# Patient Record
Sex: Female | Born: 1960
Health system: Southern US, Community
[De-identification: ages and names within clinical notes are randomized; demographics above are authoritative.]

## PROBLEM LIST (undated history)

## (undated) DIAGNOSIS — M1990 Unspecified osteoarthritis, unspecified site: Secondary | ICD-10-CM

## (undated) DIAGNOSIS — E0590 Thyrotoxicosis, unspecified without thyrotoxic crisis or storm: Secondary | ICD-10-CM

## (undated) DIAGNOSIS — E079 Disorder of thyroid, unspecified: Secondary | ICD-10-CM

## (undated) DIAGNOSIS — D649 Anemia, unspecified: Secondary | ICD-10-CM

## (undated) DIAGNOSIS — G4730 Sleep apnea, unspecified: Secondary | ICD-10-CM

## (undated) DIAGNOSIS — N611 Abscess of the breast and nipple: Secondary | ICD-10-CM

## (undated) DIAGNOSIS — R011 Cardiac murmur, unspecified: Secondary | ICD-10-CM

## (undated) DIAGNOSIS — E039 Hypothyroidism, unspecified: Secondary | ICD-10-CM

## (undated) DIAGNOSIS — M810 Age-related osteoporosis without current pathological fracture: Secondary | ICD-10-CM

## (undated) HISTORY — DX: Unspecified osteoarthritis, unspecified site: M19.90

## (undated) HISTORY — DX: Disorder of thyroid, unspecified: E07.9

## (undated) HISTORY — DX: Age-related osteoporosis without current pathological fracture: M81.0

## (undated) HISTORY — PX: APPENDECTOMY: SHX54

---

## 2010-04-15 ENCOUNTER — Emergency Department (HOSPITAL_COMMUNITY): Admission: EM | Admit: 2010-04-15 | Discharge: 2010-04-15 | Payer: Self-pay | Admitting: Emergency Medicine

## 2010-10-14 ENCOUNTER — Emergency Department (HOSPITAL_COMMUNITY)
Admission: EM | Admit: 2010-10-14 | Discharge: 2010-10-15 | Payer: Self-pay | Source: Home / Self Care | Admitting: Emergency Medicine

## 2010-10-16 LAB — RAPID STREP SCREEN (MED CTR MEBANE ONLY): Streptococcus, Group A Screen (Direct): NEGATIVE

## 2011-01-16 ENCOUNTER — Emergency Department (HOSPITAL_COMMUNITY)
Admission: EM | Admit: 2011-01-16 | Discharge: 2011-01-16 | Disposition: A | Discharge status: Home / Self Care | Payer: Self-pay | Attending: Emergency Medicine | Admitting: Emergency Medicine

## 2011-01-16 DIAGNOSIS — S40269A Insect bite (nonvenomous) of unspecified shoulder, initial encounter: Secondary | ICD-10-CM | POA: Insufficient documentation

## 2011-01-16 DIAGNOSIS — W57XXXA Bitten or stung by nonvenomous insect and other nonvenomous arthropods, initial encounter: Secondary | ICD-10-CM | POA: Insufficient documentation

## 2014-10-25 ENCOUNTER — Telehealth: Payer: Self-pay | Admitting: Family Medicine

## 2014-10-26 NOTE — Telephone Encounter (Signed)
Pt needs to establish care, pt aware to arrive 15 minutes prior to appt with insurance card and a list of current medications, pt has been using the ER whenever she has problems. Pt given appt with dr Sabra Heck 1/28 at 1:00.

## 2014-10-28 ENCOUNTER — Encounter: Payer: Self-pay | Admitting: Family Medicine

## 2014-10-28 ENCOUNTER — Ambulatory Visit (INDEPENDENT_AMBULATORY_CARE_PROVIDER_SITE_OTHER): Payer: 59 | Admitting: Family Medicine

## 2014-10-28 ENCOUNTER — Encounter (INDEPENDENT_AMBULATORY_CARE_PROVIDER_SITE_OTHER): Payer: Self-pay

## 2014-10-28 VITALS — BP 118/73 | HR 60 | Temp 97.5°F | Ht 64.0 in | Wt 236.0 lb

## 2014-10-28 DIAGNOSIS — Z0000 Encounter for general adult medical examination without abnormal findings: Secondary | ICD-10-CM

## 2014-10-28 NOTE — Progress Notes (Signed)
   Subjective:    Patient ID: Makayla Gibson, female    DOB: 1960-10-30, 54 y.o.   MRN: [id number]  HPI  54 year old female comes in today to establish care. She is new to our practice and brought a few records with her. She needs a physical exam and forms filled out that will allow her to return to work.  She has worked as a Psychologist, counselling at [employer]. When she is asked to do a lot of walking and heavy lifting of patients she has pain in her back and feet and legs. Most of the time she works in [employer] and is comfortable doing that job. She brings with her job description and we have been over these forms. She has assisted me and telling me what she is comfortable doing in my exam basically supports her history and limitations.   Outpatient Encounter Prescriptions as of 10/28/2014  Medication Sig  . Multiple Vitamin (MULTIVITAMIN WITH MINERALS) TABS tablet Take 1 tablet by mouth daily.    Review of Systems  Constitutional: Negative.   Cardiovascular: Negative.   Gastrointestinal: Negative.   Musculoskeletal: Positive for myalgias and back pain.  Neurological: Negative.   Psychiatric/Behavioral: Negative.        Objective:   Physical Exam  Constitutional: She is oriented to person, place, and time. She appears well-developed and well-nourished.  HENT:  Head: Normocephalic.  Eyes: Pupils are equal, round, and reactive to light.  Neck: Normal range of motion. Neck supple.  Cardiovascular: Normal rate and regular rhythm.   Pulmonary/Chest: Effort normal and breath sounds normal.  Musculoskeletal: Normal range of motion.  Neurological: She is alert and oriented to person, place, and time.  Skin: Skin is warm and dry.  Psychiatric: She has a normal mood and affect. Her behavior is normal. Judgment and thought content normal.    BP 118/73 mmHg  Pulse 60  Temp(Src) 97.5 F (36.4 C) (Oral)  Ht 5\' 4"  (1.626 m)  Wt 236 lb (107.049 kg)  BMI 40.49 kg/m2         Assessment & Plan:  1. 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 We went through forms and decide what she is afebrile and not able to do based on her history. She is amazingly free of chronic diseases. She tells me she's not had Pap smear or mammogram like to get her set up for that in the near future  Wardell Honour MD

## 2014-11-30 DIAGNOSIS — E079 Disorder of thyroid, unspecified: Secondary | ICD-10-CM

## 2014-11-30 HISTORY — DX: Disorder of thyroid, unspecified: E07.9

## 2015-01-10 DIAGNOSIS — J9612 Chronic respiratory failure with hypercapnia: Secondary | ICD-10-CM | POA: Insufficient documentation

## 2015-01-12 ENCOUNTER — Ambulatory Visit (INDEPENDENT_AMBULATORY_CARE_PROVIDER_SITE_OTHER): Payer: 59 | Admitting: Family Medicine

## 2015-01-12 ENCOUNTER — Encounter: Payer: Self-pay | Admitting: Family Medicine

## 2015-01-12 VITALS — BP 117/74 | HR 81 | Temp 98.8°F | Ht 64.0 in | Wt 216.4 lb

## 2015-01-12 DIAGNOSIS — R7989 Other specified abnormal findings of blood chemistry: Secondary | ICD-10-CM | POA: Diagnosis not present

## 2015-01-12 DIAGNOSIS — E038 Other specified hypothyroidism: Secondary | ICD-10-CM | POA: Diagnosis not present

## 2015-01-12 DIAGNOSIS — R945 Abnormal results of liver function studies: Secondary | ICD-10-CM

## 2015-01-12 DIAGNOSIS — R160 Hepatomegaly, not elsewhere classified: Secondary | ICD-10-CM

## 2015-01-12 DIAGNOSIS — D509 Iron deficiency anemia, unspecified: Secondary | ICD-10-CM | POA: Diagnosis not present

## 2015-01-12 LAB — POCT CBC
Granulocyte percent: 71.8 %G (ref 37–80)
HCT, POC: 38 % (ref 37.7–47.9)
Hemoglobin: 11.5 g/dL — AB (ref 12.2–16.2)
Lymph, poc: 1.5 (ref 0.6–3.4)
MCH, POC: 29.1 pg (ref 27–31.2)
MCHC: 30.3 g/dL — AB (ref 31.8–35.4)
MCV: 96.1 fL (ref 80–97)
MPV: 7.4 fL (ref 0–99.8)
POC Granulocyte: 5.2 (ref 2–6.9)
POC LYMPH PERCENT: 20.7 %L (ref 10–50)
Platelet Count, POC: 335 10*3/uL (ref 142–424)
RBC: 3.96 M/uL — AB (ref 4.04–5.48)
RDW, POC: 16 %
WBC: 7.3 10*3/uL (ref 4.6–10.2)

## 2015-01-12 NOTE — Progress Notes (Signed)
Subjective:  Patient ID: Makayla Gibson, female    DOB: Jan 30, 1961  Age: 54 y.o. MRN: [id number]  CC: Hospitalization Follow-up   HPI VERDINE Gibson presents for recent hospital admission. Apparently she became obtunded and combative in the home on about March 25. She also became delirious and was taken to the hospital at [facility]. From there she was transferred to [facility]. There she was treated for an underactive thyroid. She was also found to have significant anemia. She was given transfusion and started on iron. She also is taking thyroid medication currently. While at [facility] she received a CT angiogram of her chest which showed no sign of pulmonary embolus however she was noted to have pleural effusions and ascites as well as body wall edema indicative of anasarca. She had an abdominal ultrasound showing hepatomegaly. The CT scan showed some nodularity in the liver but no conclusive evidence for tumor. A follow-up CT hepatic protocol was recommended on that test. Review of [facility] records also revealed hemoglobin down to 8.  Currently her thyroid has stabilized on current medication titrated at [facility]. Her energy is better but still poor. The delirium resolved while in the hospital and she is lucid with no complaint of confusion currently.   History [name] has a past medical history of Arthritis.   She has past surgical history that includes Cesarean section.   Her family history includes Alcohol abuse in her father; Cancer in her brother; Heart disease in her mother; Osteoporosis in her brother; Rheumatic fever in her mother.She reports that she quit smoking about 6 years ago. She has never used smokeless tobacco. She reports that she does not drink alcohol or use illicit drugs.  Current Outpatient Prescriptions on File Prior to Visit  Medication Sig Dispense Refill  . Multiple Vitamin (MULTIVITAMIN WITH MINERALS) TABS tablet Take 1 tablet by mouth daily.      No current facility-administered medications on file prior to visit.    ROS Review of Systems  Constitutional: Negative for fever, chills, diaphoresis, appetite change, fatigue and unexpected weight change.  HENT: Negative for congestion, ear pain, hearing loss, postnasal drip, rhinorrhea, sneezing, sore throat and trouble swallowing.   Eyes: Negative for pain.  Respiratory: Negative for cough, chest tightness and shortness of breath.   Cardiovascular: Negative for chest pain and palpitations.  Gastrointestinal: Negative for nausea, vomiting, abdominal pain, diarrhea and constipation.  Genitourinary: Negative for dysuria, frequency and menstrual problem.  Musculoskeletal: Negative for joint swelling and arthralgias.  Skin: Negative for rash.  Neurological: Negative for dizziness, weakness, numbness and headaches.  Psychiatric/Behavioral: Negative for dysphoric mood and agitation.    Objective:  BP 117/74 mmHg  Pulse 81  Temp(Src) 98.8 F (37.1 C) (Oral)  Ht $R'5\' 4"'wg$  (1.626 m)  Wt 216 lb 6.4 oz (98.158 kg)  BMI 37.13 kg/m2  BP Readings from Last 3 Encounters:  01/12/15 117/74  10/28/14 118/73    Wt Readings from Last 3 Encounters:  01/12/15 216 lb 6.4 oz (98.158 kg)  10/28/14 236 lb (107.049 kg)     Physical Exam  Constitutional: She is oriented to person, place, and time. She appears well-developed and well-nourished. No distress.  HENT:  Head: Normocephalic and atraumatic.  Right Ear: External ear normal.  Left Ear: External ear normal.  Nose: Nose normal.  Mouth/Throat: Oropharynx is clear and moist.  Eyes: Conjunctivae and EOM are normal. Pupils are equal, round, and reactive to light.  Neck: Normal range of motion. Neck supple.  No thyromegaly present.  Cardiovascular: Normal rate, regular rhythm and normal heart sounds.   No murmur heard. Pulmonary/Chest: Effort normal and breath sounds normal. No respiratory distress. She has no wheezes. She has no rales.    Abdominal: Soft. Bowel sounds are normal. She exhibits distension. She exhibits no mass. There is no tenderness. There is no rebound and no guarding.  Lymphadenopathy:    She has no cervical adenopathy.  Neurological: She is alert and oriented to person, place, and time. She has normal reflexes.  Skin: Skin is warm and dry.  Psychiatric: She has a normal mood and affect. Her behavior is normal. Judgment and thought content normal.    No results found for: HGBA1C  Lab Results  Component Value Date   WBC 7.3 01/12/2015   HGB 11.5* 01/12/2015   HCT 38.0 01/12/2015   GLUCOSE 96 01/12/2015   CHOL 266* 01/12/2015   TRIG 223* 01/12/2015   HDL 29* 01/12/2015   LDLCALC 192* 01/12/2015   ALT 25 01/12/2015   AST 16 01/12/2015   NA 141 01/12/2015   K 3.8 01/12/2015   CL 95* 01/12/2015   CREATININE 0.57 01/12/2015   BUN 28* 01/12/2015   CO2 27 01/12/2015   TSH 21.950* 01/12/2015    No results found.  Assessment & Plan:   Ann was seen today for hospitalization follow-up.  Diagnoses and all orders for this visit:  Other specified hypothyroidism Orders: -     Thyroid Panel With TSH -     T4, free  Elevated LFTs Orders: -     CMP14+EGFR -     Lipid panel -     CT Abd Wo & W Cm; Future  Iron deficiency anemia Orders: -     POCT CBC  Hepatomegaly Orders: -     CT Abd Wo & W Cm; Future   I am having Ms. Castillo maintain her multivitamin with minerals, levothyroxine, potassium chloride, and furosemide.  Meds ordered this encounter  Medications  . levothyroxine (SYNTHROID, LEVOTHROID) 125 MCG tablet    Sig: Take 125 mcg by mouth daily.  . potassium chloride (K-DUR,KLOR-CON) 10 MEQ tablet    Sig: Take 10 mEq by mouth daily.  . furosemide (LASIX) 40 MG tablet    Sig: Take 40 mg by mouth daily.     Follow-up: Return in about 2 weeks (around 01/26/2015).  Claretta Fraise, M.D.

## 2015-01-13 LAB — CMP14+EGFR
ALT: 25 IU/L (ref 0–32)
AST: 16 IU/L (ref 0–40)
Albumin/Globulin Ratio: 1.7 (ref 1.1–2.5)
Albumin: 4.5 g/dL (ref 3.5–5.5)
Alkaline Phosphatase: 56 IU/L (ref 39–117)
BUN/Creatinine Ratio: 49 — ABNORMAL HIGH (ref 9–23)
BUN: 28 mg/dL — ABNORMAL HIGH (ref 6–24)
Bilirubin Total: 0.4 mg/dL (ref 0.0–1.2)
CO2: 27 mmol/L (ref 18–29)
Calcium: 10.3 mg/dL — ABNORMAL HIGH (ref 8.7–10.2)
Chloride: 95 mmol/L — ABNORMAL LOW (ref 97–108)
Creatinine, Ser: 0.57 mg/dL (ref 0.57–1.00)
GFR calc Af Amer: 122 mL/min/{1.73_m2} (ref >59)
GFR calc non Af Amer: 106 mL/min/{1.73_m2} (ref >59)
Globulin, Total: 2.6 g/dL (ref 1.5–4.5)
Glucose: 96 mg/dL (ref 65–99)
Potassium: 3.8 mmol/L (ref 3.5–5.2)
Sodium: 141 mmol/L (ref 134–144)
Total Protein: 7.1 g/dL (ref 6.0–8.5)

## 2015-01-13 LAB — THYROID PANEL WITH TSH
Free Thyroxine Index: 2.4 (ref 1.2–4.9)
T3 Uptake Ratio: 28 % (ref 24–39)
T4, Total: 8.5 ug/dL (ref 4.5–12.0)
TSH: 21.95 u[IU]/mL — ABNORMAL HIGH (ref 0.450–4.500)

## 2015-01-13 LAB — LIPID PANEL
Chol/HDL Ratio: 9.2 ratio units — ABNORMAL HIGH (ref 0.0–4.4)
Cholesterol, Total: 266 mg/dL — ABNORMAL HIGH (ref 100–199)
HDL: 29 mg/dL — ABNORMAL LOW (ref >39)
LDL Calculated: 192 mg/dL — ABNORMAL HIGH (ref 0–99)
Triglycerides: 223 mg/dL — ABNORMAL HIGH (ref 0–149)
VLDL Cholesterol Cal: 45 mg/dL — ABNORMAL HIGH (ref 5–40)

## 2015-01-13 LAB — T4, FREE: Free T4: 1.2 ng/dL (ref 0.82–1.77)

## 2015-01-14 ENCOUNTER — Other Ambulatory Visit: Payer: Self-pay | Admitting: Family Medicine

## 2015-01-14 MED ORDER — ATORVASTATIN CALCIUM 40 MG PO TABS: 40.0000 mg | ORAL_TABLET | Freq: Every day | ORAL | Status: DC

## 2015-01-24 ENCOUNTER — Telehealth: Payer: Self-pay | Admitting: Family Medicine

## 2015-01-24 NOTE — Telephone Encounter (Signed)
Patient aware note is fixed and ready to be picked up.

## 2015-01-27 ENCOUNTER — Other Ambulatory Visit: Payer: Self-pay | Admitting: Family Medicine

## 2015-01-27 ENCOUNTER — Ambulatory Visit (INDEPENDENT_AMBULATORY_CARE_PROVIDER_SITE_OTHER): Payer: 59 | Admitting: Family Medicine

## 2015-01-27 ENCOUNTER — Encounter: Payer: Self-pay | Admitting: Family Medicine

## 2015-01-27 VITALS — BP 118/77 | HR 89 | Temp 98.3°F | Ht 64.0 in | Wt 214.6 lb

## 2015-01-27 DIAGNOSIS — E038 Other specified hypothyroidism: Secondary | ICD-10-CM | POA: Diagnosis not present

## 2015-01-27 DIAGNOSIS — D509 Iron deficiency anemia, unspecified: Secondary | ICD-10-CM | POA: Insufficient documentation

## 2015-01-27 DIAGNOSIS — R0600 Dyspnea, unspecified: Secondary | ICD-10-CM | POA: Diagnosis not present

## 2015-01-27 DIAGNOSIS — R609 Edema, unspecified: Secondary | ICD-10-CM

## 2015-01-27 LAB — POCT CBC
Granulocyte percent: 62.8 %G (ref 37–80)
HCT, POC: 35 % — AB (ref 37.7–47.9)
Hemoglobin: 11 g/dL — AB (ref 12.2–16.2)
Lymph, poc: 1.7 (ref 0.6–3.4)
MCH, POC: 29.5 pg (ref 27–31.2)
MCHC: 31.3 g/dL — AB (ref 31.8–35.4)
MCV: 94.2 fL (ref 80–97)
MPV: 7.4 fL (ref 0–99.8)
POC Granulocyte: 5.2 (ref 2–6.9)
POC LYMPH PERCENT: 20.8 %L (ref 10–50)
Platelet Count, POC: 260 10*3/uL (ref 142–424)
RBC: 3.72 M/uL — AB (ref 4.04–5.48)
RDW, POC: 15.7 %
WBC: 8.3 10*3/uL (ref 4.6–10.2)

## 2015-01-27 MED ORDER — FERROUS FUMARATE 324 (106 FE) MG PO TABS: 1.0000 | ORAL_TABLET | Freq: Two times a day (BID) | ORAL | Status: DC

## 2015-01-27 NOTE — Progress Notes (Signed)
Subjective:  Patient ID: Makayla Gibson, female    DOB: [birth year]  Age: 54 y.o. MRN: [id number]  CC: Hypothyroidism and Anemia   HPI Makayla Gibson presents for Patient presents for follow-up on  thyroid. She has a history of hypothyroidism recently. Pt. denies any change in  voice, loss of hair, heat or cold intolerance. Energy level has been adequate to good. She denies constipation and diarrhea. No myxedema. Medication is as noted below. Taking it now for 2-3 weeks only. Verified that pt is taking it daily on an empty stomach. Well tolerated.  Patient states that she never started on the iron pills. She wasn't sure how to take them. She wasn't sure what kind to get.  Patient in for follow-up of elevated cholesterol. Doing well without complaints on current medication. Denies side effects of statin including myalgia and arthralgia and nausea. Also in today for liver function testing. Currently no chest pain, shortness of breath or other cardiovascular related symptoms noted.  Patient has noted increasing edema in the legs and admits to shortness of breath as well. This is primary with mild-to-moderate exertion or more. She has some fatigue. That seems to be getting better since starting on the thyroid medicine. She's had no palpitations.   History Holiday has a past medical history of Arthritis.   She has past surgical history that includes Cesarean section.   Her family history includes Alcohol abuse in her father; Cancer in her brother; Heart disease in her mother; Osteoporosis in her brother; Rheumatic fever in her mother.She reports that she quit smoking about 6 years ago. She has never used smokeless tobacco. She reports that she does not drink alcohol or use illicit drugs.  Current Outpatient Prescriptions on File Prior to Visit  Medication Sig Dispense Refill  . atorvastatin (LIPITOR) 40 MG tablet Take 1 tablet (40 mg total) by mouth daily. Take for cholesterol control  90 tablet 3  . furosemide (LASIX) 40 MG tablet Take 40 mg by mouth daily.    . potassium chloride (K-DUR,KLOR-CON) 10 MEQ tablet Take 10 mEq by mouth daily.    . Multiple Vitamin (MULTIVITAMIN WITH MINERALS) TABS tablet Take 1 tablet by mouth daily.     No current facility-administered medications on file prior to visit.    ROS Review of Systems  Constitutional: Negative for fever, chills, diaphoresis, appetite change, fatigue and unexpected weight change.  HENT: Negative for congestion, ear pain, hearing loss, postnasal drip, rhinorrhea, sneezing, sore throat and trouble swallowing.   Eyes: Negative for pain.  Respiratory: Positive for shortness of breath. Negative for cough and chest tightness.   Cardiovascular: Positive for leg swelling. Negative for chest pain and palpitations.  Gastrointestinal: Negative for nausea, vomiting, abdominal pain, diarrhea and constipation.  Genitourinary: Negative for dysuria, frequency and menstrual problem.  Musculoskeletal: Negative for joint swelling and arthralgias.  Skin: Negative for rash.  Neurological: Negative for dizziness, weakness, numbness and headaches.  Psychiatric/Behavioral: Negative for dysphoric mood and agitation.    Objective:  BP 118/77 mmHg  Pulse 89  Temp(Src) 98.3 F (36.8 C) (Oral)  Ht 5' 4" (1.626 m)  Wt 214 lb 9.6 oz (97.342 kg)  BMI 36.82 kg/m2  BP Readings from Last 3 Encounters:  01/27/15 118/77  01/12/15 117/74  10/28/14 118/73    Wt Readings from Last 3 Encounters:  01/27/15 214 lb 9.6 oz (97.342 kg)  01/12/15 216 lb 6.4 oz (98.158 kg)  10/28/14 236 lb (107.049 kg)     Physical  Exam  Constitutional: She is oriented to person, place, and time. She appears well-developed and well-nourished. No distress.  HENT:  Head: Normocephalic and atraumatic.  Right Ear: External ear normal.  Left Ear: External ear normal.  Nose: Nose normal.  Mouth/Throat: Oropharynx is clear and moist.  Eyes: Conjunctivae and  EOM are normal. Pupils are equal, round, and reactive to light.  Neck: Normal range of motion. Neck supple. No thyromegaly present.  Cardiovascular: Normal rate, regular rhythm and normal heart sounds.   No murmur heard. Pulmonary/Chest: Effort normal and breath sounds normal. No respiratory distress. She has no wheezes. She has no rales.  Abdominal: Soft. Bowel sounds are normal. She exhibits no distension. There is no tenderness.  Musculoskeletal: She exhibits edema (1-2+ ankle edema).  Lymphadenopathy:    She has no cervical adenopathy.  Neurological: She is alert and oriented to person, place, and time. She has normal reflexes.  Skin: Skin is warm and dry.  Psychiatric: She has a normal mood and affect. Her behavior is normal. Judgment and thought content normal.    No results found for: HGBA1C  Lab Results  Component Value Date   WBC 8.3 01/27/2015   HGB 11.0* 01/27/2015   HCT 35.0* 01/27/2015   GLUCOSE 96 01/12/2015   CHOL 266* 01/12/2015   TRIG 223* 01/12/2015   HDL 29* 01/12/2015   LDLCALC 192* 01/12/2015   ALT 25 01/12/2015   AST 16 01/12/2015   NA 141 01/12/2015   K 3.8 01/12/2015   CL 95* 01/12/2015   CREATININE 0.57 01/12/2015   BUN 28* 01/12/2015   CO2 27 01/12/2015   TSH 21.950* 01/12/2015    No results found.  Assessment & Plan:   Sentoria was seen today for hypothyroidism and anemia.  Diagnoses and all orders for this visit:  Other specified hypothyroidism Orders: -     CMP14+EGFR  Iron deficiency anemia Orders: -     Ferrous Fumarate (HEMOCYTE) 324 (106 FE) MG TABS; Take 1 tablet by mouth 2 (two) times daily. -     POCT CBC -     CMP14+EGFR  Dyspnea Orders: -     Brain natriuretic peptide -     CMP14+EGFR  Edema Orders: -     Brain natriuretic peptide -     CMP14+EGFR   I am having Ms. Harner start on Ferrous Fumarate. I am also having her maintain her multivitamin with minerals, potassium chloride, furosemide, atorvastatin, and  levothyroxine.  Meds ordered this encounter  Medications  . levothyroxine (SYNTHROID, LEVOTHROID) 100 MCG tablet    Sig: Take 100 mcg by mouth daily.  . Ferrous Fumarate (HEMOCYTE) 324 (106 FE) MG TABS    Sig: Take 1 tablet by mouth 2 (two) times daily.    Dispense:  60 tablet    Refill:  5     Follow-up: Return in about 6 weeks (around 03/10/2015) for cholesterol& anemia.  Claretta Fraise, M.D.

## 2015-01-28 LAB — CMP14+EGFR
ALT: 17 IU/L (ref 0–32)
AST: 17 IU/L (ref 0–40)
Albumin/Globulin Ratio: 1.4 (ref 1.1–2.5)
Albumin: 3.6 g/dL (ref 3.5–5.5)
Alkaline Phosphatase: 50 IU/L (ref 39–117)
BUN/Creatinine Ratio: 25 — ABNORMAL HIGH (ref 9–23)
BUN: 14 mg/dL (ref 6–24)
Bilirubin Total: 0.3 mg/dL (ref 0.0–1.2)
CO2: 28 mmol/L (ref 18–29)
Calcium: 9.8 mg/dL (ref 8.7–10.2)
Chloride: 95 mmol/L — ABNORMAL LOW (ref 97–108)
Creatinine, Ser: 0.56 mg/dL — ABNORMAL LOW (ref 0.57–1.00)
GFR calc Af Amer: 123 mL/min/{1.73_m2} (ref >59)
GFR calc non Af Amer: 107 mL/min/{1.73_m2} (ref >59)
Globulin, Total: 2.5 g/dL (ref 1.5–4.5)
Glucose: 93 mg/dL (ref 65–99)
Potassium: 4 mmol/L (ref 3.5–5.2)
Sodium: 138 mmol/L (ref 134–144)
Total Protein: 6.1 g/dL (ref 6.0–8.5)

## 2015-01-28 LAB — BRAIN NATRIURETIC PEPTIDE: BNP: 56.3 pg/mL (ref 0.0–100.0)

## 2015-02-08 ENCOUNTER — Other Ambulatory Visit: Payer: Self-pay | Admitting: Family Medicine

## 2015-02-08 MED ORDER — FUROSEMIDE 40 MG PO TABS: 40.0000 mg | ORAL_TABLET | Freq: Every day | ORAL | Status: DC

## 2015-02-08 NOTE — Telephone Encounter (Signed)
Done, please tell patient 

## 2015-02-09 NOTE — Telephone Encounter (Signed)
Patient aware.

## 2015-03-16 ENCOUNTER — Other Ambulatory Visit (INDEPENDENT_AMBULATORY_CARE_PROVIDER_SITE_OTHER): Payer: 59

## 2015-03-16 DIAGNOSIS — R799 Abnormal finding of blood chemistry, unspecified: Secondary | ICD-10-CM | POA: Diagnosis not present

## 2015-03-17 LAB — CMP14+EGFR
ALT: 16 IU/L (ref 0–32)
AST: 17 IU/L (ref 0–40)
Albumin/Globulin Ratio: 1.6 (ref 1.1–2.5)
Albumin: 3.8 g/dL (ref 3.5–5.5)
Alkaline Phosphatase: 57 IU/L (ref 39–117)
BUN/Creatinine Ratio: 33 — ABNORMAL HIGH (ref 9–23)
BUN: 15 mg/dL (ref 6–24)
Bilirubin Total: 0.3 mg/dL (ref 0.0–1.2)
CO2: 27 mmol/L (ref 18–29)
Calcium: 9.9 mg/dL (ref 8.7–10.2)
Chloride: 102 mmol/L (ref 97–108)
Creatinine, Ser: 0.46 mg/dL — ABNORMAL LOW (ref 0.57–1.00)
GFR calc Af Amer: 131 mL/min/{1.73_m2} (ref >59)
GFR calc non Af Amer: 114 mL/min/{1.73_m2} (ref >59)
Globulin, Total: 2.4 g/dL (ref 1.5–4.5)
Glucose: 99 mg/dL (ref 65–99)
Potassium: 3.7 mmol/L (ref 3.5–5.2)
Sodium: 145 mmol/L — ABNORMAL HIGH (ref 134–144)
Total Protein: 6.2 g/dL (ref 6.0–8.5)

## 2015-03-21 ENCOUNTER — Ambulatory Visit (INDEPENDENT_AMBULATORY_CARE_PROVIDER_SITE_OTHER): Payer: 59 | Admitting: Family Medicine

## 2015-03-21 ENCOUNTER — Encounter: Payer: Self-pay | Admitting: Family Medicine

## 2015-03-21 VITALS — BP 92/57 | HR 62 | Temp 97.6°F | Ht 64.0 in | Wt 200.6 lb

## 2015-03-21 DIAGNOSIS — D509 Iron deficiency anemia, unspecified: Secondary | ICD-10-CM | POA: Diagnosis not present

## 2015-03-21 DIAGNOSIS — E038 Other specified hypothyroidism: Secondary | ICD-10-CM | POA: Diagnosis not present

## 2015-03-21 LAB — POCT CBC
Granulocyte percent: 70.4 %G (ref 37–80)
HCT, POC: 36.1 % — AB (ref 37.7–47.9)
Hemoglobin: 10.7 g/dL — AB (ref 12.2–16.2)
Lymph, poc: 1.7 (ref 0.6–3.4)
MCH, POC: 26.2 pg — AB (ref 27–31.2)
MCHC: 29.6 g/dL — AB (ref 31.8–35.4)
MCV: 88.5 fL (ref 80–97)
MPV: 8 fL (ref 0–99.8)
POC Granulocyte: 5 (ref 2–6.9)
POC LYMPH PERCENT: 23.8 %L (ref 10–50)
Platelet Count, POC: 294 10*3/uL (ref 142–424)
RBC: 4.07 M/uL (ref 4.04–5.48)
RDW, POC: 16.5 %
WBC: 7.1 10*3/uL (ref 4.6–10.2)

## 2015-03-21 NOTE — Progress Notes (Signed)
Subjective:  Patient ID: Makayla Gibson, female    DOB: [birth year]  Age: 54 y.o. MRN: [id number]  CC: Hypothyroidism   HPI Makayla Gibson presents for Patient presents for follow-up on  thyroid. She has a history of hypothyroidism for many years. It has been increased recently. Pt. denies any change in  voice, loss of hair, heat or cold intolerance. Energy level has been adequate to good. She denies constipation and diarrhea. No myxedema. Medication is as noted below. Verified that pt is taking it daily on an empty stomach. Well tolerated. Due also for CBC due to anemia. No symptoms recently of weakness, feeling cold or melena  History Makayla Gibson has a past medical history of Arthritis and Thyroid disease (11/2014).   She has past surgical history that includes Cesarean section.   Her family history includes Alcohol abuse in her father; Cancer in her brother; Heart disease in her mother; Osteoporosis in her brother; Rheumatic fever in her mother.She reports that she quit smoking about 6 years ago. She has never used smokeless tobacco. She reports that she does not drink alcohol or use illicit drugs.  Outpatient Prescriptions Prior to Visit  Medication Sig Dispense Refill  . atorvastatin (LIPITOR) 40 MG tablet Take 1 tablet (40 mg total) by mouth daily. Take for cholesterol control 90 tablet 3  . Ferrous Fumarate (HEMOCYTE) 324 (106 FE) MG TABS Take 1 tablet by mouth 2 (two) times daily. 60 tablet 5  . furosemide (LASIX) 40 MG tablet Take 1 tablet (40 mg total) by mouth daily. 30 tablet 2  . levothyroxine (SYNTHROID, LEVOTHROID) 100 MCG tablet Take 100 mcg by mouth daily.    . Multiple Vitamin (MULTIVITAMIN WITH MINERALS) TABS tablet Take 1 tablet by mouth daily.    . potassium chloride (K-DUR,KLOR-CON) 10 MEQ tablet Take 10 mEq by mouth daily.     No facility-administered medications prior to visit.    ROS Review of Systems  Constitutional: Negative for fever, chills,  diaphoresis, appetite change, fatigue and unexpected weight change.  HENT: Negative for congestion, ear pain, hearing loss, postnasal drip, rhinorrhea, sneezing, sore throat and trouble swallowing.   Eyes: Negative for pain.  Respiratory: Negative for cough, chest tightness and shortness of breath.   Cardiovascular: Negative for chest pain and palpitations.  Gastrointestinal: Negative for nausea, vomiting, abdominal pain, diarrhea and constipation.  Genitourinary: Negative for dysuria, frequency and menstrual problem.  Musculoskeletal: Negative for joint swelling and arthralgias.  Skin: Negative for rash.  Neurological: Negative for dizziness, weakness, numbness and headaches.  Psychiatric/Behavioral: Negative for dysphoric mood and agitation.    Objective:  BP 92/57 mmHg  Pulse 62  Temp(Src) 97.6 F (36.4 C) (Oral)  Ht 5\' 4"  (1.626 m)  Wt 200 lb 9.6 oz (90.992 kg)  BMI 34.42 kg/m2  BP Readings from Last 3 Encounters:  03/21/15 92/57  01/27/15 118/77  01/12/15 117/74    Wt Readings from Last 3 Encounters:  03/21/15 200 lb 9.6 oz (90.992 kg)  01/27/15 214 lb 9.6 oz (97.342 kg)  01/12/15 216 lb 6.4 oz (98.158 kg)     Physical Exam  Constitutional: She is oriented to person, place, and time. She appears well-developed and well-nourished. No distress.  HENT:  Head: Normocephalic and atraumatic.  Right Ear: External ear normal.  Left Ear: External ear normal.  Nose: Nose normal.  Mouth/Throat: Oropharynx is clear and moist.  Eyes: Conjunctivae and EOM are normal. Pupils are equal, round, and reactive to light.  Neck: Normal range  of motion. Neck supple. No thyromegaly present.  Cardiovascular: Normal rate, regular rhythm and normal heart sounds.   No murmur heard. Pulmonary/Chest: Effort normal and breath sounds normal. No respiratory distress. She has no wheezes. She has no rales.  Abdominal: Soft. Bowel sounds are normal. She exhibits no distension. There is no  tenderness.  Lymphadenopathy:    She has no cervical adenopathy.  Neurological: She is alert and oriented to person, place, and time. She has normal reflexes.  Skin: Skin is warm and dry.  Psychiatric: She has a normal mood and affect. Her behavior is normal. Judgment and thought content normal.    No results found for: HGBA1C  Lab Results  Component Value Date   WBC 7.1 03/21/2015   HGB 10.7* 03/21/2015   HCT 36.1* 03/21/2015   GLUCOSE 99 03/16/2015   CHOL 266* 01/12/2015   TRIG 223* 01/12/2015   HDL 29* 01/12/2015   LDLCALC 192* 01/12/2015   ALT 16 03/16/2015   AST 17 03/16/2015   NA 145* 03/16/2015   K 3.7 03/16/2015   CL 102 03/16/2015   CREATININE 0.46* 03/16/2015   BUN 15 03/16/2015   CO2 27 03/16/2015   TSH 4.040 03/21/2015    No results found.  Assessment & Plan:   Makayla Gibson was seen today for hypothyroidism.  Diagnoses and all orders for this visit:  Iron deficiency anemia Orders: -     T4, free -     TSH -     POCT CBC  Other specified hypothyroidism Orders: -     T4, free -     TSH -     POCT CBC  I am having Makayla Gibson maintain her multivitamin with minerals, atorvastatin, levothyroxine, Ferrous Fumarate, and furosemide.  No orders of the defined types were placed in this encounter.     Follow-up: Return in about 6 months (around 09/20/2015).  Makayla Gibson, M.D.

## 2015-03-22 ENCOUNTER — Telehealth: Payer: Self-pay | Admitting: Family Medicine

## 2015-03-22 LAB — TSH: TSH: 4.04 u[IU]/mL (ref 0.450–4.500)

## 2015-03-22 LAB — T4, FREE: Free T4: 1.25 ng/dL (ref 0.82–1.77)

## 2015-03-22 MED ORDER — POTASSIUM CHLORIDE CRYS ER 10 MEQ PO TBCR: 10.0000 meq | EXTENDED_RELEASE_TABLET | Freq: Every day | ORAL | Status: DC

## 2015-03-22 NOTE — Telephone Encounter (Signed)
Please advise 

## 2015-03-22 NOTE — Telephone Encounter (Signed)
I renewed her existing potassium order. Let her know, please. WS

## 2015-05-01 NOTE — Progress Notes (Signed)
Subjective:  Patient ID: Makayla Gibson, female    DOB: 27-Apr-1961  Age: 54 y.o. MRN: [id number]  CC: Breast Mass   HPI Makayla Gibson presents for concern about recently discovered lump in breast. Very painful. Noticed 2 weeks ago when she brushed her arm against it. Increasing pain daily since. Now tender to touch with 6/10 pain. No hx of breast cancer personally or family. She denies any discharge or redness. History Makayla Gibson has a past medical history of Arthritis and Thyroid disease (11/2014).   She has past surgical history that includes Cesarean section.   Her family history includes Alcohol abuse in her father; Cancer in her brother; Heart disease in her mother; Osteoporosis in her brother; Rheumatic fever in her mother.She reports that she quit smoking about 6 years ago. She has never used smokeless tobacco. She reports that she does not drink alcohol or use illicit drugs.  Outpatient Prescriptions Prior to Visit  Medication Sig Dispense Refill  . atorvastatin (LIPITOR) 40 MG tablet Take 1 tablet (40 mg total) by mouth daily. Take for cholesterol control 90 tablet 3  . Ferrous Fumarate (HEMOCYTE) 324 (106 FE) MG TABS Take 1 tablet by mouth 2 (two) times daily. 60 tablet 5  . furosemide (LASIX) 40 MG tablet Take 1 tablet (40 mg total) by mouth daily. 30 tablet 2  . levothyroxine (SYNTHROID, LEVOTHROID) 100 MCG tablet Take 100 mcg by mouth daily.    . Multiple Vitamin (MULTIVITAMIN WITH MINERALS) TABS tablet Take 1 tablet by mouth daily.    . potassium chloride (K-DUR,KLOR-CON) 10 MEQ tablet Take 1 tablet (10 mEq total) by mouth daily. 30 tablet 5   No facility-administered medications prior to visit.    ROS Review of Systems  Constitutional: Negative for fever, chills, diaphoresis, appetite change and fatigue.  HENT: Negative for congestion, ear pain, hearing loss, postnasal drip, rhinorrhea, sore throat and trouble swallowing.   Respiratory: Negative for cough, chest  tightness and shortness of breath.   Cardiovascular: Negative for chest pain and palpitations.  Gastrointestinal: Negative for abdominal pain.  Musculoskeletal: Negative for arthralgias.  Skin: Negative for rash.    Objective:  BP 120/71 mmHg  Pulse 63  Temp(Src) 98.2 F (36.8 C) (Oral)  Ht 5\' 4"  (1.626 m)  Wt 196 lb (88.905 kg)  BMI 33.63 kg/m2  BP Readings from Last 3 Encounters:  05/02/15 120/71  03/21/15 92/57  01/27/15 118/77    Wt Readings from Last 3 Encounters:  05/02/15 196 lb (88.905 kg)  03/21/15 200 lb 9.6 oz (90.992 kg)  01/27/15 214 lb 9.6 oz (97.342 kg)     Physical Exam  Constitutional: She is oriented to person, place, and time. She appears well-developed and well-nourished. No distress.  HENT:  Head: Normocephalic and atraumatic.  Eyes: Conjunctivae are normal. Pupils are equal, round, and reactive to light.  Neck: Normal range of motion. Neck supple. No thyromegaly present.  Cardiovascular: Normal rate, regular rhythm and normal heart sounds.   No murmur heard. Pulmonary/Chest: Effort normal and breath sounds normal. No respiratory distress. She has no wheezes. She has no rales. Right breast exhibits mass and tenderness. Right breast exhibits no inverted nipple, no nipple discharge and no skin change. Left breast exhibits no inverted nipple, no mass, no nipple discharge, no skin change and no tenderness. Breasts are asymmetrical.    Abdominal: Soft. Bowel sounds are normal. She exhibits no distension. There is no tenderness.  Musculoskeletal: Normal range of motion.  Lymphadenopathy:  She has no cervical adenopathy.  Neurological: She is alert and oriented to person, place, and time.  Skin: Skin is warm and dry.  Psychiatric: She has a normal mood and affect. Her behavior is normal. Judgment and thought content normal.    No results found for: HGBA1C  Lab Results  Component Value Date   WBC 7.1 03/21/2015   HGB 10.7* 03/21/2015   HCT 36.1*  03/21/2015   GLUCOSE 99 03/16/2015   CHOL 266* 01/12/2015   TRIG 223* 01/12/2015   HDL 29* 01/12/2015   LDLCALC 192* 01/12/2015   ALT 16 03/16/2015   AST 17 03/16/2015   NA 145* 03/16/2015   K 3.7 03/16/2015   CL 102 03/16/2015   CREATININE 0.46* 03/16/2015   BUN 15 03/16/2015   CO2 27 03/16/2015   TSH 4.040 03/21/2015    No results found.  Assessment & Plan:   Makayla Gibson was seen today for breast mass.  Diagnoses and all orders for this visit:  Breast mass, right Orders: -     Cancel: US BREAST COMPLETE UNI RIGHT INC AXILLA -     Ambulatory referral to General Surgery -     US BREAST LTD UNI RIGHT INC AXILLA   I am having Makayla Gibson maintain her multivitamin with minerals, atorvastatin, levothyroxine, Ferrous Fumarate, furosemide, and potassium chloride.  No orders of the defined types were placed in this encounter.   Patient was referred for immediate breast ultrasound. This was performed on an urgent basis today that report has been reviewed showing a homogeneous mass and possibility of malignancy versus a cyst. Interventional radiology has scheduled her for a aspiration/core biopsy in the next few days. Subsequently she will undergo mammogram and or breast MRI.  Follow-up: Return in about 2 weeks (around 05/16/2015), or if symptoms worsen or fail to improve.  Claretta Fraise, M.D.

## 2015-05-02 ENCOUNTER — Encounter: Payer: Self-pay | Admitting: Family Medicine

## 2015-05-02 ENCOUNTER — Ambulatory Visit (INDEPENDENT_AMBULATORY_CARE_PROVIDER_SITE_OTHER): Payer: 59 | Admitting: Family Medicine

## 2015-05-02 ENCOUNTER — Encounter (INDEPENDENT_AMBULATORY_CARE_PROVIDER_SITE_OTHER): Payer: Self-pay

## 2015-05-02 VITALS — BP 120/71 | HR 63 | Temp 98.2°F | Ht 64.0 in | Wt 196.0 lb

## 2015-05-02 DIAGNOSIS — N63 Unspecified lump in breast: Secondary | ICD-10-CM | POA: Diagnosis not present

## 2015-05-02 DIAGNOSIS — N6310 Unspecified lump in the right breast, unspecified quadrant: Secondary | ICD-10-CM

## 2015-05-06 ENCOUNTER — Other Ambulatory Visit: Payer: Self-pay | Admitting: Family Medicine

## 2015-05-06 ENCOUNTER — Telehealth: Payer: Self-pay | Admitting: Family Medicine

## 2015-05-06 MED ORDER — AMOXICILLIN-POT CLAVULANATE 875-125 MG PO TABS: 1.0000 | ORAL_TABLET | Freq: Two times a day (BID) | ORAL | Status: DC

## 2015-05-06 NOTE — Telephone Encounter (Signed)
Augmentin prescription sent in. Please let the patient know. Thanks, WS.

## 2015-05-06 NOTE — Telephone Encounter (Signed)
Pt.notified

## 2015-05-10 ENCOUNTER — Telehealth: Payer: Self-pay | Admitting: Family Medicine

## 2015-05-10 MED ORDER — FUROSEMIDE 40 MG PO TABS: ORAL_TABLET | ORAL | Status: DC

## 2015-05-10 NOTE — Telephone Encounter (Signed)
done

## 2015-05-16 ENCOUNTER — Telehealth: Payer: Self-pay | Admitting: Family Medicine

## 2015-05-16 NOTE — Telephone Encounter (Signed)
Appointment made with Dr. Livia Snellen to follow up.  Patient states the lump on her breast is still there.  She has finished her antibiotic and the lump is still painful.

## 2015-05-17 ENCOUNTER — Encounter: Payer: Self-pay | Admitting: Family Medicine

## 2015-05-17 ENCOUNTER — Ambulatory Visit (INDEPENDENT_AMBULATORY_CARE_PROVIDER_SITE_OTHER): Payer: 59 | Admitting: Family Medicine

## 2015-05-17 VITALS — BP 109/76 | HR 60 | Temp 97.9°F | Ht 64.0 in | Wt 193.0 lb

## 2015-05-17 DIAGNOSIS — D509 Iron deficiency anemia, unspecified: Secondary | ICD-10-CM | POA: Diagnosis not present

## 2015-05-17 DIAGNOSIS — N61 Inflammatory disorders of breast: Secondary | ICD-10-CM

## 2015-05-17 DIAGNOSIS — N62 Hypertrophy of breast: Secondary | ICD-10-CM | POA: Insufficient documentation

## 2015-05-17 DIAGNOSIS — E038 Other specified hypothyroidism: Secondary | ICD-10-CM

## 2015-05-17 DIAGNOSIS — N611 Abscess of the breast and nipple: Secondary | ICD-10-CM

## 2015-05-17 MED ORDER — AMOXICILLIN-POT CLAVULANATE 875-125 MG PO TABS: 1.0000 | ORAL_TABLET | Freq: Two times a day (BID) | ORAL | Status: DC

## 2015-05-17 MED ORDER — CEFTRIAXONE SODIUM 1 G IJ SOLR
1.0000 g | Freq: Once | INTRAMUSCULAR | Status: AC
Start: 2015-05-17 — End: 2015-05-17
  Administered 2015-05-17: 1 g via INTRAMUSCULAR

## 2015-05-17 NOTE — Progress Notes (Signed)
Subjective:  Patient ID: Makayla Gibson, female    DOB: 1961-07-22  Age: 54 y.o. MRN: [id number]  CC: Breast Mass   HPI Makayla Gibson presents for follow-up of abscess in the breast. She recently had an ultrasound and aspiration showing infection. Based on culture Augmentin was used. She has finished the Augmentin. She says it is much smaller less painful but there is still hard knot at the site. She is scheduled for follow-up mammogram in a couple of months. There was some controversy over whether the mammogram should've been done first. However cytology came back benign. Patient now states that that area has swollen in the past primarily with her 4 pregnancies. However it's never caused a problem like this time. In the past they told her it was a blocked milk duct.  Patient presents for follow-up on  thyroid. She has a history of hypothyroidism for many years. This had to be adjusted recently. She had a borderline test 6 weeks ago. Pt. denies any change in  voice, loss of hair, heat or cold intolerance. Energy level has been adequate to good. She denies constipation and diarrhea. No myxedema. Medication is as noted below. Verified that pt is taking it daily on an empty stomach. Well tolerated.  History Makayla Gibson has a past medical history of Arthritis and Thyroid disease (11/2014).   She has past surgical history that includes Cesarean section.   Her family history includes Alcohol abuse in her father; Cancer in her brother; Heart disease in her mother; Osteoporosis in her brother; Rheumatic fever in her mother.She reports that she quit smoking about 6 years ago. She has never used smokeless tobacco. She reports that she does not drink alcohol or use illicit drugs.  Outpatient Prescriptions Prior to Visit  Medication Sig Dispense Refill  . atorvastatin (LIPITOR) 40 MG tablet Take 1 tablet (40 mg total) by mouth daily. Take for cholesterol control 90 tablet 3  . Ferrous Fumarate  (HEMOCYTE) 324 (106 FE) MG TABS Take 1 tablet by mouth 2 (two) times daily. 60 tablet 5  . furosemide (LASIX) 40 MG tablet TAKE 1 TABLET (40 MG TOTAL) BY MOUTH DAILY. 30 tablet 3  . levothyroxine (SYNTHROID, LEVOTHROID) 100 MCG tablet Take 100 mcg by mouth daily.    . Multiple Vitamin (MULTIVITAMIN WITH MINERALS) TABS tablet Take 1 tablet by mouth daily.    . potassium chloride (K-DUR,KLOR-CON) 10 MEQ tablet Take 1 tablet (10 mEq total) by mouth daily. 30 tablet 5  . amoxicillin-clavulanate (AUGMENTIN) 875-125 MG per tablet Take 1 tablet by mouth 2 (two) times daily. Take all of this medication (Patient not taking: Reported on 05/17/2015) 20 tablet 0   No facility-administered medications prior to visit.    ROS Review of Systems  Constitutional: Negative for fever, chills, diaphoresis, appetite change, fatigue and unexpected weight change.  HENT: Negative for congestion, ear pain, hearing loss, postnasal drip, rhinorrhea, sneezing, sore throat and trouble swallowing.   Eyes: Negative for pain.  Respiratory: Negative for cough, chest tightness and shortness of breath.   Cardiovascular: Negative for chest pain and palpitations.  Gastrointestinal: Negative for nausea, vomiting, abdominal pain, diarrhea and constipation.  Genitourinary: Negative for dysuria, frequency and menstrual problem.  Musculoskeletal: Negative for joint swelling and arthralgias.  Skin: Negative for rash.  Neurological: Negative for dizziness, weakness, numbness and headaches.  Psychiatric/Behavioral: Negative for dysphoric mood and agitation.    Objective:  BP 109/76 mmHg  Pulse 60  Temp(Src) 97.9 F (36.6 C) (Oral)  Ht 5\' 4"  (1.626 m)  Wt 193 lb (87.544 kg)  BMI 33.11 kg/m2  BP Readings from Last 3 Encounters:  05/17/15 109/76  05/02/15 120/71  03/21/15 92/57    Wt Readings from Last 3 Encounters:  05/17/15 193 lb (87.544 kg)  05/02/15 196 lb (88.905 kg)  03/21/15 200 lb 9.6 oz (90.992 kg)      Physical Exam  Constitutional: She is oriented to person, place, and time. She appears well-developed and well-nourished. No distress.  HENT:  Head: Normocephalic and atraumatic.  Eyes: Conjunctivae are normal. Pupils are equal, round, and reactive to light.  Neck: Normal range of motion. Neck supple. No thyromegaly present.  Cardiovascular: Normal rate, regular rhythm and normal heart sounds.   No murmur heard. Pulmonary/Chest: Effort normal and breath sounds normal. No respiratory distress. She has no wheezes. She has no rales. Right breast exhibits mass (: Tender 5 cm mass circumscribed and cystic in appearance/texture. Markedly tender without erythema or edema or adenopathy in the axilla).  Abdominal: Soft. Bowel sounds are normal. She exhibits no distension. There is no tenderness.  Musculoskeletal: Normal range of motion.  Lymphadenopathy:    She has no cervical adenopathy.  Neurological: She is alert and oriented to person, place, and time.  Skin: Skin is warm and dry.  Psychiatric: She has a normal mood and affect. Her behavior is normal. Judgment and thought content normal.    No results found for: HGBA1C  Lab Results  Component Value Date   WBC 7.1 03/21/2015   HGB 10.7* 03/21/2015   HCT 36.1* 03/21/2015   GLUCOSE 99 03/16/2015   CHOL 266* 01/12/2015   TRIG 223* 01/12/2015   HDL 29* 01/12/2015   LDLCALC 192* 01/12/2015   ALT 16 03/16/2015   AST 17 03/16/2015   NA 145* 03/16/2015   K 3.7 03/16/2015   CL 102 03/16/2015   CREATININE 0.46* 03/16/2015   BUN 15 03/16/2015   CO2 27 03/16/2015   TSH 4.040 03/21/2015    No results found.  Assessment & Plan:   Makayla Gibson was seen today for breast mass.  Diagnoses and all orders for this visit:  Abscess of right breast -     CBC With Differential -     cefTRIAXone (ROCEPHIN) injection 1 g; Inject 1 g into the muscle once.  Iron deficiency anemia -     CBC With Differential  Other specified hypothyroidism -      CBC With Differential -     TSH -     T4, free  Other orders -     amoxicillin-clavulanate (AUGMENTIN) 875-125 MG per tablet; Take 1 tablet by mouth 2 (two) times daily. Take all of this medication   I am having Makayla Gibson maintain her multivitamin with minerals, atorvastatin, levothyroxine, Ferrous Fumarate, potassium chloride, furosemide, and amoxicillin-clavulanate. We will continue to administer cefTRIAXone.  Meds ordered this encounter  Medications  . cefTRIAXone (ROCEPHIN) injection 1 g    Sig:   . amoxicillin-clavulanate (AUGMENTIN) 875-125 MG per tablet    Sig: Take 1 tablet by mouth 2 (two) times daily. Take all of this medication    Dispense:  20 tablet    Refill:  0     Follow-up: Return in about 2 weeks (around 05/31/2015) for Breast abscess recheck.  Claretta Fraise, M.D.

## 2015-05-18 LAB — CBC WITH DIFFERENTIAL
Basophils Absolute: 0 10*3/uL (ref 0.0–0.2)
Basos: 1 %
EOS (ABSOLUTE): 0.1 10*3/uL (ref 0.0–0.4)
Eos: 1 %
Hematocrit: 34.3 % (ref 34.0–46.6)
Hemoglobin: 11.5 g/dL (ref 11.1–15.9)
Immature Grans (Abs): 0 10*3/uL (ref 0.0–0.1)
Immature Granulocytes: 0 %
Lymphocytes Absolute: 1.1 10*3/uL (ref 0.7–3.1)
Lymphs: 18 %
MCH: 28.7 pg (ref 26.6–33.0)
MCHC: 33.5 g/dL (ref 31.5–35.7)
MCV: 86 fL (ref 79–97)
Monocytes Absolute: 0.5 10*3/uL (ref 0.1–0.9)
Monocytes: 8 %
Neutrophils Absolute: 4.4 10*3/uL (ref 1.4–7.0)
Neutrophils: 72 %
RBC: 4.01 x10E6/uL (ref 3.77–5.28)
RDW: 15.1 % (ref 12.3–15.4)
WBC: 6.2 10*3/uL (ref 3.4–10.8)

## 2015-05-18 LAB — T4, FREE: Free T4: 1.34 ng/dL (ref 0.82–1.77)

## 2015-05-18 LAB — TSH: TSH: 3.49 u[IU]/mL (ref 0.450–4.500)

## 2015-06-01 ENCOUNTER — Ambulatory Visit (INDEPENDENT_AMBULATORY_CARE_PROVIDER_SITE_OTHER): Payer: 59 | Admitting: Family Medicine

## 2015-06-01 ENCOUNTER — Encounter: Payer: Self-pay | Admitting: Family Medicine

## 2015-06-01 VITALS — BP 114/68 | HR 63 | Temp 97.8°F | Ht 64.0 in | Wt 194.4 lb

## 2015-06-01 DIAGNOSIS — N61 Inflammatory disorders of breast: Secondary | ICD-10-CM

## 2015-06-01 DIAGNOSIS — N611 Abscess of the breast and nipple: Secondary | ICD-10-CM

## 2015-06-01 MED ORDER — DOXYCYCLINE HYCLATE 100 MG PO TABS: 100.0000 mg | ORAL_TABLET | Freq: Two times a day (BID) | ORAL | Status: DC

## 2015-06-01 NOTE — Progress Notes (Signed)
Subjective:  Patient ID: Anice Paganini, female    DOB: 23-May-1961  Age: 54 y.o. MRN: [id number]  CC: Abscess   HPI XIAO GRAUL presents for reaccumulation of the abscess fluid. It's now tender and painful again. Location is at the right upper outer quadrant of the right breast. It hurts when she holds her arm in full abduction.  History Avanna has a past medical history of Arthritis and Thyroid disease (11/2014).   She has past surgical history that includes Cesarean section.   Her family history includes Alcohol abuse in her father; Cancer in her brother; Heart disease in her mother; Osteoporosis in her brother; Rheumatic fever in her mother.She reports that she quit smoking about 6 years ago. She has never used smokeless tobacco. She reports that she does not drink alcohol or use illicit drugs.  Outpatient Prescriptions Prior to Visit  Medication Sig Dispense Refill  . atorvastatin (LIPITOR) 40 MG tablet Take 1 tablet (40 mg total) by mouth daily. Take for cholesterol control 90 tablet 3  . Ferrous Fumarate (HEMOCYTE) 324 (106 FE) MG TABS Take 1 tablet by mouth 2 (two) times daily. 60 tablet 5  . furosemide (LASIX) 40 MG tablet TAKE 1 TABLET (40 MG TOTAL) BY MOUTH DAILY. 30 tablet 3  . levothyroxine (SYNTHROID, LEVOTHROID) 100 MCG tablet Take 100 mcg by mouth daily.    . Multiple Vitamin (MULTIVITAMIN WITH MINERALS) TABS tablet Take 1 tablet by mouth daily.    . potassium chloride (K-DUR,KLOR-CON) 10 MEQ tablet Take 1 tablet (10 mEq total) by mouth daily. 30 tablet 5  . amoxicillin-clavulanate (AUGMENTIN) 875-125 MG per tablet Take 1 tablet by mouth 2 (two) times daily. Take all of this medication (Patient not taking: Reported on 06/01/2015) 20 tablet 0   No facility-administered medications prior to visit.    ROS Review of Systems  Constitutional: Negative for fever, chills, diaphoresis, appetite change and fatigue.  HENT: Negative for congestion, rhinorrhea and sore  throat.   Respiratory: Negative for cough, chest tightness and shortness of breath.   Cardiovascular: Negative for chest pain and palpitations.  Gastrointestinal: Negative for abdominal pain.  Musculoskeletal: Negative for arthralgias.  Skin: Negative for rash.    Objective:  BP 114/68 mmHg  Pulse 63  Temp(Src) 97.8 F (36.6 C) (Oral)  Ht 5\' 4"  (1.626 m)  Wt 194 lb 6.4 oz (88.179 kg)  BMI 33.35 kg/m2  BP Readings from Last 3 Encounters:  06/01/15 114/68  05/17/15 109/76  05/02/15 120/71    Wt Readings from Last 3 Encounters:  06/01/15 194 lb 6.4 oz (88.179 kg)  05/17/15 193 lb (87.544 kg)  05/02/15 196 lb (88.905 kg)     Physical Exam  Constitutional: She is oriented to person, place, and time. She appears well-developed and well-nourished. No distress.  HENT:  Head: Normocephalic and atraumatic.  Right Ear: External ear normal.  Left Ear: External ear normal.  Nose: Nose normal.  Mouth/Throat: Oropharynx is clear and moist.  Eyes: Conjunctivae and EOM are normal. Pupils are equal, round, and reactive to light.  Neck: Normal range of motion. Neck supple.  Cardiovascular: Normal rate, regular rhythm and normal heart sounds.   No murmur heard. Pulmonary/Chest: Effort normal and breath sounds normal. No respiratory distress. She has no wheezes. She has no rales.  Neurological: She is alert and oriented to person, place, and time. She has normal reflexes.  Skin: Skin is warm and dry.  The previously described lesion from previous visits has reaccumulated.  It measures about 4 cm. It is tender but not erythematous is circumscribed under the skin to the right breast upper outer quadrant.  Psychiatric: She has a normal mood and affect. Her behavior is normal. Judgment and thought content normal.    No results found for: HGBA1C  Lab Results  Component Value Date   WBC 6.2 05/17/2015   HGB 10.7* 03/21/2015   HCT 34.3 05/17/2015   GLUCOSE 99 03/16/2015   CHOL 266*  01/12/2015   TRIG 223* 01/12/2015   HDL 29* 01/12/2015   LDLCALC 192* 01/12/2015   ALT 16 03/16/2015   AST 17 03/16/2015   NA 145* 03/16/2015   K 3.7 03/16/2015   CL 102 03/16/2015   CREATININE 0.46* 03/16/2015   BUN 15 03/16/2015   CO2 27 03/16/2015   TSH 3.490 05/17/2015    No results found.  Assessment & Plan:   Pilar was seen today for abscess.  Diagnoses and all orders for this visit:  Abscess of right breast -     MM Digital Diagnostic Bilat; Standing  Other orders -     doxycycline (VIBRA-TABS) 100 MG tablet; Take 1 tablet (100 mg total) by mouth 2 (two) times daily.   I am having Ms. Shawley start on doxycycline. I am also having her maintain her multivitamin with minerals, atorvastatin, levothyroxine, Ferrous Fumarate, potassium chloride, furosemide, and amoxicillin-clavulanate.  Meds ordered this encounter  Medications  . doxycycline (VIBRA-TABS) 100 MG tablet    Sig: Take 1 tablet (100 mg total) by mouth 2 (two) times daily.    Dispense:  30 tablet    Refill:  0     Follow-up: No Follow-up on file.  Claretta Fraise, M.D.

## 2015-06-15 ENCOUNTER — Telehealth: Payer: Self-pay | Admitting: Family Medicine

## 2015-06-15 NOTE — Telephone Encounter (Signed)
lmtcb to ask when culture was done.

## 2015-06-16 NOTE — Telephone Encounter (Signed)
Pt r/c about breast cx, results not in system. Having the lab tech check on this with [facility].

## 2015-06-16 NOTE — Telephone Encounter (Signed)
LMTCB. Checked with our Lab techs, there was no cx ordered to send out to [facility] in the system.

## 2015-06-17 ENCOUNTER — Telehealth: Payer: Self-pay | Admitting: *Deleted

## 2015-06-17 DIAGNOSIS — N6310 Unspecified lump in the right breast, unspecified quadrant: Secondary | ICD-10-CM

## 2015-06-17 NOTE — Telephone Encounter (Signed)
Pt calling regarding referral to surgeon Referral re entered in Epic Pt notified

## 2015-06-17 NOTE — Telephone Encounter (Signed)
Patient was referred to the Breast Clinic in [facility] through [facility].  Culture was done by Dr Hilma Favors at their office. Copy was forwarded to our office but I didn't see a clear result on it other than negative for malignancy.  No follow up recommendations and patient is complaining that the area is filling back up again. Asked her to contact the Breast Clinic for more detailed results and follow up recommendation. Patient agreed to plan.

## 2015-06-21 ENCOUNTER — Telehealth: Payer: Self-pay | Admitting: Family Medicine

## 2015-06-21 NOTE — Telephone Encounter (Signed)
Spoke with pt and she agrees to try Dr. Arnoldo Morale in [place] since [facility] does not accept her insurance. We will also need ot call and have copies of her mammos sent to his office and she is aware of this also

## 2015-06-22 ENCOUNTER — Encounter: Payer: Self-pay | Admitting: Family Medicine

## 2015-07-04 ENCOUNTER — Telehealth: Payer: Self-pay | Admitting: Family Medicine

## 2015-07-04 NOTE — Telephone Encounter (Signed)
Stp's husband and pt received her results from her endo.

## 2015-07-05 ENCOUNTER — Telehealth: Payer: Self-pay | Admitting: Family Medicine

## 2015-07-12 ENCOUNTER — Encounter: Payer: Self-pay | Admitting: Family Medicine

## 2015-07-13 ENCOUNTER — Encounter: Payer: Self-pay | Admitting: *Deleted

## 2015-07-20 ENCOUNTER — Encounter: Payer: Self-pay | Admitting: Family Medicine

## 2015-07-22 DIAGNOSIS — G4733 Obstructive sleep apnea (adult) (pediatric): Secondary | ICD-10-CM | POA: Insufficient documentation

## 2015-07-22 DIAGNOSIS — E669 Obesity, unspecified: Secondary | ICD-10-CM | POA: Insufficient documentation

## 2015-07-22 DIAGNOSIS — R011 Cardiac murmur, unspecified: Secondary | ICD-10-CM | POA: Insufficient documentation

## 2015-07-28 ENCOUNTER — Encounter (HOSPITAL_COMMUNITY)
Admission: RE | Admit: 2015-07-28 | Discharge: 2015-07-28 | Disposition: A | Discharge status: Home / Self Care | Payer: 59 | Source: Ambulatory Visit | Attending: General Surgery | Admitting: General Surgery

## 2015-07-28 ENCOUNTER — Encounter (HOSPITAL_COMMUNITY): Payer: Self-pay

## 2015-07-28 DIAGNOSIS — Z79899 Other long term (current) drug therapy: Secondary | ICD-10-CM | POA: Diagnosis not present

## 2015-07-28 DIAGNOSIS — Z01812 Encounter for preprocedural laboratory examination: Secondary | ICD-10-CM | POA: Diagnosis not present

## 2015-07-28 DIAGNOSIS — Z87891 Personal history of nicotine dependence: Secondary | ICD-10-CM | POA: Diagnosis not present

## 2015-07-28 DIAGNOSIS — E039 Hypothyroidism, unspecified: Secondary | ICD-10-CM | POA: Diagnosis not present

## 2015-07-28 DIAGNOSIS — N611 Abscess of the breast and nipple: Secondary | ICD-10-CM | POA: Diagnosis present

## 2015-07-28 HISTORY — DX: Anemia, unspecified: D64.9

## 2015-07-28 HISTORY — DX: Sleep apnea, unspecified: G47.30

## 2015-07-28 HISTORY — DX: Thyrotoxicosis, unspecified without thyrotoxic crisis or storm: E05.90

## 2015-07-28 HISTORY — DX: Cardiac murmur, unspecified: R01.1

## 2015-07-28 LAB — BASIC METABOLIC PANEL
ANION GAP: 8 (ref 5–15)
BUN: 19 mg/dL (ref 6–20)
CHLORIDE: 103 mmol/L (ref 101–111)
CO2: 28 mmol/L (ref 22–32)
Calcium: 9.8 mg/dL (ref 8.9–10.3)
Creatinine, Ser: 0.46 mg/dL (ref 0.44–1.00)
Glucose, Bld: 109 mg/dL — ABNORMAL HIGH (ref 65–99)
POTASSIUM: 3.7 mmol/L (ref 3.5–5.1)
SODIUM: 139 mmol/L (ref 135–145)

## 2015-07-28 LAB — CBC WITH DIFFERENTIAL/PLATELET
BASOS ABS: 0 10*3/uL (ref 0.0–0.1)
Basophils Relative: 1 %
EOS PCT: 3 %
Eosinophils Absolute: 0.2 10*3/uL (ref 0.0–0.7)
HCT: 34.7 % — ABNORMAL LOW (ref 36.0–46.0)
HEMOGLOBIN: 11.6 g/dL — AB (ref 12.0–15.0)
LYMPHS ABS: 1.3 10*3/uL (ref 0.7–4.0)
LYMPHS PCT: 20 %
MCH: 29.7 pg (ref 26.0–34.0)
MCHC: 33.4 g/dL (ref 30.0–36.0)
MCV: 89 fL (ref 78.0–100.0)
Monocytes Absolute: 0.4 10*3/uL (ref 0.1–1.0)
Monocytes Relative: 7 %
NEUTROS PCT: 69 %
Neutro Abs: 4.3 10*3/uL (ref 1.7–7.7)
PLATELETS: 237 10*3/uL (ref 150–400)
RBC: 3.9 MIL/uL (ref 3.87–5.11)
RDW: 13.7 % (ref 11.5–15.5)
WBC: 6.2 10*3/uL (ref 4.0–10.5)

## 2015-07-28 NOTE — H&P (Signed)
  NTS SOAP Note  Vital Signs:  Vitals as of: 32/08/2481: Systolic 500: Diastolic 80: Heart Rate 61: Temp 97.59F: Height 57ft 3in: Weight 195Lbs 0 Ounces: Pain Level 2: BMI 34.54  BMI : 34.54 kg/m2  Subjective: This 54 year old female presents for of a left breast abscess.  Was drained in [place] at another facility last month.  Was on antibiotics.  Swelling has recurred.  States she has had a h/o cystic lesions.  No fever, chills.  Tenderness noted in right breast.  No family h/o breast cancer.  Review of Symptoms:  Constitutional:unremarkable   Head:unremarkable Eyes:unremarkable   Nose/Mouth/Throat:unremarkable Cardiovascular:  unremarkable Respiratory:unremarkable Gastrointestinal:  unremarkable   Genitourinary:frequency Skin:unremarkable as above Hematolgic/Lymphatic:unremarkable   Allergic/Immunologic:unremarkable   Past Medical History:  Reviewed  Past Medical History  Surgical History: thyroid surgery Medical Problems: hypothyroidism Allergies: keflex, percodan, percocet Medications: thyroid supplement, lasix, kcl, iron, choelsterol med   Social History:Reviewed  Social History  Preferred Language: English Race:  White Ethnicity: Not Hispanic / Latino Age: 7 year Marital Status:  M Alcohol: former drinker   Smoking Status: Former smoker reviewed on 07/12/2015 Started Date:  Stopped Date:  Functional Status reviewed on 07/12/2015 ------------------------------------------------ Bathing: Normal Cooking: Normal Dressing: Normal Driving: Normal Eating: Normal Managing Meds: Normal Oral Care: Normal Shopping: Normal Toileting: Normal Transferring: Normal Walking: Normal Cognitive Status reviewed on 07/12/2015 ------------------------------------------------ Attention: Normal Decision Making: Normal Language: Normal Memory: Normal Motor: Normal Perception: Normal Problem Solving: Normal Visual and Spatial: Normal   Family  History:Reviewed  Family Health History Family History is Unknown    Objective Information: General:Well appearing, well nourished in no distress. Neck:Supple without lymphadenopathy.  Heart:RRR, no murmur Lungs:  CTA bilaterally, no wheezes, rhonchi, rales.  Breathing unlabored. Large swelling in the upper, outer quadrant of the right breast.  No nipple discharge, dimpling, erythema.  Axilla negative for palpable nodes.  Left breast exam unremarkable.  Informed consent obtained.  200cc of bloody fluid aspirated from the right breast.  Mass resolved, patient tolerated procedure well.  Assessment:Hematoma, right breast  H/o abscess     Plan:  Scheduled for incision and drainage of right breast abscess on 07/29/15.  Risks and benefits of procedure were fully explained to the patient, who gives informed consent.    Follow-up:pending surgery

## 2015-07-28 NOTE — Patient Instructions (Signed)
    Makayla Gibson  07/28/2015     @PREFPERIOPPHARMACY @   Your procedure is scheduled on 07/29/2015.  Report to [facility] at [place] [facility].  Call this number if you have problems the morning of surgery:  [phone]   Remember:  Do not eat food or drink liquids after midnight.  Take these medicines the morning of surgery with A SIP OF WATER:  Doxycycline and Synthroid   Do not wear jewelry, make-up or nail polish.  Do not wear lotions, powders, or perfumes.  You may wear deodorant.  Do not shave 48 hours prior to surgery.  Men may shave face and neck.  Do not bring valuables to the hospital.  [facility] is not responsible for any belongings or valuables.  Contacts, dentures or bridgework may not be worn into surgery.  Leave your suitcase in the car.  After surgery it may be brought to your room.  For patients admitted to the hospital, discharge time will be determined by your treatment team.  Patients discharged the day of surgery will not be allowed to drive home.   Name and phone number of your driver:   family Special instructions:  n/a  Please read over the following fact sheets that you were given. Care and Recovery After Surgery    Incision and Drainage, Care After Refer to this sheet in the next few weeks. These instructions provide you with information on caring for yourself after your procedure. Your caregiver may also give you more specific instructions. Your treatment has been planned according to current medical practices, but problems sometimes occur. Call your caregiver if you have any problems or questions after your procedure. HOME CARE INSTRUCTIONS   If antibiotic medicine is given, take it as directed. Finish it even if you start to feel better.  Only take over-the-counter or prescription medicines for pain, discomfort, or fever as directed by your caregiver.  Keep all follow-up appointments as directed by your caregiver.  Change any bandages  (dressings) as directed by your caregiver. Replace old dressings with clean dressings.  Wash your hands before and after caring for your wound. You will receive specific instructions for cleansing and caring for your wound.  SEEK MEDICAL CARE IF:   You have increased pain, swelling, or redness around the wound.  You have increased drainage, smell, or bleeding from the wound.  You have muscle aches, chills, or you feel generally sick.  You have a fever. MAKE SURE YOU:   Understand these instructions.  Will watch your condition.  Will get help right away if you are not doing well or get worse.   This information is not intended to replace advice given to you by your health care provider. Make sure you discuss any questions you have with your health care provider.   Document Released: 12/10/2011 Document Revised: 10/08/2014 Document Reviewed: 12/10/2011 Elsevier Interactive Patient Education [employer].

## 2015-07-29 ENCOUNTER — Ambulatory Visit (HOSPITAL_COMMUNITY)
Admission: RE | Admit: 2015-07-29 | Discharge: 2015-07-29 | Disposition: A | Discharge status: Home / Self Care | Payer: 59 | Source: Ambulatory Visit | Attending: General Surgery | Admitting: General Surgery

## 2015-07-29 ENCOUNTER — Ambulatory Visit (HOSPITAL_COMMUNITY): Payer: 59 | Admitting: Anesthesiology

## 2015-07-29 ENCOUNTER — Encounter (HOSPITAL_COMMUNITY)
Admission: RE | Disposition: A | Discharge status: Home / Self Care | Payer: Self-pay | Source: Ambulatory Visit | Attending: General Surgery

## 2015-07-29 ENCOUNTER — Encounter (HOSPITAL_COMMUNITY): Payer: Self-pay | Admitting: *Deleted

## 2015-07-29 DIAGNOSIS — N611 Abscess of the breast and nipple: Secondary | ICD-10-CM | POA: Diagnosis not present

## 2015-07-29 DIAGNOSIS — Z87891 Personal history of nicotine dependence: Secondary | ICD-10-CM | POA: Insufficient documentation

## 2015-07-29 DIAGNOSIS — Z01812 Encounter for preprocedural laboratory examination: Secondary | ICD-10-CM | POA: Insufficient documentation

## 2015-07-29 DIAGNOSIS — Z79899 Other long term (current) drug therapy: Secondary | ICD-10-CM | POA: Insufficient documentation

## 2015-07-29 DIAGNOSIS — E039 Hypothyroidism, unspecified: Secondary | ICD-10-CM | POA: Insufficient documentation

## 2015-07-29 HISTORY — PX: INCISION AND DRAINAGE ABSCESS: SHX5864

## 2015-07-29 SURGERY — INCISION AND DRAINAGE, ABSCESS
Anesthesia: General | Site: Breast | Laterality: Right

## 2015-07-29 MED ORDER — ONDANSETRON HCL 4 MG/2ML IJ SOLN: 4.0000 mg | Freq: Once | INTRAMUSCULAR | Status: DC | PRN

## 2015-07-29 MED ORDER — LIDOCAINE HCL (PF) 1 % IJ SOLN
INTRAMUSCULAR | Status: AC
  Filled 2015-07-29: qty 5

## 2015-07-29 MED ORDER — FENTANYL CITRATE (PF) 250 MCG/5ML IJ SOLN
INTRAMUSCULAR | Status: DC | PRN
  Administered 2015-07-29: 25 ug via INTRAVENOUS

## 2015-07-29 MED ORDER — MIDAZOLAM HCL 2 MG/2ML IJ SOLN
INTRAMUSCULAR | Status: AC
  Filled 2015-07-29: qty 2

## 2015-07-29 MED ORDER — ONDANSETRON HCL 4 MG/2ML IJ SOLN
INTRAMUSCULAR | Status: AC
  Filled 2015-07-29: qty 2

## 2015-07-29 MED ORDER — FENTANYL CITRATE (PF) 100 MCG/2ML IJ SOLN
INTRAMUSCULAR | Status: AC
  Filled 2015-07-29: qty 2

## 2015-07-29 MED ORDER — FENTANYL CITRATE (PF) 100 MCG/2ML IJ SOLN: 25.0000 ug | INTRAMUSCULAR | Status: DC | PRN

## 2015-07-29 MED ORDER — LACTATED RINGERS IV SOLN
INTRAVENOUS | Status: DC
  Administered 2015-07-29: 09:00:00 via INTRAVENOUS

## 2015-07-29 MED ORDER — HYDROCODONE-ACETAMINOPHEN 5-325 MG PO TABS: 1.0000 | ORAL_TABLET | ORAL | Status: DC | PRN

## 2015-07-29 MED ORDER — VANCOMYCIN HCL IN DEXTROSE 1-5 GM/200ML-% IV SOLN: 1000.0000 mg | INTRAVENOUS | Status: AC

## 2015-07-29 MED ORDER — VANCOMYCIN HCL IN DEXTROSE 1-5 GM/200ML-% IV SOLN
INTRAVENOUS | Status: AC
  Filled 2015-07-29: qty 200

## 2015-07-29 MED ORDER — ONDANSETRON HCL 4 MG/2ML IJ SOLN
4.0000 mg | Freq: Once | INTRAMUSCULAR | Status: AC
  Administered 2015-07-29: 4 mg via INTRAVENOUS

## 2015-07-29 MED ORDER — LIDOCAINE HCL 1 % IJ SOLN
INTRAMUSCULAR | Status: DC | PRN
  Administered 2015-07-29: 40 mg via INTRADERMAL

## 2015-07-29 MED ORDER — CHLORHEXIDINE GLUCONATE 4 % EX LIQD: 1.0000 "application " | Freq: Once | CUTANEOUS | Status: DC

## 2015-07-29 MED ORDER — KETOROLAC TROMETHAMINE 30 MG/ML IJ SOLN
30.0000 mg | Freq: Once | INTRAMUSCULAR | Status: AC
  Administered 2015-07-29: 30 mg via INTRAVENOUS
  Filled 2015-07-29: qty 1

## 2015-07-29 MED ORDER — FENTANYL CITRATE (PF) 250 MCG/5ML IJ SOLN
INTRAMUSCULAR | Status: AC
  Filled 2015-07-29: qty 25

## 2015-07-29 MED ORDER — MIDAZOLAM HCL 2 MG/2ML IJ SOLN
1.0000 mg | INTRAMUSCULAR | Status: DC | PRN
  Administered 2015-07-29: 2 mg via INTRAVENOUS

## 2015-07-29 MED ORDER — PROPOFOL 10 MG/ML IV BOLUS
INTRAVENOUS | Status: DC | PRN
  Administered 2015-07-29: 180 mg via INTRAVENOUS

## 2015-07-29 MED ORDER — SODIUM CHLORIDE 0.9 % IR SOLN
Status: DC | PRN
  Administered 2015-07-29: 3000 mL

## 2015-07-29 MED ORDER — FENTANYL CITRATE (PF) 100 MCG/2ML IJ SOLN
25.0000 ug | INTRAMUSCULAR | Status: AC
  Administered 2015-07-29 (×2): 25 ug via INTRAVENOUS

## 2015-07-29 SURGICAL SUPPLY — 27 items
BAG HAMPER (MISCELLANEOUS) ×2 IMPLANT
BNDG CONFORM 2 STRL LF (GAUZE/BANDAGES/DRESSINGS) ×2 IMPLANT
CLOTH BEACON ORANGE TIMEOUT ST (SAFETY) ×2 IMPLANT
COVER LIGHT HANDLE STERIS (MISCELLANEOUS) ×4 IMPLANT
ELECT REM PT RETURN 9FT ADLT (ELECTROSURGICAL) ×2
ELECTRODE REM PT RTRN 9FT ADLT (ELECTROSURGICAL) ×1 IMPLANT
GAUZE SPONGE 4X4 12PLY STRL (GAUZE/BANDAGES/DRESSINGS) ×1 IMPLANT
GLOVE BIOGEL M 7.0 STRL (GLOVE) ×1 IMPLANT
GLOVE BIOGEL M STRL SZ7.5 (GLOVE) ×2 IMPLANT
GLOVE BIOGEL PI IND STRL 7.0 (GLOVE) IMPLANT
GLOVE BIOGEL PI INDICATOR 7.0 (GLOVE) ×1
GLOVE EXAM NITRILE MD LF STRL (GLOVE) ×1 IMPLANT
GLOVE SURG SS PI 7.5 STRL IVOR (GLOVE) ×2 IMPLANT
GOWN STRL REUS W/TWL LRG LVL3 (GOWN DISPOSABLE) ×4 IMPLANT
HANDPIECE INTERPULSE COAX TIP (DISPOSABLE) ×2
KIT ROOM TURNOVER APOR (KITS) ×2 IMPLANT
MANIFOLD NEPTUNE II (INSTRUMENTS) ×2 IMPLANT
MARKER SKIN DUAL TIP RULER LAB (MISCELLANEOUS) ×2 IMPLANT
NS IRRIG 1000ML POUR BTL (IV SOLUTION) ×2 IMPLANT
PACK BASIC LIMB (CUSTOM PROCEDURE TRAY) IMPLANT
PACK MINOR (CUSTOM PROCEDURE TRAY) ×2 IMPLANT
PAD ABD 5X9 TENDERSORB (GAUZE/BANDAGES/DRESSINGS) ×1 IMPLANT
PAD ARMBOARD 7.5X6 YLW CONV (MISCELLANEOUS) ×2 IMPLANT
SET BASIN LINEN APH (SET/KITS/TRAYS/PACK) ×2 IMPLANT
SET HNDPC FAN SPRY TIP SCT (DISPOSABLE) IMPLANT
SWAB CULTURE LIQ STUART DBL (MISCELLANEOUS) ×1 IMPLANT
SYR BULB IRRIGATION 50ML (SYRINGE) ×2 IMPLANT

## 2015-07-29 NOTE — Discharge Instructions (Signed)
Remove right breast packing in shower on Sunday.  Cover wound with gauze.  Expect some drainage.

## 2015-07-29 NOTE — Anesthesia Postprocedure Evaluation (Signed)
  Anesthesia Post-op Note  Patient: Makayla Gibson  Procedure(s) Performed: Procedure(s): INCISION AND DRAINAGE BREAST ABSCESS (Right)  Patient Location: PACU  Anesthesia Type:General  Level of Consciousness: awake, alert  and oriented  Airway and Oxygen Therapy: Patient Spontanous Breathing and Patient connected to nasal cannula oxygen  Post-op Pain: mild  Post-op Assessment: Post-op Vital signs reviewed, Patient's Cardiovascular Status Stable, Respiratory Function Stable, Patent Airway, No signs of Nausea or vomiting and Adequate PO intake              Post-op Vital Signs: Reviewed and stable  Last Vitals:  Filed Vitals:   07/29/15 1050  BP: 128/65  Pulse: 66  Temp:   Resp: 12    Complications: No apparent anesthesia complications

## 2015-07-29 NOTE — Anesthesia Preprocedure Evaluation (Signed)
Anesthesia Evaluation  Patient identified by MRN, date of birth, ID band Patient awake    Reviewed: Allergy & Precautions, NPO status , Patient's Chart, lab work & pertinent test results  Airway Mallampati: I  TM Distance: >3 FB     Dental  (+) Teeth Intact, Dental Advisory Given   Pulmonary sleep apnea , former smoker,    breath sounds clear to auscultation       Cardiovascular  Rhythm:Regular Rate:Normal     Neuro/Psych    GI/Hepatic   Endo/Other  Hypothyroidism   Renal/GU      Musculoskeletal  (+) Arthritis ,   Abdominal   Peds  Hematology  (+) anemia ,   Anesthesia Other Findings   Reproductive/Obstetrics                             Anesthesia Physical Anesthesia Plan  ASA: II  Anesthesia Plan: General   Post-op Pain Management:    Induction: Intravenous  Airway Management Planned: LMA  Additional Equipment:   Intra-op Plan:   Post-operative Plan: Extubation in OR  Informed Consent: I have reviewed the patients History and Physical, chart, labs and discussed the procedure including the risks, benefits and alternatives for the proposed anesthesia with the patient or authorized representative who has indicated his/her understanding and acceptance.     Plan Discussed with:   Anesthesia Plan Comments:         Anesthesia Quick Evaluation

## 2015-07-29 NOTE — Op Note (Signed)
Patient:  Makayla Gibson  DOB:  [birth year]  MRN:  [id number]   Preop Diagnosis:  Right breast hematoma/abscess  Postop Diagnosis:  Same  Procedure:  Incision and drainage of right breast hematoma/abscess  Surgeon:  Aviva Signs, M.D.  Anes:  Gen.  Indications:  Patient is a 54 year old white female with a known history of a complex cystic mass in the right breast a previously had been needle aspirated which showed hematoma. It has recurred. Patient now comes to the operating room for incision and drainage. The risks and benefits of the procedure were fully explained to the patient, who gave informed consent.  Procedure note:  The patient was placed the supine position. After general anesthesia was administered, the right breast was prepped and draped using usual sterile technique with Betadine. Surgical site confirmation was performed.  An incision was made over the mass which was in the upper, outer quadrant of the right breast. Dissection was taken down to the cystic mass. All necrotic tissue and blood was noted within the cyst. An aerobic culture was taken and sent to microbiology. This was evacuated using suction. The cavity was then cleaned with pulse lavage therapy. Wound was then packed with iodine impregnated gauze. A dry sterile dressing was then applied.  All tape and needle counts were correct the end of the procedure. The patient was awakened and transferred to PACU in stable condition.  Complications:  None  EBL:  Minimal  Specimen:  Aerobic culture of right breast fluid

## 2015-07-29 NOTE — Transfer of Care (Signed)
Immediate Anesthesia Transfer of Care Note  Patient: Makayla Gibson  Procedure(s) Performed: Procedure(s): INCISION AND DRAINAGE BREAST ABSCESS (Right)  Patient Location: PACU  Anesthesia Type:General  Level of Consciousness: awake, alert  and oriented  Airway & Oxygen Therapy: Patient Spontanous Breathing  Post-op Assessment: Report given to RN  Post vital signs: Reviewed and stable  Last Vitals:  Filed Vitals:   07/29/15 0935  BP: 117/61  Pulse:   Temp:   Resp: 11    Complications: No apparent anesthesia complications

## 2015-07-29 NOTE — Anesthesia Procedure Notes (Signed)
Procedure Name: LMA Insertion Date/Time: 07/29/2015 10:14 AM Performed by: Tressie Stalker E Pre-anesthesia Checklist: Patient identified, Patient being monitored, Emergency Drugs available, Timeout performed and Suction available Patient Re-evaluated:Patient Re-evaluated prior to inductionOxygen Delivery Method: Circle System Utilized Preoxygenation: Pre-oxygenation with 100% oxygen Intubation Type: IV induction Ventilation: Mask ventilation without difficulty LMA: LMA inserted LMA Size: 3.0 Number of attempts: 1 Placement Confirmation: positive ETCO2 and breath sounds checked- equal and bilateral

## 2015-07-29 NOTE — Interval H&P Note (Signed)
History and Physical Interval Note:  07/29/2015 9:50 AM  Makayla Gibson  has presented today for surgery, with the diagnosis of right breast abscess  The various methods of treatment have been discussed with the patient and family. After consideration of risks, benefits and other options for treatment, the patient has consented to  Procedure(s): INCISION AND DRAINAGE BREAST ABSCESS (Right) as a surgical intervention .  The patient's history has been reviewed, patient examined, no change in status, stable for surgery.  I have reviewed the patient's chart and labs.  Questions were answered to the patient's satisfaction.     Aviva Signs A

## 2015-08-01 ENCOUNTER — Encounter (HOSPITAL_COMMUNITY): Payer: Self-pay | Admitting: General Surgery

## 2015-08-01 LAB — CULTURE, ROUTINE-ABSCESS
Culture: NO GROWTH
Gram Stain: NONE SEEN

## 2015-08-19 ENCOUNTER — Telehealth: Payer: Self-pay | Admitting: Family Medicine

## 2015-08-19 MED ORDER — LEVOTHYROXINE SODIUM 100 MCG PO TABS: 100.0000 ug | ORAL_TABLET | Freq: Every day | ORAL | Status: DC

## 2015-08-19 NOTE — Telephone Encounter (Signed)
done

## 2015-09-12 ENCOUNTER — Other Ambulatory Visit: Payer: Self-pay | Admitting: Family Medicine

## 2015-09-13 ENCOUNTER — Other Ambulatory Visit: Payer: Self-pay | Admitting: Family Medicine

## 2015-09-13 MED ORDER — FERROUS FUMARATE 324 (106 FE) MG PO TABS: 1.0000 | ORAL_TABLET | Freq: Two times a day (BID) | ORAL | Status: DC

## 2015-09-13 MED ORDER — FUROSEMIDE 40 MG PO TABS: 40.0000 mg | ORAL_TABLET | Freq: Every day | ORAL | Status: DC

## 2015-09-13 NOTE — Telephone Encounter (Signed)
done

## 2015-09-14 ENCOUNTER — Ambulatory Visit (INDEPENDENT_AMBULATORY_CARE_PROVIDER_SITE_OTHER): Payer: 59 | Admitting: Family Medicine

## 2015-09-14 VITALS — Ht 64.0 in | Wt 199.8 lb

## 2015-09-14 DIAGNOSIS — D509 Iron deficiency anemia, unspecified: Secondary | ICD-10-CM | POA: Diagnosis not present

## 2015-09-14 DIAGNOSIS — R072 Precordial pain: Secondary | ICD-10-CM

## 2015-09-14 DIAGNOSIS — M25561 Pain in right knee: Secondary | ICD-10-CM

## 2015-09-14 DIAGNOSIS — E039 Hypothyroidism, unspecified: Secondary | ICD-10-CM | POA: Diagnosis not present

## 2015-09-14 DIAGNOSIS — E785 Hyperlipidemia, unspecified: Secondary | ICD-10-CM

## 2015-09-14 DIAGNOSIS — M25562 Pain in left knee: Secondary | ICD-10-CM

## 2015-09-14 MED ORDER — FERROUS FUMARATE 324 (106 FE) MG PO TABS: 1.0000 | ORAL_TABLET | Freq: Two times a day (BID) | ORAL | Status: DC

## 2015-09-14 MED ORDER — FUROSEMIDE 40 MG PO TABS: 40.0000 mg | ORAL_TABLET | Freq: Every day | ORAL | Status: DC

## 2015-09-14 MED ORDER — POTASSIUM CHLORIDE CRYS ER 10 MEQ PO TBCR: 10.0000 meq | EXTENDED_RELEASE_TABLET | Freq: Every day | ORAL | Status: DC

## 2015-09-14 NOTE — Progress Notes (Signed)
Subjective:  Patient ID: Makayla Gibson, female    DOB: 1961-07-01  Age: 54 y.o. MRN: [id number]  CC: Hyperlipidemia; Hypothyroidism; and Anemia   HPI Makayla Gibson presents for Patient presents for follow-up on  thyroid. Thyroid has been stable recently. Pt. denies any change in  voice, loss of hair, heat or cold intolerance. Earlier this spring she noticed some hair loss but that has gone away. Energy level has been adequate to good. She denies constipation and diarrhea. No myxedema. Medication is as noted below. Verified that pt is taking it daily on an empty stomach. Well tolerated.  Patient in for follow-up of elevated cholesterol. Doing well without complaints on current medication. Patient states she has some manageable muscle and joint aches. Also in today for liver function testing. Currently no shortness of breath or other cardiovascular related symptoms noted.  Approximately one week ago when she was finishing up her day at home she began to feel a substernal heavy pressure. It radiated into the left chest toward the shoulder. It did not go into the arm or jaw. She laid down and the pain began to fade. However it was off and on all night long. The next morning she felt some in the left upper chest near the shoulder. She states this is not like any pain she's had before. It was not like heartburn or indigestion. It was not accompanied by any dyspnea or diaphoresis or nausea. She has not had a cough or congestion recently. Eventually the pain faded away. She's gone back to her usual routine since that occurred.  She continues to take the iron for anemia. Most recent hemoglobin was 11.6. That was a couple of months ago.   History Makayla Gibson has a past medical history of Arthritis; Thyroid disease (11/2014); Sleep apnea; Hyperthyroidism; Anemia; and Heart murmur.   She has past surgical history that includes Cesarean section; Appendectomy; and Incision and drainage abscess (Right,  07/29/2015).   Her family history includes Alcohol abuse in her father; Cancer in her brother; Heart disease in her mother; Osteoporosis in her brother; Rheumatic fever in her mother.She reports that she quit smoking about 6 years ago. She has never used smokeless tobacco. She reports that she does not drink alcohol or use illicit drugs.  Outpatient Prescriptions Prior to Visit  Medication Sig Dispense Refill  . atorvastatin (LIPITOR) 40 MG tablet Take 1 tablet (40 mg total) by mouth daily. Take for cholesterol control 90 tablet 3  . levothyroxine (SYNTHROID, LEVOTHROID) 100 MCG tablet Take 1 tablet (100 mcg total) by mouth daily. 30 tablet 8  . Multiple Vitamin (MULTIVITAMIN WITH MINERALS) TABS tablet Take 1 tablet by mouth daily.    . Ferrous Fumarate (FERROCITE) 324 (106 FE) MG TABS Take 1 tablet by mouth 2 (two) times daily. 60 tablet 1  . furosemide (LASIX) 40 MG tablet Take 1 tablet (40 mg total) by mouth daily. 30 tablet 1  . potassium chloride (K-DUR,KLOR-CON) 10 MEQ tablet Take 1 tablet (10 mEq total) by mouth daily. 30 tablet 5  . amoxicillin-clavulanate (AUGMENTIN) 875-125 MG per tablet Take 1 tablet by mouth 2 (two) times daily. Take all of this medication (Patient not taking: Reported on 06/01/2015) 20 tablet 0  . doxycycline (VIBRA-TABS) 100 MG tablet Take 1 tablet (100 mg total) by mouth 2 (two) times daily. 30 tablet 0  . HYDROcodone-acetaminophen (NORCO) 5-325 MG tablet Take 1-2 tablets by mouth every 4 (four) hours as needed for moderate pain. 50 tablet 0  No facility-administered medications prior to visit.    ROS Review of Systems  Constitutional: Negative for fever, activity change and appetite change.  HENT: Negative for congestion, rhinorrhea and sore throat.   Eyes: Negative for visual disturbance.  Respiratory: Negative for cough and shortness of breath.   Cardiovascular: Negative for palpitations.  Gastrointestinal: Negative for nausea, abdominal pain and diarrhea.   Genitourinary: Negative for dysuria.  Musculoskeletal: Negative for myalgias and arthralgias.    Objective:  Ht '5\' 4"'$  (1.626 m)  Wt 199 lb 12.8 oz (90.629 kg)  BMI 34.28 kg/m2  BP Readings from Last 3 Encounters:  07/29/15 123/61  07/28/15 114/59  06/01/15 114/68    Wt Readings from Last 3 Encounters:  09/14/15 199 lb 12.8 oz (90.629 kg)  07/29/15 198 lb (89.812 kg)  07/28/15 198 lb (89.812 kg)     Physical Exam  Constitutional: She is oriented to person, place, and time. She appears well-developed and well-nourished. No distress.  HENT:  Head: Normocephalic and atraumatic.  Right Ear: External ear normal.  Left Ear: External ear normal.  Nose: Nose normal.  Mouth/Throat: Oropharynx is clear and moist.  Eyes: Conjunctivae and EOM are normal. Pupils are equal, round, and reactive to light.  Neck: Normal range of motion. Neck supple. No thyromegaly present.  Cardiovascular: Normal rate, regular rhythm and normal heart sounds.   No murmur heard. Pulmonary/Chest: Effort normal and breath sounds normal. No respiratory distress. She has no wheezes. She has no rales.  Abdominal: Soft. Bowel sounds are normal. She exhibits no distension. There is no tenderness.  Lymphadenopathy:    She has no cervical adenopathy.  Neurological: She is alert and oriented to person, place, and time. She has normal reflexes.  Skin: Skin is warm and dry.  Psychiatric: She has a normal mood and affect. Her behavior is normal. Judgment and thought content normal.     Lab Results  Component Value Date   WBC 7.2 09/14/2015   HGB 11.6* 07/28/2015   HCT 35.8 09/14/2015   PLT 237 07/28/2015   GLUCOSE 100* 09/14/2015   CHOL 149 09/14/2015   TRIG 145 09/14/2015   HDL 42 09/14/2015   LDLCALC 78 09/14/2015   ALT 14 09/14/2015   AST 12 09/14/2015   NA 142 09/14/2015   K 4.1 09/14/2015   CL 103 09/14/2015   CREATININE 0.54* 09/14/2015   BUN 20 09/14/2015   CO2 24 09/14/2015   TSH 3.020  09/14/2015    No results found.  Assessment & Plan:   Makayla Gibson was seen today for hyperlipidemia, hypothyroidism and anemia.  Diagnoses and all orders for this visit:  Iron deficiency anemia -     CBC with Differential/Platelet  Hyperlipemia -     CMP14+EGFR -     Lipid panel  Precordial pain -     EKG 12-Lead -     ECHO STRESS TEST; Future  Hypothyroidism, unspecified hypothyroidism type -     TSH + free T4  Knee pain, bilateral -     ECHO STRESS TEST; Future  Other orders -     potassium chloride (K-DUR,KLOR-CON) 10 MEQ tablet; Take 1 tablet (10 mEq total) by mouth daily. -     furosemide (LASIX) 40 MG tablet; Take 1 tablet (40 mg total) by mouth daily. -     Ferrous Fumarate (FERROCITE) 324 (106 FE) MG TABS; Take 1 tablet by mouth 2 (two) times daily.   I have discontinued Ms. Plasse's amoxicillin-clavulanate, doxycycline, and HYDROcodone-acetaminophen. I  am also having her maintain her multivitamin with minerals, atorvastatin, levothyroxine, potassium chloride, furosemide, and Ferrous Fumarate.  Meds ordered this encounter  Medications  . potassium chloride (K-DUR,KLOR-CON) 10 MEQ tablet    Sig: Take 1 tablet (10 mEq total) by mouth daily.    Dispense:  30 tablet    Refill:  5  . furosemide (LASIX) 40 MG tablet    Sig: Take 1 tablet (40 mg total) by mouth daily.    Dispense:  30 tablet    Refill:  5  . Ferrous Fumarate (FERROCITE) 324 (106 FE) MG TABS    Sig: Take 1 tablet by mouth 2 (two) times daily.    Dispense:  60 tablet    Refill:  5     Follow-up: Return in about 6 months (around 03/14/2016).  Claretta Fraise, M.D.

## 2015-09-15 LAB — CBC WITH DIFFERENTIAL/PLATELET
BASOS: 1 %
Basophils Absolute: 0 10*3/uL (ref 0.0–0.2)
EOS (ABSOLUTE): 0.1 10*3/uL (ref 0.0–0.4)
EOS: 1 %
HEMATOCRIT: 35.8 % (ref 34.0–46.6)
Hemoglobin: 12 g/dL (ref 11.1–15.9)
IMMATURE GRANULOCYTES: 0 %
Immature Grans (Abs): 0 10*3/uL (ref 0.0–0.1)
LYMPHS: 19 %
Lymphocytes Absolute: 1.4 10*3/uL (ref 0.7–3.1)
MCH: 29.1 pg (ref 26.6–33.0)
MCHC: 33.5 g/dL (ref 31.5–35.7)
MCV: 87 fL (ref 79–97)
MONOCYTES: 7 %
Monocytes Absolute: 0.5 10*3/uL (ref 0.1–0.9)
NEUTROS ABS: 5.2 10*3/uL (ref 1.4–7.0)
Neutrophils: 72 %
Platelets: 269 10*3/uL (ref 150–379)
RBC: 4.13 x10E6/uL (ref 3.77–5.28)
RDW: 13.5 % (ref 12.3–15.4)
WBC: 7.2 10*3/uL (ref 3.4–10.8)

## 2015-09-15 LAB — CMP14+EGFR
ALT: 14 IU/L (ref 0–32)
AST: 12 IU/L (ref 0–40)
Albumin/Globulin Ratio: 1.6 (ref 1.1–2.5)
Albumin: 4.1 g/dL (ref 3.5–5.5)
Alkaline Phosphatase: 88 IU/L (ref 39–117)
BUN/Creatinine Ratio: 37 — ABNORMAL HIGH (ref 9–23)
BUN: 20 mg/dL (ref 6–24)
Bilirubin Total: 0.4 mg/dL (ref 0.0–1.2)
CALCIUM: 9.9 mg/dL (ref 8.7–10.2)
CO2: 24 mmol/L (ref 18–29)
CREATININE: 0.54 mg/dL — AB (ref 0.57–1.00)
Chloride: 103 mmol/L (ref 96–106)
GFR calc Af Amer: 125 mL/min/{1.73_m2} (ref >59)
GFR, EST NON AFRICAN AMERICAN: 108 mL/min/{1.73_m2} (ref >59)
GLOBULIN, TOTAL: 2.5 g/dL (ref 1.5–4.5)
GLUCOSE: 100 mg/dL — AB (ref 65–99)
Potassium: 4.1 mmol/L (ref 3.5–5.2)
SODIUM: 142 mmol/L (ref 134–144)
Total Protein: 6.6 g/dL (ref 6.0–8.5)

## 2015-09-15 LAB — TSH+FREE T4
Free T4: 1.32 ng/dL (ref 0.82–1.77)
TSH: 3.02 u[IU]/mL (ref 0.450–4.500)

## 2015-09-15 LAB — LIPID PANEL
CHOL/HDL RATIO: 3.5 ratio (ref 0.0–4.4)
Cholesterol, Total: 149 mg/dL (ref 100–199)
HDL: 42 mg/dL (ref >39)
LDL CALC: 78 mg/dL (ref 0–99)
TRIGLYCERIDES: 145 mg/dL (ref 0–149)
VLDL CHOLESTEROL CAL: 29 mg/dL (ref 5–40)

## 2015-09-16 ENCOUNTER — Encounter: Payer: Self-pay | Admitting: Family Medicine

## 2015-09-20 ENCOUNTER — Ambulatory Visit: Payer: 59 | Admitting: Family Medicine

## 2015-12-01 ENCOUNTER — Telehealth: Payer: Self-pay | Admitting: Family Medicine

## 2015-12-01 NOTE — Telephone Encounter (Signed)
scheduled

## 2015-12-05 DIAGNOSIS — J9600 Acute respiratory failure, unspecified whether with hypoxia or hypercapnia: Secondary | ICD-10-CM | POA: Diagnosis not present

## 2015-12-23 ENCOUNTER — Ambulatory Visit (INDEPENDENT_AMBULATORY_CARE_PROVIDER_SITE_OTHER): Payer: BLUE CROSS/BLUE SHIELD | Admitting: Family Medicine

## 2015-12-23 ENCOUNTER — Encounter: Payer: Self-pay | Admitting: Family Medicine

## 2015-12-23 VITALS — BP 124/77 | HR 62 | Temp 98.7°F | Ht 64.0 in | Wt 203.6 lb

## 2015-12-23 DIAGNOSIS — Z0000 Encounter for general adult medical examination without abnormal findings: Secondary | ICD-10-CM

## 2015-12-23 DIAGNOSIS — Z23 Encounter for immunization: Secondary | ICD-10-CM | POA: Diagnosis not present

## 2015-12-23 DIAGNOSIS — D509 Iron deficiency anemia, unspecified: Secondary | ICD-10-CM

## 2015-12-23 DIAGNOSIS — Z1159 Encounter for screening for other viral diseases: Secondary | ICD-10-CM

## 2015-12-23 DIAGNOSIS — E038 Other specified hypothyroidism: Secondary | ICD-10-CM

## 2015-12-23 LAB — URINALYSIS, COMPLETE
BILIRUBIN UA: NEGATIVE
Glucose, UA: NEGATIVE
Ketones, UA: NEGATIVE
Leukocytes, UA: NEGATIVE
NITRITE UA: NEGATIVE
PH UA: 5.5 (ref 5.0–7.5)
Protein, UA: NEGATIVE
RBC UA: NEGATIVE
Specific Gravity, UA: 1.025 (ref 1.005–1.030)
UUROB: 0.2 mg/dL (ref 0.2–1.0)

## 2015-12-23 LAB — MICROSCOPIC EXAMINATION
BACTERIA UA: NONE SEEN
RBC, UA: NONE SEEN /hpf (ref 0–?)

## 2015-12-23 NOTE — Progress Notes (Signed)
Subjective:  Patient ID: Makayla Gibson, female    DOB: May 31, 1961  Age: 55 y.o. MRN: [id number]  CC: Annual Exam   HPI Makayla Gibson presents for CPE   Patient presents for follow-up on  thyroid. The patient has a history of hypothyroidism for many years. It has been stable recently. Pt. denies any change in  voice, loss of hair, heat or cold intolerance. Energy level has been adequate to good. Patient denies constipation and diarrhea. No myxedema. Medication is as noted below. Verified that pt is taking it daily on an empty stomach. Well tolerated.  Patient has never been tested for hepatitis C and falls in the age  range  for high risk.  Patient has history of anemia with iron deficiency. Although her energy level is adequate and she does not feel excessively cold nor has she been pale recently it is time to recheck her indices.   History Makayla Gibson has a past medical history of Arthritis; Thyroid disease (11/2014); Sleep apnea; Hyperthyroidism; Anemia; and Heart murmur.   She has past surgical history that includes Cesarean section; Appendectomy; and Incision and drainage abscess (Right, 07/29/2015).   Her family history includes Alcohol abuse in her father; Cancer in her brother; Heart disease in her mother; Osteoporosis in her brother; Rheumatic fever in her mother.She reports that she quit smoking about 7 years ago. She has never used smokeless tobacco. She reports that she does not drink alcohol or use illicit drugs.    ROS Review of Systems  Constitutional: Negative for fever, chills, diaphoresis, appetite change, fatigue and unexpected weight change.  HENT: Negative for congestion, ear pain, hearing loss, postnasal drip, rhinorrhea, sneezing, sore throat and trouble swallowing.   Eyes: Negative for pain.  Respiratory: Negative for cough, chest tightness and shortness of breath.   Cardiovascular: Negative for chest pain and palpitations.  Gastrointestinal: Negative  for nausea, vomiting, abdominal pain, diarrhea and constipation.  Endocrine: Negative for cold intolerance, heat intolerance, polydipsia, polyphagia and polyuria.  Genitourinary: Negative for dysuria, frequency and menstrual problem.  Musculoskeletal: Negative for joint swelling and arthralgias.  Skin: Negative for rash.  Allergic/Immunologic: Negative for environmental allergies.  Neurological: Negative for dizziness, weakness, numbness and headaches.  Psychiatric/Behavioral: Negative for dysphoric mood and agitation.    Objective:  BP 124/77 mmHg  Pulse 62  Temp(Src) 98.7 F (37.1 C) (Oral)  Ht '5\' 4"'$  (1.626 m)  Wt 203 lb 9.6 oz (92.352 kg)  BMI 34.93 kg/m2  SpO2 99%  BP Readings from Last 3 Encounters:  12/23/15 124/77  07/29/15 123/61  07/28/15 114/59    Wt Readings from Last 3 Encounters:  12/23/15 203 lb 9.6 oz (92.352 kg)  09/14/15 199 lb 12.8 oz (90.629 kg)  07/29/15 198 lb (89.812 kg)     Physical Exam  Constitutional: She is oriented to person, place, and time. She appears well-developed and well-nourished. No distress.  HENT:  Head: Normocephalic and atraumatic.  Right Ear: External ear normal.  Left Ear: External ear normal.  Nose: Nose normal.  Mouth/Throat: Oropharynx is clear and moist.  Eyes: Conjunctivae and EOM are normal. Pupils are equal, round, and reactive to light.  Neck: Normal range of motion. Neck supple. No thyromegaly present.  Cardiovascular: Normal rate, regular rhythm and normal heart sounds.   No murmur heard. Pulmonary/Chest: Effort normal and breath sounds normal. No respiratory distress. She has no wheezes. She has no rales. Right breast exhibits no inverted nipple, no mass and no tenderness. Left breast exhibits  no inverted nipple, no mass and no tenderness. Breasts are symmetrical.  Abdominal: Soft. Normal appearance and bowel sounds are normal. She exhibits no distension, no abdominal bruit and no mass. There is no splenomegaly or  hepatomegaly. There is no tenderness. There is no tenderness at McBurney's point and negative Murphy's sign.  Genitourinary: Rectum normal and uterus normal. Rectal exam shows no external hemorrhoid, no internal hemorrhoid, no mass and no tenderness. No breast swelling, discharge or bleeding. Pelvic exam was performed with patient supine. There is no lesion on the right labia. There is no lesion on the left labia. Cervix exhibits no discharge and no friability. No tenderness or bleeding in the vagina.  Musculoskeletal: Normal range of motion. She exhibits no edema or tenderness.  Lymphadenopathy:    She has no cervical adenopathy.  Neurological: She is alert and oriented to person, place, and time. She has normal reflexes.  Skin: Skin is warm and dry. No rash noted.  Psychiatric: She has a normal mood and affect. Her behavior is normal. Judgment and thought content normal.     Lab Results  Component Value Date   WBC 6.0 12/23/2015   HGB 11.6* 07/28/2015   HCT 38.6 12/23/2015   PLT 246 12/23/2015   GLUCOSE WILL FOLLOW 12/23/2015   CHOL WILL FOLLOW 12/23/2015   TRIG WILL FOLLOW 12/23/2015   HDL WILL FOLLOW 12/23/2015   LDLCALC WILL FOLLOW 12/23/2015   ALT WILL FOLLOW 12/23/2015   AST WILL FOLLOW 12/23/2015   NA WILL FOLLOW 12/23/2015   K WILL FOLLOW 12/23/2015   CL WILL FOLLOW 12/23/2015   CREATININE WILL FOLLOW 12/23/2015   BUN WILL FOLLOW 12/23/2015   CO2 WILL FOLLOW 12/23/2015   TSH 1.620 12/23/2015    No results found.  Assessment & Plan:   Makayla was seen today for annual exam.  Diagnoses and all orders for this visit:  Wellness examination -     CBC with Differential/Platelet -     CMP14+EGFR -     Urinalysis, Complete -     Lipid panel -     VITAMIN D 25 Hydroxy (Vit-D Deficiency, Fractures) -     TSH -     Pap IG w/ reflex to HPV when ASC-U -     Fecal occult blood, imunochemical  Other specified hypothyroidism -     TSH  Iron deficiency anemia -     CBC  with Differential/Platelet  Need for hepatitis C screening test -     Hepatitis C antibody  Other orders -     Microscopic Examination -     Tdap vaccine greater than or equal to 7yo IM -     Hepatitis C antibody    I am having [name] maintain her multivitamin with minerals, atorvastatin, levothyroxine, potassium chloride, furosemide, and Ferrous Fumarate.  No orders of the defined types were placed in this encounter.     Follow-up: Return in about 6 months (around 06/24/2016).  Claretta Fraise, M.D.

## 2015-12-25 LAB — FECAL OCCULT BLOOD, IMMUNOCHEMICAL: Fecal Occult Bld: NEGATIVE

## 2015-12-26 LAB — HEPATITIS C ANTIBODY

## 2015-12-27 LAB — PAP IG W/ RFLX HPV ASCU: PAP Smear Comment: 0

## 2015-12-27 NOTE — Progress Notes (Signed)
Quick Note:  Your recent Pap smear performed in the office came back normal. ______

## 2015-12-28 ENCOUNTER — Telehealth: Payer: Self-pay | Admitting: Family Medicine

## 2015-12-28 LAB — CBC WITH DIFFERENTIAL/PLATELET
BASOS: 0 %
Basophils Absolute: 0 10*3/uL (ref 0.0–0.2)
EOS (ABSOLUTE): 0.1 10*3/uL (ref 0.0–0.4)
EOS: 1 %
HEMATOCRIT: 38.6 % (ref 34.0–46.6)
Hemoglobin: 13.4 g/dL (ref 11.1–15.9)
IMMATURE GRANS (ABS): 0 10*3/uL (ref 0.0–0.1)
IMMATURE GRANULOCYTES: 0 %
Lymphocytes Absolute: 1.5 10*3/uL (ref 0.7–3.1)
Lymphs: 25 %
MCH: 30.5 pg (ref 26.6–33.0)
MCHC: 34.7 g/dL (ref 31.5–35.7)
MCV: 88 fL (ref 79–97)
MONOS ABS: 0.4 10*3/uL (ref 0.1–0.9)
Monocytes: 7 %
NEUTROS PCT: 67 %
Neutrophils Absolute: 3.9 10*3/uL (ref 1.4–7.0)
Platelets: 246 10*3/uL (ref 150–379)
RBC: 4.4 x10E6/uL (ref 3.77–5.28)
RDW: 13.6 % (ref 12.3–15.4)
WBC: 6 10*3/uL (ref 3.4–10.8)

## 2015-12-28 LAB — CMP14+EGFR
ALT: 18 IU/L (ref 0–32)
AST: 11 IU/L (ref 0–40)
Albumin/Globulin Ratio: 1.9 (ref 1.2–2.2)
Albumin: 4.3 g/dL (ref 3.5–5.5)
Alkaline Phosphatase: 84 IU/L (ref 39–117)
BUN/Creatinine Ratio: 36 — ABNORMAL HIGH (ref 9–23)
BUN: 19 mg/dL (ref 6–24)
Bilirubin Total: <0.2 mg/dL (ref 0.0–1.2)
CALCIUM: 9.9 mg/dL (ref 8.7–10.2)
CO2: 23 mmol/L (ref 18–29)
CREATININE: 0.53 mg/dL — AB (ref 0.57–1.00)
Chloride: 103 mmol/L (ref 96–106)
GFR, EST AFRICAN AMERICAN: 124 mL/min/{1.73_m2} (ref >59)
GFR, EST NON AFRICAN AMERICAN: 108 mL/min/{1.73_m2} (ref >59)
GLUCOSE: 111 mg/dL — AB (ref 65–99)
Globulin, Total: 2.3 g/dL (ref 1.5–4.5)
Potassium: 4.3 mmol/L (ref 3.5–5.2)
Sodium: 145 mmol/L — ABNORMAL HIGH (ref 134–144)
TOTAL PROTEIN: 6.6 g/dL (ref 6.0–8.5)

## 2015-12-28 LAB — TSH: TSH: 1.62 u[IU]/mL (ref 0.450–4.500)

## 2015-12-28 LAB — HCV AB W REFLEX TO QUANT PCR: HCV Ab: 0.1 s/co ratio (ref 0.0–0.9)

## 2015-12-28 LAB — VITAMIN D 25 HYDROXY (VIT D DEFICIENCY, FRACTURES): Vit D, 25-Hydroxy: 27.5 ng/mL — ABNORMAL LOW (ref 30.0–100.0)

## 2015-12-28 LAB — LIPID PANEL
CHOLESTEROL TOTAL: 149 mg/dL (ref 100–199)
Chol/HDL Ratio: 3.5 ratio units (ref 0.0–4.4)
HDL: 42 mg/dL (ref >39)
LDL CALC: 73 mg/dL (ref 0–99)
TRIGLYCERIDES: 170 mg/dL — AB (ref 0–149)
VLDL CHOLESTEROL CAL: 34 mg/dL (ref 5–40)

## 2015-12-28 MED ORDER — ATORVASTATIN CALCIUM 40 MG PO TABS: 40.0000 mg | ORAL_TABLET | Freq: Every day | ORAL | Status: DC

## 2015-12-28 NOTE — Telephone Encounter (Signed)
Question answered. 

## 2016-01-05 DIAGNOSIS — J9600 Acute respiratory failure, unspecified whether with hypoxia or hypercapnia: Secondary | ICD-10-CM | POA: Diagnosis not present

## 2016-02-04 DIAGNOSIS — J9600 Acute respiratory failure, unspecified whether with hypoxia or hypercapnia: Secondary | ICD-10-CM | POA: Diagnosis not present

## 2016-03-06 DIAGNOSIS — J9600 Acute respiratory failure, unspecified whether with hypoxia or hypercapnia: Secondary | ICD-10-CM | POA: Diagnosis not present

## 2016-03-12 ENCOUNTER — Other Ambulatory Visit: Payer: Self-pay | Admitting: Family Medicine

## 2016-03-14 ENCOUNTER — Encounter: Payer: Self-pay | Admitting: Family Medicine

## 2016-03-14 ENCOUNTER — Ambulatory Visit (INDEPENDENT_AMBULATORY_CARE_PROVIDER_SITE_OTHER): Payer: BLUE CROSS/BLUE SHIELD | Admitting: Family Medicine

## 2016-03-14 VITALS — BP 116/69 | HR 64 | Temp 98.2°F | Ht 64.0 in | Wt 206.4 lb

## 2016-03-14 DIAGNOSIS — Z780 Asymptomatic menopausal state: Secondary | ICD-10-CM | POA: Diagnosis not present

## 2016-03-14 DIAGNOSIS — N63 Unspecified lump in breast: Secondary | ICD-10-CM | POA: Diagnosis not present

## 2016-03-14 DIAGNOSIS — D509 Iron deficiency anemia, unspecified: Secondary | ICD-10-CM | POA: Diagnosis not present

## 2016-03-14 DIAGNOSIS — E785 Hyperlipidemia, unspecified: Secondary | ICD-10-CM

## 2016-03-14 DIAGNOSIS — E038 Other specified hypothyroidism: Secondary | ICD-10-CM | POA: Diagnosis not present

## 2016-03-14 DIAGNOSIS — N6310 Unspecified lump in the right breast, unspecified quadrant: Secondary | ICD-10-CM

## 2016-03-14 NOTE — Progress Notes (Signed)
Subjective:  Patient ID: Makayla Gibson, female    DOB: May 28, 1961  Age: 55 y.o. MRN: [id number]  CC: Hyperlipidemia; Hypothyroidism; and Anemia   HPI Makayla Gibson presents for follow-up of elevated cholesterol. Doing well without complaints on current medication. Denies side effects of statin including myalgia and arthralgia and nausea. Also in today for liver function testing. Currently no chest pain, shortness of breath or other cardiovascular related symptoms noted.  Patient presents for follow-up on  thyroid. The patient has a history of hypothyroidism for many years. It has been stable recently. Pt. denies any change in  voice, loss of hair, heat or cold intolerance. Energy level has been adequate to good. Patient denies constipation and diarrhea. No myxedema. Medication is as noted below. Verified that pt is taking it daily on an empty stomach. Well tolerated.  No problems with anemia currently. That is she has not had any decreased energy. No problems with melena. History Makayla Gibson has a past medical history of Arthritis; Thyroid disease (11/2014); Sleep apnea; Hyperthyroidism; Anemia; and Heart murmur.   She has past surgical history that includes Cesarean section; Appendectomy; and Incision and drainage abscess (Right, 07/29/2015).   Her family history includes Alcohol abuse in her father; Cancer in her brother; Heart disease in her mother; Osteoporosis in her brother; Rheumatic fever in her mother.She reports that she quit smoking about 7 years ago. She has never used smokeless tobacco. She reports that she does not drink alcohol or use illicit drugs.  Current Outpatient Prescriptions on File Prior to Visit  Medication Sig Dispense Refill  . atorvastatin (LIPITOR) 40 MG tablet Take 1 tablet (40 mg total) by mouth daily. Take for cholesterol control 90 tablet 1  . FERROCITE 324 MG TABS tablet TAKE 1 TABLET BY MOUTH TWICE A DAY 360 tablet 0  . furosemide (LASIX) 40 MG  tablet Take 1 tablet (40 mg total) by mouth daily. 30 tablet 5  . levothyroxine (SYNTHROID, LEVOTHROID) 100 MCG tablet Take 1 tablet (100 mcg total) by mouth daily. 30 tablet 8  . Multiple Vitamin (MULTIVITAMIN WITH MINERALS) TABS tablet Take 1 tablet by mouth daily.    . potassium chloride (K-DUR,KLOR-CON) 10 MEQ tablet Take 1 tablet (10 mEq total) by mouth daily. 30 tablet 5   No current facility-administered medications on file prior to visit.    ROS Review of Systems  Constitutional: Negative for fever, activity change and appetite change.  HENT: Negative for congestion, rhinorrhea and sore throat.   Eyes: Negative for visual disturbance.  Respiratory: Negative for cough and shortness of breath.   Cardiovascular: Negative for chest pain and palpitations.  Gastrointestinal: Negative for nausea, abdominal pain and diarrhea.  Genitourinary: Negative for dysuria.  Musculoskeletal: Negative for myalgias and arthralgias.    Objective:  BP 116/69 mmHg  Pulse 64  Temp(Src) 98.2 F (36.8 C) (Oral)  Ht 5' 4" (1.626 m)  Wt 206 lb 6.4 oz (93.622 kg)  BMI 35.41 kg/m2  SpO2 97%  BP Readings from Last 3 Encounters:  03/14/16 116/69  12/23/15 124/77  07/29/15 123/61    Wt Readings from Last 3 Encounters:  03/14/16 206 lb 6.4 oz (93.622 kg)  12/23/15 203 lb 9.6 oz (92.352 kg)  09/14/15 199 lb 12.8 oz (90.629 kg)     Physical Exam  Constitutional: She is oriented to person, place, and time. She appears well-developed and well-nourished. No distress.  HENT:  Head: Normocephalic and atraumatic.  Right Ear: External ear normal.  Left Ear:  External ear normal.  Nose: Nose normal.  Mouth/Throat: Oropharynx is clear and moist.  Eyes: Conjunctivae and EOM are normal. Pupils are equal, round, and reactive to light.  Neck: Normal range of motion. Neck supple. No thyromegaly present.  Cardiovascular: Normal rate, regular rhythm and normal heart sounds.   No murmur  heard. Pulmonary/Chest: Effort normal and breath sounds normal. No respiratory distress. She has no wheezes. She has no rales.  Abdominal: Soft. Bowel sounds are normal. She exhibits no distension. There is no tenderness.  Lymphadenopathy:    She has no cervical adenopathy.  Neurological: She is alert and oriented to person, place, and time. She has normal reflexes.  Skin: Skin is warm and dry.  Psychiatric: She has a normal mood and affect. Her behavior is normal. Judgment and thought content normal.    No results found for: HGBA1C  Lab Results  Component Value Date   WBC 6.0 12/23/2015   HGB 11.6* 07/28/2015   HCT 38.6 12/23/2015   PLT 246 12/23/2015   GLUCOSE 111* 12/23/2015   CHOL 149 12/23/2015   TRIG 170* 12/23/2015   HDL 42 12/23/2015   LDLCALC 73 12/23/2015   ALT 18 12/23/2015   AST 11 12/23/2015   NA 145* 12/23/2015   K 4.3 12/23/2015   CL 103 12/23/2015   CREATININE 0.53* 12/23/2015   BUN 19 12/23/2015   CO2 23 12/23/2015   TSH 1.620 12/23/2015    No results found.  Assessment & Plan:   Makayla Gibson was seen today for hyperlipidemia, hypothyroidism and anemia.  Diagnoses and all orders for this visit:  Menopause -     DG Bone Density; Future  Iron deficiency anemia -     MM Digital Diagnostic Bilat; Future -     CBC with Differential/Platelet -     CMP14+EGFR -     Lipid panel -     TSH + free T4  Other specified hypothyroidism -     MM Digital Diagnostic Bilat; Future -     CBC with Differential/Platelet -     CMP14+EGFR -     Lipid panel -     TSH + free T4  Hyperlipemia -     MM Digital Diagnostic Bilat; Future -     CBC with Differential/Platelet -     CMP14+EGFR -     Lipid panel -     TSH + free T4  Breast mass, right -     MM Digital Diagnostic Bilat; Future -     CBC with Differential/Platelet -     CMP14+EGFR -     Lipid panel -     TSH + free T4   I am having Makayla Gibson maintain her multivitamin with minerals, levothyroxine,  potassium chloride, furosemide, atorvastatin, and FERROCITE.  No orders of the defined types were placed in this encounter.   Home IFOB kit given. Discussed colonoscopy, pt to consider that option  Follow-up: Return in about 6 months (around 09/13/2016).  Warren Stacks, M.D.  

## 2016-03-15 LAB — CMP14+EGFR
ALK PHOS: 83 IU/L (ref 39–117)
ALT: 19 IU/L (ref 0–32)
AST: 14 IU/L (ref 0–40)
Albumin/Globulin Ratio: 1.7 (ref 1.2–2.2)
Albumin: 4.1 g/dL (ref 3.5–5.5)
BUN/Creatinine Ratio: 38 — ABNORMAL HIGH (ref 9–23)
BUN: 20 mg/dL (ref 6–24)
Bilirubin Total: 0.5 mg/dL (ref 0.0–1.2)
CO2: 24 mmol/L (ref 18–29)
CREATININE: 0.52 mg/dL — AB (ref 0.57–1.00)
Calcium: 9.9 mg/dL (ref 8.7–10.2)
Chloride: 101 mmol/L (ref 96–106)
GFR calc Af Amer: 125 mL/min/{1.73_m2} (ref >59)
GFR calc non Af Amer: 109 mL/min/{1.73_m2} (ref >59)
GLOBULIN, TOTAL: 2.4 g/dL (ref 1.5–4.5)
GLUCOSE: 112 mg/dL — AB (ref 65–99)
Potassium: 3.7 mmol/L (ref 3.5–5.2)
SODIUM: 141 mmol/L (ref 134–144)
Total Protein: 6.5 g/dL (ref 6.0–8.5)

## 2016-03-15 LAB — TSH+FREE T4
FREE T4: 1.45 ng/dL (ref 0.82–1.77)
TSH: 2.21 u[IU]/mL (ref 0.450–4.500)

## 2016-03-15 LAB — CBC WITH DIFFERENTIAL/PLATELET
BASOS ABS: 0 10*3/uL (ref 0.0–0.2)
Basos: 0 %
EOS (ABSOLUTE): 0.1 10*3/uL (ref 0.0–0.4)
Eos: 1 %
Hematocrit: 38.7 % (ref 34.0–46.6)
Hemoglobin: 13.1 g/dL (ref 11.1–15.9)
Immature Grans (Abs): 0 10*3/uL (ref 0.0–0.1)
Immature Granulocytes: 0 %
LYMPHS ABS: 1.7 10*3/uL (ref 0.7–3.1)
Lymphs: 24 %
MCH: 30.5 pg (ref 26.6–33.0)
MCHC: 33.9 g/dL (ref 31.5–35.7)
MCV: 90 fL (ref 79–97)
MONOS ABS: 0.5 10*3/uL (ref 0.1–0.9)
Monocytes: 7 %
Neutrophils Absolute: 4.7 10*3/uL (ref 1.4–7.0)
Neutrophils: 68 %
Platelets: 249 10*3/uL (ref 150–379)
RBC: 4.3 x10E6/uL (ref 3.77–5.28)
RDW: 13.4 % (ref 12.3–15.4)
WBC: 7 10*3/uL (ref 3.4–10.8)

## 2016-03-15 LAB — LIPID PANEL
CHOLESTEROL TOTAL: 151 mg/dL (ref 100–199)
Chol/HDL Ratio: 3.6 ratio units (ref 0.0–4.4)
HDL: 42 mg/dL (ref >39)
LDL CALC: 87 mg/dL (ref 0–99)
TRIGLYCERIDES: 110 mg/dL (ref 0–149)
VLDL Cholesterol Cal: 22 mg/dL (ref 5–40)

## 2016-03-20 ENCOUNTER — Ambulatory Visit (INDEPENDENT_AMBULATORY_CARE_PROVIDER_SITE_OTHER): Payer: BLUE CROSS/BLUE SHIELD | Admitting: Family Medicine

## 2016-03-20 ENCOUNTER — Telehealth: Payer: Self-pay | Admitting: Family Medicine

## 2016-03-20 ENCOUNTER — Encounter: Payer: Self-pay | Admitting: Family Medicine

## 2016-03-20 VITALS — BP 115/71 | HR 80 | Temp 98.4°F | Ht 64.0 in | Wt 207.5 lb

## 2016-03-20 DIAGNOSIS — N61 Mastitis without abscess: Secondary | ICD-10-CM

## 2016-03-20 MED ORDER — DOXYCYCLINE HYCLATE 100 MG PO TABS: 100.0000 mg | ORAL_TABLET | Freq: Two times a day (BID) | ORAL | Status: DC

## 2016-03-20 NOTE — Telephone Encounter (Signed)
No answer at work number, called home number back to inform husband referral has been placed.

## 2016-03-20 NOTE — Progress Notes (Signed)
   HPI  Patient presents today here with redness and pain of the right breast.  Patient had a breast cyst that was drained and then removed in October of last year, this was about 8 months ago. About 3 days ago she developed redness and tenderness at a fluctuant area in the same area of the previous cyst.  She has mild malaise and fatigue. She denies any fevers, difficulty tolerating foods or fluids, or shortness of breath today.  PMH: Smoking status noted ROS: Per HPI  Objective: BP 115/71 mmHg  Pulse 80  Temp(Src) 98.4 F (36.9 C) (Oral)  Ht 5\' 4"  (1.626 m)  Wt 207 lb 8 oz (94.121 kg)  BMI 35.60 kg/m2 Gen: NAD, alert, cooperative with exam HEENT: NCAT CV: RRR, good S1/S2, no murmur Resp: CTABL, no wheezes, non-labored Ext: No edema, warm Neuro: Alert and oriented, No gross deficits Breast: Makayla Connors, LPN, present during exam  R breast with redness at 9:00 with a tender fluid filled mass subcutaneously.  No nipple retraction or nipple dc  Assessment and plan:  # R sided mastitis, possible cyst Cover with doxyccyline Close f/u with breast surgeon, Dr. Arnoldo Morale. RTC with any concerns     Meds ordered this encounter  Medications  . doxycycline (VIBRA-TABS) 100 MG tablet    Sig: Take 1 tablet (100 mg total) by mouth 2 (two) times daily. 1 po bid    Dispense:  20 tablet    Refill:  0    Laroy Apple, MD [facility] 03/20/2016, 1:19 PM

## 2016-03-20 NOTE — Patient Instructions (Signed)
Great to meet you!  I am sending doxycyline, An antibiotic  I would like you to see Dr. Arnoldo Morale this week if possible, it feels like there is a fluctuant mass under the skin and with your history I think you may need additional treatment.   Be sure to discuss mammogram with him.

## 2016-04-05 DIAGNOSIS — J9600 Acute respiratory failure, unspecified whether with hypoxia or hypercapnia: Secondary | ICD-10-CM | POA: Diagnosis not present

## 2016-05-06 DIAGNOSIS — J9600 Acute respiratory failure, unspecified whether with hypoxia or hypercapnia: Secondary | ICD-10-CM | POA: Diagnosis not present

## 2016-05-08 ENCOUNTER — Other Ambulatory Visit: Payer: Self-pay | Admitting: Family Medicine

## 2016-05-25 ENCOUNTER — Ambulatory Visit (INDEPENDENT_AMBULATORY_CARE_PROVIDER_SITE_OTHER): Payer: BLUE CROSS/BLUE SHIELD | Admitting: Family Medicine

## 2016-05-25 ENCOUNTER — Encounter: Payer: Self-pay | Admitting: Family Medicine

## 2016-05-25 VITALS — BP 126/76 | HR 86 | Temp 101.4°F | Ht 64.0 in | Wt 213.2 lb

## 2016-05-25 DIAGNOSIS — N644 Mastodynia: Secondary | ICD-10-CM

## 2016-05-25 MED ORDER — LEVOFLOXACIN 500 MG PO TABS: 500.0000 mg | ORAL_TABLET | Freq: Every day | ORAL | 0 refills | Status: DC

## 2016-05-25 MED ORDER — TRAMADOL HCL 50 MG PO TABS: 50.0000 mg | ORAL_TABLET | Freq: Four times a day (QID) | ORAL | Status: DC | PRN

## 2016-05-25 NOTE — Progress Notes (Signed)
Subjective:  Patient ID: Makayla Gibson, female    DOB: 07-Jan-1961  Age: 55 y.o. MRN: [id number]  CC: Breast Mass (Right x 2 mths, worsening)   HPI Makayla Gibson presents for pain and tenderness right breast increasing in pain and size over several days.    History Makayla Gibson has a past medical history of Anemia; Arthritis; Heart murmur; Hyperthyroidism; Sleep apnea; and Thyroid disease (11/2014).   Makayla Gibson has a past surgical history that includes Cesarean section; Appendectomy; and Incision and drainage abscess (Right, 07/29/2015).   Makayla Gibson family history includes Alcohol abuse in Makayla Gibson father; Cancer in Makayla Gibson brother; Heart disease in Makayla Gibson mother; Osteoporosis in Makayla Gibson brother; Rheumatic fever in Makayla Gibson mother.Makayla Gibson reports that Makayla Gibson quit smoking about 7 years ago. Makayla Gibson has never used smokeless tobacco. Makayla Gibson reports that Makayla Gibson does not drink alcohol or use drugs.    ROS Review of Systems  Constitutional: Negative for activity change, appetite change and fever.  Respiratory: Negative for chest tightness and shortness of breath.   Cardiovascular: Positive for chest pain.    Objective:  BP 126/76 (BP Location: Left Arm, Patient Position: Sitting, Cuff Size: Normal)   Pulse 86   Temp (!) 101.4 F (38.6 C) (Oral)   Ht 5\' 4"  (1.626 m)   Wt 213 lb 3.2 oz (96.7 kg)   SpO2 97%   BMI 36.60 kg/m   BP Readings from Last 3 Encounters:  05/25/16 126/76  03/20/16 115/71  03/14/16 116/69    Wt Readings from Last 3 Encounters:  05/25/16 213 lb 3.2 oz (96.7 kg)  03/20/16 207 lb 8 oz (94.1 kg)  03/14/16 206 lb 6.4 oz (93.6 kg)     Physical Exam  Constitutional: Makayla Gibson appears well-developed.  HENT:  Head: Normocephalic.  Cardiovascular: Normal rate, regular rhythm and normal heart sounds.   Pulmonary/Chest: Effort normal and breath sounds normal.  Tender, induration at right lower outer quadrant  Skin: Skin is warm and dry. There is erythema (right breast).     Lab Results  Component  Value Date   WBC 7.0 03/14/2016   HGB 11.6 (L) 07/28/2015   HCT 38.7 03/14/2016   PLT 249 03/14/2016   GLUCOSE 112 (H) 03/14/2016   CHOL 151 03/14/2016   TRIG 110 03/14/2016   HDL 42 03/14/2016   LDLCALC 87 03/14/2016   ALT 19 03/14/2016   AST 14 03/14/2016   NA 141 03/14/2016   K 3.7 03/14/2016   CL 101 03/14/2016   CREATININE 0.52 (L) 03/14/2016   BUN 20 03/14/2016   CO2 24 03/14/2016   TSH 2.210 03/14/2016    No results found.  Assessment & Plan:   Sanela was seen today for breast mass.  Diagnoses and all orders for this visit:  Breast pain -     MM Digital Diagnostic Unilat R; Future  Other orders -     traMADol (ULTRAM) 50 MG tablet; Take 1 tablet (50 mg total) by mouth 4 (four) times daily as needed for moderate pain. -     levofloxacin (LEVAQUIN) 500 MG tablet; Take 1 tablet (500 mg total) by mouth daily. For 10 days    I am having Makayla Gibson start on traMADol and levofloxacin. I am also having Makayla Gibson maintain Makayla Gibson multivitamin with minerals, levothyroxine, potassium chloride, furosemide, FERROCITE, doxycycline, and atorvastatin.  Meds ordered this encounter  Medications  . traMADol (ULTRAM) 50 MG tablet    Sig: Take 1 tablet (50 mg total) by mouth 4 (four) times daily as  needed for moderate pain.    Dispense:  60 tablet    Refill:  02  . levofloxacin (LEVAQUIN) 500 MG tablet    Sig: Take 1 tablet (500 mg total) by mouth daily. For 10 days    Dispense:  10 tablet    Refill:  0     Follow-up: Return in about 7 days (around 06/01/2016).  Claretta Fraise, M.D.

## 2016-05-28 ENCOUNTER — Telehealth: Payer: Self-pay | Admitting: Family Medicine

## 2016-05-31 DIAGNOSIS — N61 Mastitis without abscess: Secondary | ICD-10-CM | POA: Diagnosis not present

## 2016-06-05 ENCOUNTER — Other Ambulatory Visit: Payer: Self-pay | Admitting: Family Medicine

## 2016-06-06 DIAGNOSIS — J9600 Acute respiratory failure, unspecified whether with hypoxia or hypercapnia: Secondary | ICD-10-CM | POA: Diagnosis not present

## 2016-06-14 DIAGNOSIS — N644 Mastodynia: Secondary | ICD-10-CM | POA: Diagnosis not present

## 2016-06-15 DIAGNOSIS — Z1239 Encounter for other screening for malignant neoplasm of breast: Secondary | ICD-10-CM | POA: Diagnosis not present

## 2016-06-15 DIAGNOSIS — Z1231 Encounter for screening mammogram for malignant neoplasm of breast: Secondary | ICD-10-CM | POA: Diagnosis not present

## 2016-06-15 DIAGNOSIS — R922 Inconclusive mammogram: Secondary | ICD-10-CM | POA: Diagnosis not present

## 2016-06-26 DIAGNOSIS — N62 Hypertrophy of breast: Secondary | ICD-10-CM | POA: Diagnosis not present

## 2016-06-26 DIAGNOSIS — N63 Unspecified lump in unspecified breast: Secondary | ICD-10-CM | POA: Diagnosis present

## 2016-06-27 ENCOUNTER — Telehealth: Payer: Self-pay | Admitting: Family Medicine

## 2016-06-27 NOTE — Telephone Encounter (Signed)
LMRC to x-ray 

## 2016-07-03 ENCOUNTER — Ambulatory Visit (INDEPENDENT_AMBULATORY_CARE_PROVIDER_SITE_OTHER): Payer: BLUE CROSS/BLUE SHIELD | Admitting: Family Medicine

## 2016-07-03 ENCOUNTER — Encounter: Payer: Self-pay | Admitting: Family Medicine

## 2016-07-03 VITALS — BP 106/65 | HR 72 | Temp 98.7°F | Ht 64.0 in | Wt 213.0 lb

## 2016-07-03 DIAGNOSIS — N61 Mastitis without abscess: Secondary | ICD-10-CM | POA: Diagnosis not present

## 2016-07-03 DIAGNOSIS — N611 Abscess of the breast and nipple: Secondary | ICD-10-CM | POA: Diagnosis not present

## 2016-07-03 DIAGNOSIS — R928 Other abnormal and inconclusive findings on diagnostic imaging of breast: Secondary | ICD-10-CM | POA: Diagnosis not present

## 2016-07-03 DIAGNOSIS — N644 Mastodynia: Secondary | ICD-10-CM | POA: Diagnosis not present

## 2016-07-03 DIAGNOSIS — N6310 Unspecified lump in the right breast, unspecified quadrant: Secondary | ICD-10-CM | POA: Diagnosis not present

## 2016-07-03 MED ORDER — CEFTRIAXONE SODIUM 1 G IJ SOLR
1.0000 g | Freq: Once | INTRAMUSCULAR | Status: AC
  Administered 2016-07-03: 1 g via INTRAMUSCULAR

## 2016-07-03 MED ORDER — CIPROFLOXACIN HCL 500 MG PO TABS: 500.0000 mg | ORAL_TABLET | Freq: Two times a day (BID) | ORAL | 0 refills | Status: DC

## 2016-07-03 NOTE — Progress Notes (Signed)
Subjective:  Patient ID: Makayla Gibson, female    DOB: 1960/12/31  Age: 55 y.o. MRN: [id number]  CC: Breast Pain (pt here today with right breast pain that has gotten worse and states she was running a fever of 101.8 yesterday. Pt was supposed to have a diagnostic right 3-d mammo today at 3 but she is in too much pain.)   HPI ALISSAH DUNCANSON presents for Patient with symptoms noted above. Increasing over the last 2 days. She notes that this is similar to what she's had in the past but actually worse in that it is painful to walk because of the movement of her breast. She has an appointment coming up for a mammogram and she cannot stand the compression due to the severity of her pain. Additionally she has appointment with a breast surgeon to consider for reduction and removal of the cyst that has been the source of multiple infections over this last year.   History Shefali has a past medical history of Anemia; Arthritis; Heart murmur; Hyperthyroidism; Sleep apnea; and Thyroid disease (11/2014).   She has a past surgical history that includes Cesarean section; Appendectomy; and Incision and drainage abscess (Right, 07/29/2015).   Her family history includes Alcohol abuse in her father; Cancer in her brother; Heart disease in her mother; Osteoporosis in her brother; Rheumatic fever in her mother.She reports that she quit smoking about 7 years ago. She has never used smokeless tobacco. She reports that she does not drink alcohol or use drugs.    ROS Review of Systems  Constitutional: Positive for appetite change, chills, diaphoresis and fever.  Genitourinary:       See history of present illness regarding breast pain.  Skin: Positive for rash (reports redness at the site of the right breast pain at the upper medial quadrant).  Neurological: Negative for dizziness and weakness.  Psychiatric/Behavioral: Negative for agitation. The patient is not nervous/anxious.     Objective:  BP  106/65   Pulse 72   Temp 98.7 F (37.1 C) (Oral)   Ht 5\' 4"  (1.626 m)   Wt 213 lb (96.6 kg)   BMI 36.56 kg/m   BP Readings from Last 3 Encounters:  07/03/16 106/65  05/25/16 126/76  03/20/16 115/71    Wt Readings from Last 3 Encounters:  07/03/16 213 lb (96.6 kg)  05/25/16 213 lb 3.2 oz (96.7 kg)  03/20/16 207 lb 8 oz (94.1 kg)     Physical Exam  Constitutional: She is oriented to person, place, and time. She appears well-developed and well-nourished.  HENT:  Head: Normocephalic.  Eyes: EOM are normal.  Pulmonary/Chest: Deformity: 22 cm of induration and erythema over right upper breast.    Abdominal: There is no tenderness.  Musculoskeletal: Normal range of motion.  Neurological: She is alert and oriented to person, place, and time.  Psychiatric: She has a normal mood and affect.     Lab Results  Component Value Date   WBC 7.0 03/14/2016   HGB 11.6 (L) 07/28/2015   HCT 38.7 03/14/2016   PLT 249 03/14/2016   GLUCOSE 112 (H) 03/14/2016   CHOL 151 03/14/2016   TRIG 110 03/14/2016   HDL 42 03/14/2016   LDLCALC 87 03/14/2016   ALT 19 03/14/2016   AST 14 03/14/2016   NA 141 03/14/2016   K 3.7 03/14/2016   CL 101 03/14/2016   CREATININE 0.52 (L) 03/14/2016   BUN 20 03/14/2016   CO2 24 03/14/2016   TSH 2.210  03/14/2016    No results found.  Assessment & Plan:   Jermica was seen today for breast pain.  Diagnoses and all orders for this visit:  Abscess of right breast -     cefTRIAXone (ROCEPHIN) injection 1 g; Inject 1 g into the muscle once.  Breast mass, right -     US BREAST ASPIRATION RIGHT; Future  Abnormal mammogram -     US BREAST ASPIRATION RIGHT; Future  Breast pain -     US BREAST ASPIRATION RIGHT; Future  Other orders -     ciprofloxacin (CIPRO) 500 MG tablet; Take 1 tablet (500 mg total) by mouth 2 (two) times daily. For prostate. Take all of these.   Urgent referral to Breast Surgeon, Dr. Marlou Starks. (Has appt. Next week. Needs to be  seen right away   I have discontinued Ms. Hayton's doxycycline and levofloxacin. I am also having her start on ciprofloxacin. Additionally, I am having her maintain her multivitamin with minerals, potassium chloride, FERROCITE, atorvastatin, traMADol, levothyroxine, and furosemide. We will continue to administer cefTRIAXone.  Meds ordered this encounter  Medications  . cefTRIAXone (ROCEPHIN) injection 1 g  . ciprofloxacin (CIPRO) 500 MG tablet    Sig: Take 1 tablet (500 mg total) by mouth 2 (two) times daily. For prostate. Take all of these.    Dispense:  30 tablet    Refill:  0     Follow-up: Return in about 2 days (around 07/05/2016).  Claretta Fraise, M.D.

## 2016-07-06 DIAGNOSIS — J9600 Acute respiratory failure, unspecified whether with hypoxia or hypercapnia: Secondary | ICD-10-CM | POA: Diagnosis not present

## 2016-07-10 DIAGNOSIS — N6310 Unspecified lump in the right breast, unspecified quadrant: Secondary | ICD-10-CM | POA: Diagnosis not present

## 2016-07-13 ENCOUNTER — Ambulatory Visit: Payer: Self-pay | Admitting: General Surgery

## 2016-07-17 ENCOUNTER — Telehealth: Payer: Self-pay | Admitting: Family Medicine

## 2016-07-17 ENCOUNTER — Other Ambulatory Visit: Payer: Self-pay | Admitting: Family Medicine

## 2016-07-17 MED ORDER — CIPROFLOXACIN HCL 500 MG PO TABS: 500.0000 mg | ORAL_TABLET | Freq: Two times a day (BID) | ORAL | 0 refills | Status: DC

## 2016-07-17 NOTE — Telephone Encounter (Signed)
I sent in the requested prescription 

## 2016-07-17 NOTE — Telephone Encounter (Signed)
Patient aware.

## 2016-07-24 ENCOUNTER — Inpatient Hospital Stay (HOSPITAL_COMMUNITY): Admission: RE | Admit: 2016-07-24 | Payer: Self-pay | Source: Ambulatory Visit

## 2016-07-26 ENCOUNTER — Ambulatory Visit: Admit: 2016-07-26 | Payer: Self-pay | Admitting: General Surgery

## 2016-07-26 SURGERY — BREAST LUMPECTOMY
Anesthesia: General

## 2016-07-29 ENCOUNTER — Other Ambulatory Visit: Payer: Self-pay | Admitting: Family Medicine

## 2016-07-31 ENCOUNTER — Telehealth: Payer: Self-pay | Admitting: Family Medicine

## 2016-07-31 MED ORDER — CIPROFLOXACIN HCL 500 MG PO TABS: 500.0000 mg | ORAL_TABLET | Freq: Two times a day (BID) | ORAL | 1 refills | Status: DC

## 2016-07-31 NOTE — Telephone Encounter (Signed)
Refill sent to pharmacy.   

## 2016-07-31 NOTE — Telephone Encounter (Signed)
Please refill X 1 as requested 

## 2016-08-06 DIAGNOSIS — J9600 Acute respiratory failure, unspecified whether with hypoxia or hypercapnia: Secondary | ICD-10-CM | POA: Diagnosis not present

## 2016-08-16 ENCOUNTER — Other Ambulatory Visit: Payer: Self-pay | Admitting: Family Medicine

## 2016-08-16 NOTE — Telephone Encounter (Signed)
Needs to be seen. May need alternate antibiotic. Has been on 6 weeks of ciprofloxacin, continuing longer if not needed increases risk of resistant bacterial infections.

## 2016-08-16 NOTE — Telephone Encounter (Signed)
Lm to call back for appt

## 2016-08-16 NOTE — Telephone Encounter (Signed)
Has breast surgery scheduled for 09/06/16. She has been on Cipro for 3 rounds already and it is "holding the infection at [place]"  She states she needs 1 more refill on this. Note - she hasn't had kidney panel done since June.. See stacks 07/03/16 office note.

## 2016-08-17 ENCOUNTER — Encounter: Payer: Self-pay | Admitting: Nurse Practitioner

## 2016-08-17 ENCOUNTER — Ambulatory Visit (INDEPENDENT_AMBULATORY_CARE_PROVIDER_SITE_OTHER): Payer: BLUE CROSS/BLUE SHIELD | Admitting: Nurse Practitioner

## 2016-08-17 VITALS — BP 134/76 | HR 57 | Temp 97.8°F | Ht 64.0 in | Wt 212.0 lb

## 2016-08-17 DIAGNOSIS — Z792 Long term (current) use of antibiotics: Secondary | ICD-10-CM

## 2016-08-17 DIAGNOSIS — N63 Unspecified lump in unspecified breast: Secondary | ICD-10-CM | POA: Diagnosis not present

## 2016-08-17 DIAGNOSIS — R7309 Other abnormal glucose: Secondary | ICD-10-CM

## 2016-08-17 LAB — BMP8+EGFR
BUN/Creatinine Ratio: 30 — ABNORMAL HIGH (ref 9–23)
BUN: 18 mg/dL (ref 6–24)
CALCIUM: 9.6 mg/dL (ref 8.7–10.2)
CHLORIDE: 103 mmol/L (ref 96–106)
CO2: 26 mmol/L (ref 18–29)
Creatinine, Ser: 0.61 mg/dL (ref 0.57–1.00)
GFR calc non Af Amer: 103 mL/min/{1.73_m2} (ref >59)
GFR, EST AFRICAN AMERICAN: 119 mL/min/{1.73_m2} (ref >59)
GLUCOSE: 114 mg/dL — AB (ref 65–99)
Potassium: 4.2 mmol/L (ref 3.5–5.2)
Sodium: 144 mmol/L (ref 134–144)

## 2016-08-17 MED ORDER — SULFAMETHOXAZOLE-TRIMETHOPRIM 800-160 MG PO TABS
1.0000 | ORAL_TABLET | Freq: Two times a day (BID) | ORAL | 0 refills | Status: DC
Start: 2016-08-17 — End: 2016-09-14

## 2016-08-17 NOTE — Progress Notes (Signed)
   Subjective:    Patient ID: Makayla Gibson, female    DOB: 10-May-1961, 55 y.o.   MRN: [id number]  HPI Patient has a large mass in her right breast- Has been seeing Dr. Livia Snellen- she is scheduled for surgery December 7th and needs to stay on antibiotic until surgery- SHe has been on cipro for about 30days and is running out of meds. Has not had any lab work since June.    Review of Systems  Constitutional: Negative.   HENT: Negative.   Respiratory: Negative.   Cardiovascular: Negative.   Gastrointestinal: Negative.   Musculoskeletal: Negative.   Skin:       Right breast mass  Neurological: Negative.   Psychiatric/Behavioral: Negative.   All other systems reviewed and are negative.      Objective:   Physical Exam  Constitutional: She appears well-developed and well-nourished. No distress.  Cardiovascular: Normal rate, regular rhythm and normal heart sounds.   Pulmonary/Chest: Effort normal and breath sounds normal.  Extremely large mass in right breast- right breast 2 x bigger then left breast  Skin: Skin is warm and dry.  Psychiatric: She has a normal mood and affect. Her behavior is normal. Judgment and thought content normal.   BP 134/76   Pulse (!) 57   Temp 97.8 F (36.6 C) (Oral)   Ht _0  (1.626 m)   Wt 212 lb (96.2 kg)   BMI 36.39 kg/m       Assessment & Plan:  1. Breast mass in female Continue to watch for signs of infection - sulfamethoxazole-trimethoprim (BACTRIM DS) 800-160 MG tablet; Take 1 tablet by mouth 2 (two) times daily.  Dispense: 28 tablet; Refill: 0  2. Long term (current) use of antibiotics - BMP8+EGFR   Mary-Margaret Hassell Done, FNP

## 2016-08-20 ENCOUNTER — Other Ambulatory Visit: Payer: Self-pay | Admitting: *Deleted

## 2016-08-20 DIAGNOSIS — R7309 Other abnormal glucose: Secondary | ICD-10-CM

## 2016-08-20 LAB — BAYER DCA HB A1C WAIVED: HB A1C: 5.9 % (ref <7.0)

## 2016-08-20 NOTE — Addendum Note (Signed)
Addended by: Liliane Bade on: 08/20/2016 01:33 PM   Modules accepted: Orders

## 2016-08-21 DIAGNOSIS — N62 Hypertrophy of breast: Secondary | ICD-10-CM | POA: Diagnosis not present

## 2016-08-21 DIAGNOSIS — N611 Abscess of the breast and nipple: Secondary | ICD-10-CM | POA: Diagnosis not present

## 2016-08-21 DIAGNOSIS — N63 Unspecified lump in unspecified breast: Secondary | ICD-10-CM | POA: Diagnosis not present

## 2016-08-25 ENCOUNTER — Other Ambulatory Visit: Payer: Self-pay | Admitting: Family Medicine

## 2016-08-29 ENCOUNTER — Other Ambulatory Visit: Payer: Self-pay | Admitting: Nurse Practitioner

## 2016-08-29 NOTE — Telephone Encounter (Signed)
lmtcb

## 2016-08-29 NOTE — Telephone Encounter (Signed)
Patient will check with surgeon to be sure he wants her to have another antibiotic started befor her breast surgery.  She has two doses left to take.

## 2016-08-31 ENCOUNTER — Encounter (HOSPITAL_COMMUNITY)
Admission: RE | Admit: 2016-08-31 | Discharge: 2016-08-31 | Disposition: A | Discharge status: Home / Self Care | Payer: BLUE CROSS/BLUE SHIELD | Source: Ambulatory Visit | Attending: General Surgery | Admitting: General Surgery

## 2016-08-31 DIAGNOSIS — M1990 Unspecified osteoarthritis, unspecified site: Secondary | ICD-10-CM | POA: Diagnosis not present

## 2016-08-31 DIAGNOSIS — J9612 Chronic respiratory failure with hypercapnia: Secondary | ICD-10-CM | POA: Insufficient documentation

## 2016-08-31 DIAGNOSIS — E785 Hyperlipidemia, unspecified: Secondary | ICD-10-CM | POA: Insufficient documentation

## 2016-08-31 DIAGNOSIS — E038 Other specified hypothyroidism: Secondary | ICD-10-CM | POA: Insufficient documentation

## 2016-08-31 DIAGNOSIS — G4730 Sleep apnea, unspecified: Secondary | ICD-10-CM | POA: Insufficient documentation

## 2016-08-31 DIAGNOSIS — Z01812 Encounter for preprocedural laboratory examination: Secondary | ICD-10-CM | POA: Diagnosis not present

## 2016-08-31 DIAGNOSIS — N62 Hypertrophy of breast: Secondary | ICD-10-CM | POA: Diagnosis not present

## 2016-08-31 DIAGNOSIS — R011 Cardiac murmur, unspecified: Secondary | ICD-10-CM | POA: Diagnosis not present

## 2016-08-31 DIAGNOSIS — R001 Bradycardia, unspecified: Secondary | ICD-10-CM | POA: Diagnosis not present

## 2016-08-31 DIAGNOSIS — D509 Iron deficiency anemia, unspecified: Secondary | ICD-10-CM | POA: Diagnosis not present

## 2016-08-31 DIAGNOSIS — Z9011 Acquired absence of right breast and nipple: Secondary | ICD-10-CM | POA: Diagnosis not present

## 2016-08-31 DIAGNOSIS — N611 Abscess of the breast and nipple: Secondary | ICD-10-CM | POA: Diagnosis not present

## 2016-08-31 LAB — BASIC METABOLIC PANEL
ANION GAP: 8 (ref 5–15)
BUN: 17 mg/dL (ref 6–20)
CO2: 25 mmol/L (ref 22–32)
Calcium: 9.5 mg/dL (ref 8.9–10.3)
Chloride: 106 mmol/L (ref 101–111)
Creatinine, Ser: 0.6 mg/dL (ref 0.44–1.00)
GFR calc Af Amer: >60 mL/min (ref >60)
GFR calc non Af Amer: >60 mL/min (ref >60)
GLUCOSE: 95 mg/dL (ref 65–99)
POTASSIUM: 3.7 mmol/L (ref 3.5–5.1)
Sodium: 139 mmol/L (ref 135–145)

## 2016-08-31 LAB — CBC
HEMATOCRIT: 36.4 % (ref 36.0–46.0)
Hemoglobin: 12 g/dL (ref 12.0–15.0)
MCH: 29.8 pg (ref 26.0–34.0)
MCHC: 33 g/dL (ref 30.0–36.0)
MCV: 90.3 fL (ref 78.0–100.0)
PLATELETS: 236 10*3/uL (ref 150–400)
RBC: 4.03 MIL/uL (ref 3.87–5.11)
RDW: 13.4 % (ref 11.5–15.5)
WBC: 6.3 10*3/uL (ref 4.0–10.5)

## 2016-08-31 NOTE — Pre-Procedure Instructions (Signed)
    Makayla Gibson  08/31/2016      [id number] - [place], [place] [address] [place] [address] Phone: [phone] Fax: [phone]  [place], [place] [address] [place] [address] Phone: [phone] Fax: [phone]    Your procedure is scheduled on Thursday  September 06, 2016 @ 0730  Report to [place] at [facility].M.  Call this number if you have problems the morning of surgery:  [phone]   Remember:  Do not eat food or drink liquids after midnight.   Discontinue taking all aspirin products, ibuprofen products naproxen products, fish oils vitamin and herbal supplements 5 day prior to surgery  Take these medicines the morning of surgery with A SIP OF WATER  Synthroid synthroid   Do not wear jewelry, make-up or nail polish.  Do not wear lotions, powders, or perfumes, or deoderant.  Do not shave 48 hours prior to surgery.    Do not bring valuables to the hospital.  [facility] is not responsible for any belongings or valuables.  Contacts, dentures or bridgework may not be worn into surgery.  Leave your suitcase in the car.  After surgery it may be brought to your room.  For patients admitted to the hospital, discharge time will be determined by your treatment team.  Patients discharged the day of surgery will not be allowed to drive home.   Name and phone number of your driver:    Special instructions:   Please read over the following fact sheets that you were given. Pain Booklet, Coughing and Deep Breathing, MRSA Information and Surgical Site Infection Prevention

## 2016-09-05 DIAGNOSIS — J9600 Acute respiratory failure, unspecified whether with hypoxia or hypercapnia: Secondary | ICD-10-CM | POA: Diagnosis not present

## 2016-09-06 ENCOUNTER — Ambulatory Visit (HOSPITAL_COMMUNITY): Payer: BLUE CROSS/BLUE SHIELD | Admitting: Vascular Surgery

## 2016-09-06 ENCOUNTER — Ambulatory Visit (HOSPITAL_COMMUNITY)
Admission: RE | Admit: 2016-09-06 | Discharge: 2016-09-07 | Disposition: A | Discharge status: Home / Self Care | Payer: BLUE CROSS/BLUE SHIELD | Source: Ambulatory Visit | Attending: General Surgery | Admitting: General Surgery

## 2016-09-06 ENCOUNTER — Encounter (HOSPITAL_COMMUNITY)
Admission: RE | Disposition: A | Discharge status: Home / Self Care | Payer: Self-pay | Source: Ambulatory Visit | Attending: General Surgery

## 2016-09-06 ENCOUNTER — Encounter (HOSPITAL_COMMUNITY): Payer: Self-pay | Admitting: *Deleted

## 2016-09-06 ENCOUNTER — Ambulatory Visit (HOSPITAL_COMMUNITY): Payer: BLUE CROSS/BLUE SHIELD | Admitting: Anesthesiology

## 2016-09-06 DIAGNOSIS — G4730 Sleep apnea, unspecified: Secondary | ICD-10-CM | POA: Diagnosis not present

## 2016-09-06 DIAGNOSIS — N611 Abscess of the breast and nipple: Secondary | ICD-10-CM | POA: Insufficient documentation

## 2016-09-06 DIAGNOSIS — D509 Iron deficiency anemia, unspecified: Secondary | ICD-10-CM | POA: Diagnosis not present

## 2016-09-06 DIAGNOSIS — Z885 Allergy status to narcotic agent status: Secondary | ICD-10-CM | POA: Diagnosis not present

## 2016-09-06 DIAGNOSIS — E039 Hypothyroidism, unspecified: Secondary | ICD-10-CM | POA: Diagnosis not present

## 2016-09-06 DIAGNOSIS — K219 Gastro-esophageal reflux disease without esophagitis: Secondary | ICD-10-CM | POA: Insufficient documentation

## 2016-09-06 DIAGNOSIS — L818 Other specified disorders of pigmentation: Secondary | ICD-10-CM | POA: Insufficient documentation

## 2016-09-06 DIAGNOSIS — E7800 Pure hypercholesterolemia, unspecified: Secondary | ICD-10-CM | POA: Insufficient documentation

## 2016-09-06 DIAGNOSIS — E785 Hyperlipidemia, unspecified: Secondary | ICD-10-CM | POA: Diagnosis not present

## 2016-09-06 DIAGNOSIS — Z9981 Dependence on supplemental oxygen: Secondary | ICD-10-CM | POA: Diagnosis not present

## 2016-09-06 DIAGNOSIS — N6310 Unspecified lump in the right breast, unspecified quadrant: Secondary | ICD-10-CM | POA: Diagnosis not present

## 2016-09-06 DIAGNOSIS — M1990 Unspecified osteoarthritis, unspecified site: Secondary | ICD-10-CM | POA: Diagnosis not present

## 2016-09-06 DIAGNOSIS — Z881 Allergy status to other antibiotic agents status: Secondary | ICD-10-CM | POA: Diagnosis not present

## 2016-09-06 DIAGNOSIS — Z87891 Personal history of nicotine dependence: Secondary | ICD-10-CM | POA: Diagnosis not present

## 2016-09-06 DIAGNOSIS — E038 Other specified hypothyroidism: Secondary | ICD-10-CM | POA: Diagnosis not present

## 2016-09-06 DIAGNOSIS — N63 Unspecified lump in unspecified breast: Secondary | ICD-10-CM | POA: Diagnosis present

## 2016-09-06 HISTORY — PX: MASTECTOMY, PARTIAL: SHX709

## 2016-09-06 HISTORY — PX: BREAST CYST EXCISION: SHX579

## 2016-09-06 HISTORY — DX: Abscess of the breast and nipple: N61.1

## 2016-09-06 SURGERY — MASTECTOMY PARTIAL
Anesthesia: General | Site: Breast | Laterality: Right

## 2016-09-06 MED ORDER — PANTOPRAZOLE SODIUM 40 MG IV SOLR
40.0000 mg | Freq: Every day | INTRAVENOUS | Status: DC
  Administered 2016-09-06: 40 mg via INTRAVENOUS
  Filled 2016-09-06: qty 40

## 2016-09-06 MED ORDER — ONDANSETRON HCL 4 MG/2ML IJ SOLN
INTRAMUSCULAR | Status: DC | PRN
  Administered 2016-09-06: 4 mg via INTRAVENOUS

## 2016-09-06 MED ORDER — ACETAMINOPHEN 10 MG/ML IV SOLN
INTRAVENOUS | Status: DC | PRN
  Administered 2016-09-06: 1000 mg via INTRAVENOUS

## 2016-09-06 MED ORDER — ONDANSETRON HCL 4 MG/2ML IJ SOLN
INTRAMUSCULAR | Status: AC
  Filled 2016-09-06: qty 2

## 2016-09-06 MED ORDER — 0.9 % SODIUM CHLORIDE (POUR BTL) OPTIME
TOPICAL | Status: DC | PRN
  Administered 2016-09-06 (×2): 1000 mL

## 2016-09-06 MED ORDER — METHOCARBAMOL 500 MG PO TABS: 500.0000 mg | ORAL_TABLET | Freq: Four times a day (QID) | ORAL | Status: DC | PRN

## 2016-09-06 MED ORDER — ADULT MULTIVITAMIN W/MINERALS CH
1.0000 | ORAL_TABLET | Freq: Every day | ORAL | Status: DC
  Administered 2016-09-07: 1 via ORAL
  Filled 2016-09-06 (×2): qty 1

## 2016-09-06 MED ORDER — VANCOMYCIN HCL IN DEXTROSE 1-5 GM/200ML-% IV SOLN
1000.0000 mg | INTRAVENOUS | Status: AC
  Administered 2016-09-06: 1000 mg via INTRAVENOUS
  Filled 2016-09-06: qty 200

## 2016-09-06 MED ORDER — ONDANSETRON 4 MG PO TBDP: 4.0000 mg | ORAL_TABLET | Freq: Four times a day (QID) | ORAL | Status: DC | PRN

## 2016-09-06 MED ORDER — BUPIVACAINE HCL (PF) 0.25 % IJ SOLN
INTRAMUSCULAR | Status: DC | PRN
  Administered 2016-09-06: 30 mL

## 2016-09-06 MED ORDER — KETOROLAC TROMETHAMINE 30 MG/ML IJ SOLN
30.0000 mg | Freq: Once | INTRAMUSCULAR | Status: AC | PRN
  Administered 2016-09-06: 30 mg via INTRAVENOUS

## 2016-09-06 MED ORDER — LACTATED RINGERS IV SOLN
INTRAVENOUS | Status: DC | PRN
  Administered 2016-09-06 (×2): via INTRAVENOUS

## 2016-09-06 MED ORDER — DEXAMETHASONE SODIUM PHOSPHATE 10 MG/ML IJ SOLN
INTRAMUSCULAR | Status: AC
  Filled 2016-09-06: qty 1

## 2016-09-06 MED ORDER — SUGAMMADEX SODIUM 200 MG/2ML IV SOLN
INTRAVENOUS | Status: DC | PRN
  Administered 2016-09-06: 200 mg via INTRAVENOUS

## 2016-09-06 MED ORDER — VANCOMYCIN HCL 10 G IV SOLR
1250.0000 mg | Freq: Two times a day (BID) | INTRAVENOUS | Status: DC
  Administered 2016-09-06 – 2016-09-07 (×2): 1250 mg via INTRAVENOUS
  Filled 2016-09-06 (×4): qty 1250

## 2016-09-06 MED ORDER — ROCURONIUM BROMIDE 100 MG/10ML IV SOLN
INTRAVENOUS | Status: DC | PRN
  Administered 2016-09-06: 50 mg via INTRAVENOUS

## 2016-09-06 MED ORDER — PROPOFOL 10 MG/ML IV BOLUS
INTRAVENOUS | Status: AC
  Filled 2016-09-06: qty 20

## 2016-09-06 MED ORDER — POTASSIUM CHLORIDE CRYS ER 10 MEQ PO TBCR
10.0000 meq | EXTENDED_RELEASE_TABLET | Freq: Every day | ORAL | Status: DC
  Administered 2016-09-07: 10 meq via ORAL
  Filled 2016-09-06 (×2): qty 1

## 2016-09-06 MED ORDER — LIDOCAINE HCL (CARDIAC) 20 MG/ML IV SOLN
INTRAVENOUS | Status: DC | PRN
  Administered 2016-09-06: 60 mg via INTRAVENOUS

## 2016-09-06 MED ORDER — FUROSEMIDE 40 MG PO TABS
40.0000 mg | ORAL_TABLET | Freq: Every day | ORAL | Status: DC
  Filled 2016-09-06 (×2): qty 1

## 2016-09-06 MED ORDER — LIDOCAINE 2% (20 MG/ML) 5 ML SYRINGE
INTRAMUSCULAR | Status: AC
  Filled 2016-09-06: qty 5

## 2016-09-06 MED ORDER — HYDROCODONE-ACETAMINOPHEN 5-325 MG PO TABS
1.0000 | ORAL_TABLET | ORAL | Status: DC | PRN
  Administered 2016-09-06 – 2016-09-07 (×4): 2 via ORAL
  Filled 2016-09-06 (×5): qty 2

## 2016-09-06 MED ORDER — MIDAZOLAM HCL 2 MG/2ML IJ SOLN
INTRAMUSCULAR | Status: AC
  Filled 2016-09-06: qty 2

## 2016-09-06 MED ORDER — FENTANYL CITRATE (PF) 100 MCG/2ML IJ SOLN
INTRAMUSCULAR | Status: DC | PRN
  Administered 2016-09-06: 100 ug via INTRAVENOUS
  Administered 2016-09-06 (×2): 50 ug via INTRAVENOUS

## 2016-09-06 MED ORDER — HYDROMORPHONE HCL 1 MG/ML IJ SOLN
0.2500 mg | INTRAMUSCULAR | Status: DC | PRN
  Administered 2016-09-06: 0.5 mg via INTRAVENOUS

## 2016-09-06 MED ORDER — FENTANYL CITRATE (PF) 100 MCG/2ML IJ SOLN: 25.0000 ug | INTRAMUSCULAR | Status: DC | PRN

## 2016-09-06 MED ORDER — ROCURONIUM BROMIDE 10 MG/ML (PF) SYRINGE
PREFILLED_SYRINGE | INTRAVENOUS | Status: AC
  Filled 2016-09-06: qty 10

## 2016-09-06 MED ORDER — LEVOTHYROXINE SODIUM 100 MCG PO TABS
100.0000 ug | ORAL_TABLET | Freq: Every day | ORAL | Status: DC
  Administered 2016-09-07: 100 ug via ORAL
  Filled 2016-09-06: qty 1

## 2016-09-06 MED ORDER — EPHEDRINE SULFATE 50 MG/ML IJ SOLN
INTRAMUSCULAR | Status: DC | PRN
  Administered 2016-09-06: 5 mg via INTRAVENOUS
  Administered 2016-09-06: 10 mg via INTRAVENOUS

## 2016-09-06 MED ORDER — KCL IN DEXTROSE-NACL 20-5-0.9 MEQ/L-%-% IV SOLN
INTRAVENOUS | Status: DC
  Administered 2016-09-06: 14:00:00 via INTRAVENOUS
  Filled 2016-09-06 (×2): qty 1000

## 2016-09-06 MED ORDER — PROPOFOL 10 MG/ML IV BOLUS
INTRAVENOUS | Status: DC | PRN
  Administered 2016-09-06: 180 mg via INTRAVENOUS

## 2016-09-06 MED ORDER — ATORVASTATIN CALCIUM 40 MG PO TABS
40.0000 mg | ORAL_TABLET | Freq: Every day | ORAL | Status: DC
  Administered 2016-09-06: 40 mg via ORAL
  Filled 2016-09-06 (×3): qty 1

## 2016-09-06 MED ORDER — ONDANSETRON HCL 4 MG/2ML IJ SOLN: 4.0000 mg | Freq: Four times a day (QID) | INTRAMUSCULAR | Status: DC | PRN

## 2016-09-06 MED ORDER — FENTANYL CITRATE (PF) 100 MCG/2ML IJ SOLN
INTRAMUSCULAR | Status: AC
  Filled 2016-09-06: qty 4

## 2016-09-06 MED ORDER — HYDROMORPHONE HCL 1 MG/ML IJ SOLN
INTRAMUSCULAR | Status: AC
  Filled 2016-09-06: qty 0.5

## 2016-09-06 MED ORDER — SUGAMMADEX SODIUM 200 MG/2ML IV SOLN
INTRAVENOUS | Status: AC
  Filled 2016-09-06: qty 2

## 2016-09-06 MED ORDER — DEXAMETHASONE SODIUM PHOSPHATE 10 MG/ML IJ SOLN
INTRAMUSCULAR | Status: DC | PRN
  Administered 2016-09-06: 10 mg via INTRAVENOUS

## 2016-09-06 MED ORDER — KETOROLAC TROMETHAMINE 30 MG/ML IJ SOLN
INTRAMUSCULAR | Status: AC
  Filled 2016-09-06: qty 1

## 2016-09-06 MED ORDER — HEPARIN SODIUM (PORCINE) 5000 UNIT/ML IJ SOLN
5000.0000 [IU] | Freq: Three times a day (TID) | INTRAMUSCULAR | Status: DC
  Administered 2016-09-07: 5000 [IU] via SUBCUTANEOUS
  Filled 2016-09-06 (×2): qty 1

## 2016-09-06 MED ORDER — PROMETHAZINE HCL 25 MG/ML IJ SOLN: 6.2500 mg | INTRAMUSCULAR | Status: DC | PRN

## 2016-09-06 MED ORDER — MIDAZOLAM HCL 5 MG/5ML IJ SOLN
INTRAMUSCULAR | Status: DC | PRN
  Administered 2016-09-06: 2 mg via INTRAVENOUS

## 2016-09-06 MED ORDER — EPHEDRINE 5 MG/ML INJ
INTRAVENOUS | Status: AC
  Filled 2016-09-06: qty 10

## 2016-09-06 MED ORDER — CHLORHEXIDINE GLUCONATE CLOTH 2 % EX PADS: 6.0000 | MEDICATED_PAD | Freq: Once | CUTANEOUS | Status: DC

## 2016-09-06 MED ORDER — BUPIVACAINE HCL (PF) 0.25 % IJ SOLN
INTRAMUSCULAR | Status: AC
  Filled 2016-09-06: qty 30

## 2016-09-06 SURGICAL SUPPLY — 68 items
ADH SKN CLS APL DERMABOND .7 (GAUZE/BANDAGES/DRESSINGS) ×4
BAG DECANTER FOR FLEXI CONT (MISCELLANEOUS) ×1 IMPLANT
BINDER BREAST LRG (GAUZE/BANDAGES/DRESSINGS) IMPLANT
BINDER BREAST XLRG (GAUZE/BANDAGES/DRESSINGS) IMPLANT
BINDER BREAST XXLRG (GAUZE/BANDAGES/DRESSINGS) ×2 IMPLANT
BIOPATCH RED 1 DISK 7.0 (GAUZE/BANDAGES/DRESSINGS) ×3 IMPLANT
BLADE 10 SAFETY STRL DISP (BLADE) ×1 IMPLANT
CANISTER SUCTION 2500CC (MISCELLANEOUS) ×3 IMPLANT
CHLORAPREP W/TINT 26ML (MISCELLANEOUS) ×6 IMPLANT
COVER SURGICAL LIGHT HANDLE (MISCELLANEOUS) ×4 IMPLANT
DECANTER SPIKE VIAL GLASS SM (MISCELLANEOUS) ×3 IMPLANT
DERMABOND ADVANCED (GAUZE/BANDAGES/DRESSINGS) ×2
DERMABOND ADVANCED .7 DNX12 (GAUZE/BANDAGES/DRESSINGS) ×4 IMPLANT
DEVICE DUBIN SPECIMEN MAMMOGRA (MISCELLANEOUS) IMPLANT
DRAIN CHANNEL 19F RND (DRAIN) ×3 IMPLANT
DRAPE LAPAROSCOPIC ABDOMINAL (DRAPES) ×1 IMPLANT
DRAPE ORTHO SPLIT 77X108 STRL (DRAPES)
DRAPE PROXIMA HALF (DRAPES) ×4 IMPLANT
DRAPE SURG 17X23 STRL (DRAPES) ×4 IMPLANT
DRAPE SURG ORHT 6 SPLT 77X108 (DRAPES) ×2 IMPLANT
DRAPE UTILITY XL STRL (DRAPES) ×2 IMPLANT
DRAPE WARM FLUID 44X44 (DRAPE) ×1 IMPLANT
DRSG PAD ABDOMINAL 8X10 ST (GAUZE/BANDAGES/DRESSINGS) ×6 IMPLANT
DRSG TEGADERM 2-3/8X2-3/4 SM (GAUZE/BANDAGES/DRESSINGS) ×2 IMPLANT
ELECT BLADE 4.0 EZ CLEAN MEGAD (MISCELLANEOUS)
ELECT CAUTERY BLADE 6.4 (BLADE) ×3 IMPLANT
ELECT REM PT RETURN 9FT ADLT (ELECTROSURGICAL) ×3
ELECTRODE BLDE 4.0 EZ CLN MEGD (MISCELLANEOUS) ×1 IMPLANT
ELECTRODE REM PT RTRN 9FT ADLT (ELECTROSURGICAL) ×3 IMPLANT
EVACUATOR SILICONE 100CC (DRAIN) ×3 IMPLANT
GAUZE SPONGE 4X4 12PLY STRL (GAUZE/BANDAGES/DRESSINGS) ×3 IMPLANT
GAUZE SPONGE 4X4 16PLY XRAY LF (GAUZE/BANDAGES/DRESSINGS) ×1 IMPLANT
GLOVE BIO SURGEON STRL SZ 6.5 (GLOVE) ×7 IMPLANT
GLOVE BIO SURGEON STRL SZ7.5 (GLOVE) ×3 IMPLANT
GLOVE BIOGEL PI IND STRL 7.0 (GLOVE) ×2 IMPLANT
GLOVE BIOGEL PI IND STRL 7.5 (GLOVE) ×1 IMPLANT
GLOVE BIOGEL PI INDICATOR 7.0 (GLOVE) ×2
GLOVE BIOGEL PI INDICATOR 7.5 (GLOVE) ×1
GLOVE SURG SS PI 7.5 STRL IVOR (GLOVE) ×2 IMPLANT
GOWN STRL REUS W/ TWL LRG LVL3 (GOWN DISPOSABLE) ×10 IMPLANT
GOWN STRL REUS W/TWL LRG LVL3 (GOWN DISPOSABLE) ×18
KIT BASIN OR (CUSTOM PROCEDURE TRAY) ×4 IMPLANT
KIT ROOM TURNOVER OR (KITS) ×4 IMPLANT
LIGHT WAVEGUIDE WIDE FLAT (MISCELLANEOUS) IMPLANT
NDL HYPO 25GX1X1/2 BEV (NEEDLE) ×1 IMPLANT
NEEDLE HYPO 25GX1X1/2 BEV (NEEDLE) ×3 IMPLANT
NS IRRIG 1000ML POUR BTL (IV SOLUTION) ×7 IMPLANT
PACK GENERAL/GYN (CUSTOM PROCEDURE TRAY) ×3 IMPLANT
PACK SURGICAL SETUP 50X90 (CUSTOM PROCEDURE TRAY) ×1 IMPLANT
PAD ARMBOARD 7.5X6 YLW CONV (MISCELLANEOUS) ×4 IMPLANT
PENCIL BUTTON HOLSTER BLD 10FT (ELECTRODE) ×1 IMPLANT
PIN SAFETY STERILE (MISCELLANEOUS) ×3 IMPLANT
SET ASEPTIC TRANSFER (MISCELLANEOUS) IMPLANT
STAPLER VISISTAT 35W (STAPLE) IMPLANT
SUT ETHILON 2 0 FS 18 (SUTURE) ×2 IMPLANT
SUT MON AB 3-0 SH 27 (SUTURE) ×6
SUT MON AB 3-0 SH27 (SUTURE) ×2 IMPLANT
SUT MON AB 4-0 PC3 18 (SUTURE) ×3 IMPLANT
SUT MON AB 5-0 PS2 18 (SUTURE) ×6 IMPLANT
SUT PDS AB 2-0 CT1 27 (SUTURE) IMPLANT
SUT SILK 3 0 SH 30 (SUTURE) ×5 IMPLANT
SUT VIC AB 3-0 SH 27 (SUTURE) ×9
SUT VIC AB 3-0 SH 27X BRD (SUTURE) ×6 IMPLANT
SUT VICRYL 4-0 PS2 18IN ABS (SUTURE) ×2 IMPLANT
SYR CONTROL 10ML LL (SYRINGE) ×3 IMPLANT
TOWEL OR 17X24 6PK STRL BLUE (TOWEL DISPOSABLE) ×2 IMPLANT
TOWEL OR 17X26 10 PK STRL BLUE (TOWEL DISPOSABLE) ×4 IMPLANT
TRAY FOLEY CATH 14FRSI W/METER (CATHETERS) IMPLANT

## 2016-09-06 NOTE — Anesthesia Procedure Notes (Signed)
Procedure Name: Intubation Date/Time: 09/06/2016 7:53 AM Performed by: Jenne Campus Pre-anesthesia Checklist: Patient identified, Emergency Drugs available, Suction available and Patient being monitored Patient Re-evaluated:Patient Re-evaluated prior to inductionOxygen Delivery Method: Circle System Utilized Preoxygenation: Pre-oxygenation with 100% oxygen Intubation Type: IV induction Ventilation: Mask ventilation without difficulty and Oral airway inserted - appropriate to patient size Laryngoscope Size: Miller and 2 Grade View: Grade I Tube type: Oral Tube size: 7.0 mm Number of attempts: 1 Airway Equipment and Method: Stylet and Oral airway Placement Confirmation: ETT inserted through vocal cords under direct vision,  positive ETCO2 and breath sounds checked- equal and bilateral Secured at: 21 cm Tube secured with: Tape Dental Injury: Teeth and Oropharynx as per pre-operative assessment

## 2016-09-06 NOTE — Op Note (Signed)
DATE OF OPERATION: 09/06/2016  LOCATION: [facility]  PREOPERATIVE DIAGNOSIS: right breast mass  POSTOPERATIVE DIAGNOSIS: Same  PROCEDURE: Excision of right breast mass  SURGEON: Autumn Messing, MD  ASSISTANT:Tyaisha Cullom Sanger Anarosa Kubisiak, DO  PROCEDURE: Excision of breast tissue and skin  X (4 x 20 cm) with primary closure  SURGEON: Hazel Leveille Sanger Terre Zabriskie, DO  ASSISTANT: Autumn Messing, MD  EBL: less than 100 cc  CONDITION: Stable  COMPLICATIONS: None  INDICATION: The patient, Makayla Gibson, is a 55 y.o. female born on 01-Aug-1961, is here for treatment of a right breast mass.   PROCEDURE DETAILS:  The patient was seen prior to surgery and marked.  The IV antibiotics were given. The patient was taken to the operating room and given a general anesthetic. A standard time out was performed and all information was confirmed by those in the room. SCDs were placed.   She was prepped with chlorhexidine.  I assisted Dr. Marlou Starks with the excision of a large right breast mass for the duration of the case.  At the completion of the excision, I excised excess dusky skin 4 x 20 cm with a #10 blade.  The defect was closed with 3-0 Monocryl.  The drain was placed and secured with 4-0 Silk. The subcutaneous layers were closed with 4-0 and 5-0 Monocryl.  Dermabond was applied with a breast binder.  The patient was allowed to wake up and taken to recovery room in stable condition at the end of the case. The family was notified at the end of the case.

## 2016-09-06 NOTE — H&P (Signed)
Makayla Gibson [facility]  Location: [facility] Patient #: [id number] DOB: 1960/11/14 Married / Language: English / Race: White Female   History of Present Illness  The patient is a 55 year old female who presents with a breast mass. We are asked to see the patient in consultation by Dr. Marla Roe to evaluate her for a right breast mass. The patient is a 55 year old white female who has had a mass in the upper outer right breast for more than the last year. She tried to have it excised by Dr. Arnoldo Morale last year but it rapidly returned. Since that time it has grown in size. She has had some redness associated with it. She has been restarted on antibiotics and has had some improvement in the redness. She recently saw plastic surgery for possible reduction and removal of the mass. The area has had the appearance of an abscess on ultrasound. It is too large to measure by ultrasound but probably covers an area about 10 inches long   Other Problems  Alcohol Abuse Arthritis Back Pain Bladder Problems Chest pain Gastroesophageal Reflux Disease General anesthesia - complications Home Oxygen Use Hypercholesterolemia Lump In Breast Sleep Apnea Thyroid Disease  Past Surgical History  Breast Biopsy Right. Oral Surgery  Diagnostic Studies History  Colonoscopy never Mammogram within last year Pap Smear 1-5 years ago  Allergies  Keflex *CEPHALOSPORINS* Percodan *ANALGESICS - OPIOID* Percocet *ANALGESICS - OPIOID*  Medication History  Atorvastatin Calcium (40MG  Tablet, Oral) Active. Ciprofloxacin HCl (500MG  Tablet, Oral) Active. Furosemide (40MG  Tablet, Oral) Active. Levothyroxine Sodium (100MCG Tablet, Oral) Active. Potassium Chloride ER (10MEQ Capsule ER, Oral) Active. Ferrocite (324MG  Tablet, Oral) Active. Medications Reconciled  Social History  Alcohol use Remotely quit alcohol use. Caffeine use Coffee. Tobacco use Former  smoker.  Family History  Alcohol Abuse Father. Arthritis Brother. Bleeding disorder Mother. Cancer Brother. Depression Mother. Diabetes Mellitus Family Members In General. Heart Disease Mother. Heart disease in female family member before age 49 Ischemic Bowel Disease Brother.  Pregnancy / Birth History  Age at menarche 29 years. Age of menopause 40-50 Contraceptive History Oral contraceptives. Gravida 4 Irregular periods Maternal age 27-20 Para 4    Review of Systems  General Not Present- Appetite Loss, Chills, Fatigue, Fever, Night Sweats, Weight Gain and Weight Loss. Skin Not Present- Change in Wart/Mole, Dryness, Hives, Jaundice, New Lesions, Non-Healing Wounds, Rash and Ulcer. HEENT Present- Wears glasses/contact lenses. Not Present- Earache, Hearing Loss, Hoarseness, Nose Bleed, Oral Ulcers, Ringing in the Ears, Seasonal Allergies, Sinus Pain, Sore Throat, Visual Disturbances and Yellow Eyes. Breast Present- Breast Mass, Breast Pain and Skin Changes. Not Present- Nipple Discharge. Cardiovascular Present- Swelling of Extremities. Not Present- Chest Pain, Difficulty Breathing Lying Down, Leg Cramps, Palpitations, Rapid Heart Rate and Shortness of Breath. Gastrointestinal Present- Constipation. Not Present- Abdominal Pain, Bloating, Bloody Stool, Change in Bowel Habits, Chronic diarrhea, Difficulty Swallowing, Excessive gas, Gets full quickly at meals, Hemorrhoids, Indigestion, Nausea, Rectal Pain and Vomiting. Female Genitourinary Present- Urgency. Not Present- Frequency, Nocturia, Painful Urination and Pelvic Pain. Musculoskeletal Present- Back Pain, Joint Pain, Joint Stiffness and Swelling of Extremities. Not Present- Muscle Pain and Muscle Weakness. Neurological Present- Numbness. Not Present- Decreased Memory, Fainting, Headaches, Seizures, Tingling, Tremor, Trouble walking and Weakness. Psychiatric Not Present- Anxiety, Bipolar, Change in Sleep Pattern,  Depression, Fearful and Frequent crying. Endocrine Present- Hot flashes. Not Present- Cold Intolerance, Excessive Hunger, Hair Changes, Heat Intolerance and New Diabetes. Hematology Not Present- Blood Thinners, Easy Bruising, Excessive bleeding, Gland problems, HIV and Persistent Infections.  Vitals Weight: 218.6 lb Height: 64in Body Surface Area: 2.03 m Body Mass Index: 37.52 kg/m  Temp.: 98.81F(Temporal)  Pulse: 79 (Regular)  BP: 126/80 (Sitting, Left Arm, Standard)       Physical Exam  General Mental Status-Alert. General Appearance-Consistent with stated age. Hydration-Well hydrated. Voice-Normal.  Head and Neck Head-normocephalic, atraumatic with no lesions or palpable masses. Trachea-midline. Thyroid Gland Characteristics - normal size and consistency.  Eye Eyeball - Bilateral-Extraocular movements intact. Sclera/Conjunctiva - Bilateral-No scleral icterus.  Chest and Lung Exam Chest and lung exam reveals -quiet, even and easy respiratory effort with no use of accessory muscles and on auscultation, normal breath sounds, no adventitious sounds and normal vocal resonance. Inspection Chest Wall - Normal. Back - normal.  Breast Note: There is a very large mass probably measuring about 10 inches encompassing most of the central and lateral right breast. There are no overlying skin changes. There is tenderness to palpation. There is no palpable mass in the left breast. There is no palpable axillary, supraclavicular, or cervical lymphadenopathy.   Cardiovascular Cardiovascular examination reveals -normal heart sounds, regular rate and rhythm with no murmurs and normal pedal pulses bilaterally.  Abdomen Inspection Inspection of the abdomen reveals - No Hernias. Skin - Scar - no surgical scars. Palpation/Percussion Palpation and Percussion of the abdomen reveal - Soft, Non Tender, No Rebound tenderness, No Rigidity (guarding) and No  hepatosplenomegaly. Auscultation Auscultation of the abdomen reveals - Bowel sounds normal.  Neurologic Neurologic evaluation reveals -alert and oriented x 3 with no impairment of recent or remote memory. Mental Status-Normal.  Musculoskeletal Normal Exam - Left-Upper Extremity Strength Normal and Lower Extremity Strength Normal. Normal Exam - Right-Upper Extremity Strength Normal and Lower Extremity Strength Normal.  Lymphatic Head & Neck  General Head & Neck Lymphatics: Bilateral - Description - Normal. Axillary  General Axillary Region: Bilateral - Description - Normal. Tenderness - Non Tender. Femoral & Inguinal  Generalized Femoral & Inguinal Lymphatics: Bilateral - Description - Normal. Tenderness - Non Tender.    Assessment & Plan  LARGE MASS OF RIGHT BREAST (N63.10) Impression: The patient appears to have a large mass encompassing most of the right breast which is likely a chronic abscess. At this point if the mass had to be removed her best option would likely be a mastectomy. She would be a candidate for possible reconstruction. I will discuss the options with the plastic surgeons and we will get back in touch with her to see what she would like to do. Because of the risk of infection we may have to stage a reconstruction unless the plastic surgeons felt like they could do an effective reduction and still give a shapely breast.

## 2016-09-06 NOTE — Anesthesia Preprocedure Evaluation (Addendum)
Anesthesia Evaluation  Patient identified by MRN, date of birth, ID band Patient awake    Reviewed: Allergy & Precautions, NPO status , Patient's Chart, lab work & pertinent test results  Airway Mallampati: II  TM Distance: >3 FB Neck ROM: Full    Dental no notable dental hx. (+) Teeth Intact, Dental Advisory Given   Pulmonary sleep apnea and Continuous Positive Airway Pressure Ventilation , former smoker,    Pulmonary exam normal breath sounds clear to auscultation       Cardiovascular negative cardio ROS Normal cardiovascular exam Rhythm:Regular Rate:Normal     Neuro/Psych negative neurological ROS  negative psych ROS   GI/Hepatic negative GI ROS, Neg liver ROS,   Endo/Other  Hypothyroidism   Renal/GU negative Renal ROS  negative genitourinary   Musculoskeletal negative musculoskeletal ROS (+)   Abdominal   Peds negative pediatric ROS (+)  Hematology negative hematology ROS (+)   Anesthesia Other Findings   Reproductive/Obstetrics negative OB ROS                            Anesthesia Physical Anesthesia Plan  ASA: III  Anesthesia Plan: General   Post-op Pain Management:    Induction: Intravenous  Airway Management Planned: Oral ETT  Additional Equipment:   Intra-op Plan:   Post-operative Plan: Extubation in OR  Informed Consent: I have reviewed the patients History and Physical, chart, labs and discussed the procedure including the risks, benefits and alternatives for the proposed anesthesia with the patient or authorized representative who has indicated his/her understanding and acceptance.   Dental advisory given  Plan Discussed with: CRNA and Surgeon  Anesthesia Plan Comments:        Anesthesia Quick Evaluation

## 2016-09-06 NOTE — H&P (Signed)
Makayla Gibson is an 55 y.o. female.   Chief Complaint: right breast mass HPI: Makayla Gibson is a 55 yrs old wf here for right breast mass.  She states that 2 days ago a blister formed on Makayla skin.  She then noticed some drainage and she facilitated a large amount of serosanginous drainage.  Makayla area is much softer and Makayla mass seems to have improved a great deal.  There is no sign of infection and no redness.  There is still mild clear drainage noted at Makayla skin with a 3 cm area of skin opening / scab.  History: Her breasts are extremely large and fairly symmetric. She has hyperpigmentation of Makayla inframammary area on both sides. Makayla sternal to nipple distance on Makayla right is 45and Makayla left is 47. Makayla IMF distance is 20cm. She is 5 feet 3inches tall and weighs 215pounds. Preoperative bra size = 44DDDcup. Makayla estimated excess breast tissue to be removed at Makayla time of surgery = 800grams on Makayla left and 800grams on Makayla right.  Past Medical History:  Diagnosis Date  . Anemia   . Arthritis   . Heart murmur   . Hyperthyroidism   . Sleep apnea   . Thyroid disease 11/2014    Past Surgical History:  Procedure Laterality Date  . APPENDECTOMY    . CESAREAN SECTION    . INCISION AND DRAINAGE ABSCESS Right 07/29/2015   Procedure: INCISION AND DRAINAGE BREAST ABSCESS;  Surgeon: Aviva Signs, MD;  Location: [facility];  Service: General;  Laterality: Right;    Family History  Problem Relation Age of Onset  . Rheumatic fever Mother   . Heart disease Mother   . Alcohol abuse Father   . Cancer Brother     liver  . Osteoporosis Brother    Social History:  reports that she quit smoking about 7 years ago. She has never used smokeless tobacco. She reports that she does not drink alcohol or use drugs.  Allergies:  Allergies  Allergen Reactions  . Keflex [Cephalexin] Itching and Rash  . Percocet [Oxycodone-Acetaminophen] Itching and Rash  . Percodan [Oxycodone-Aspirin] Itching and Rash      Medications Prior to Admission  Medication Sig Dispense Refill  . acetaminophen (TYLENOL) 500 MG tablet Take 500 mg by mouth every 6 (six) hours as needed (for pain.).    Marland Kitchen atorvastatin (LIPITOR) 40 MG tablet TAKE 1 TABLET (40 MG TOTAL) BY MOUTH DAILY. TAKE FOR CHOLESTEROL CONTROL (Gibson taking differently: Take 40 mg by mouth daily at 6 PM. Take for cholesterol control) 90 tablet 0  . FERROCITE 324 MG TABS tablet TAKE 1 TABLET BY MOUTH TWICE A DAY 360 tablet 0  . furosemide (LASIX) 40 MG tablet TAKE 1 TABLET BY MOUTH EVERY DAY 30 tablet 5  . levothyroxine (SYNTHROID, LEVOTHROID) 100 MCG tablet TAKE 1 TABLET BY MOUTH EVERY DAY 30 tablet 8  . Naphazoline-Glycerin (CLEAR EYES MAX REDNESS RELIEF) 0.03-0.5 % SOLN Place 2 drops into both eyes 3 (three) times daily as needed (for redness/irritation.).     Marland Kitchen polyethylene glycol (MIRALAX / GLYCOLAX) packet Take 17 g by mouth daily as needed for mild constipation.    . potassium chloride (K-DUR,KLOR-CON) 10 MEQ tablet Take 1 tablet (10 mEq total) by mouth daily. 30 tablet 5  . sulfamethoxazole-trimethoprim (BACTRIM DS) 800-160 MG tablet Take 1 tablet by mouth 2 (two) times daily. 28 tablet 0  . ciprofloxacin (CIPRO) 500 MG tablet TAKE 1 TABLET (500 MG TOTAL) BY MOUTH  2 TIMES DAILY.  0  . HYDROcodone-acetaminophen (NORCO/VICODIN) 5-325 MG tablet Take 1 tablet by mouth every 6 (six) hours as needed for moderate pain. Will start after procedure  0  . Multiple Vitamin (MULTIVITAMIN WITH MINERALS) TABS tablet Take 1 tablet by mouth daily.    . ondansetron (ZOFRAN) 4 MG tablet TAKE 1 TABLET EVERY 8 HOURS AS NEEDED FOR UP TO FIVE DAYS FOR NAUSEA/VOMITING  0  . potassium chloride (MICRO-K) 10 MEQ CR capsule TAKE ONE CAPSULE BY MOUTH EVERY DAY (Gibson not taking: Reported on 08/28/2016) 30 capsule 4  . traMADol (ULTRAM) 50 MG tablet Take 1 tablet (50 mg total) by mouth 4 (four) times daily as needed for moderate pain. (Gibson not taking: Reported on  08/28/2016) 60 tablet 02    No results found for this or any previous visit (from Makayla past 48 hour(s)). No results found.  Review of Systems  Constitutional: Negative.   HENT: Negative.   Eyes: Negative.   Respiratory: Negative.   Cardiovascular: Negative.   Gastrointestinal: Negative.   Genitourinary: Negative.   Musculoskeletal: Negative.   Skin: Negative.   Neurological: Negative.   Psychiatric/Behavioral: Negative.     Blood pressure (!) 107/58, pulse (!) 59, temperature 98.7 F (37.1 C), temperature source Oral, resp. rate 18, height 5\' 4"  (1.626 m), weight 95.7 kg (211 lb), SpO2 96 %. Physical Exam  Constitutional: She is oriented to person, place, and time. She appears well-developed and well-nourished.  HENT:  Head: Normocephalic and atraumatic.  Eyes: EOM are normal. Pupils are equal, round, and reactive to light.  Cardiovascular: Normal rate.   Respiratory: Effort normal.    GI: Soft. She exhibits no distension. There is no tenderness.  Neurological: She is alert and oriented to person, place, and time.  Skin: Skin is warm.  Psychiatric: She has a normal mood and affect. Her behavior is normal. Judgment and thought content normal.     Assessment/Plan Plan for tissue rearrangement versus closure after resection of right breast tumor.  Wallace Going, DO 09/06/2016, 7:23 AM

## 2016-09-06 NOTE — Interval H&P Note (Signed)
History and Physical Interval Note:  09/06/2016 7:21 AM  Makayla Gibson  has presented today for surgery, with the diagnosis of RIGHT BREAST MASS 10 INCHES  The various methods of treatment have been discussed with the patient and family. After consideration of risks, benefits and other options for treatment, the patient has consented to  Procedure(s): RIGHT BREAST LUMPECTOMY VS MASTECTOMY (Right) BREAST RECONSTRUCTION for closure after lumpectomy (Right) as a surgical intervention .  The patient's history has been reviewed, patient examined, no change in status, stable for surgery.  I have reviewed the patient's chart and labs.  Questions were answered to the patient's satisfaction.     TOTH III,PAUL S

## 2016-09-06 NOTE — Op Note (Addendum)
09/06/2016  9:18 AM  PATIENT:  Makayla Gibson  55 y.o. female  PRE-OPERATIVE DIAGNOSIS:  RIGHT BREAST MASS 10 INCHES  POST-OPERATIVE DIAGNOSIS:  RIGHT BREAST MASS 10 INCHES, chronic abscess cavity  PROCEDURE:  Procedure(s): RIGHT BREAST LUMPECTOMY OF LARGE RIGHT BREAST CHRONIC ABSCESS CAVITY (Right)  SURGEON:  Surgeon(s) and Role:    * Jovita Kussmaul, MD - Primary    * Loel Lofty Dillingham, DO - Assisting  PHYSICIAN ASSISTANT:   ASSISTANTS: Dr. Marla Roe   ANESTHESIA:   general  EBL:  Total I/O In: -  Out: 55 [Blood:50]  BLOOD ADMINISTERED:none  DRAINS: (1) Jackson-Pratt drain(s) with closed bulb suction in the right breast   LOCAL MEDICATIONS USED:  NONE  SPECIMEN:  Source of Specimen:  chronic right breast abscess cavity  DISPOSITION OF SPECIMEN:  PATHOLOGY  COUNTS:  YES  TOURNIQUET:  * No tourniquets in log *  DICTATION: .Dragon Dictation   After informed consent was obtained the patient was brought to the operating room and placed in the supine position on the operating room table. After adequate induction of general anesthesia the patient's right breast was prepped with ChloraPrep, allowed to dry, and draped in usual sterile manner. An appropriate timeout was performed. Initially breast reduction lines were mapped out on the surface of the breast by Dr. Marla Roe. There was a large mass in the lateral right breast. A transversely oriented incision was made overlying the palpable mass with a 10 blade knife making sure that we were within the boundaries of the reduction mapping. The incision was carried through the skin and subcutaneous tissues tissue sharply with the electrocautery. The cavity was then separated from the rest of the breast tissue via combination of blunt finger dissection and some sharp dissection with the electrocautery. The cavity was extremely large and was abutting the skin and the chest wall in several places. During the dissection the cavity  ruptured and we were able to evacuate a large amount of cloudy maroon fluid. Once the entire cavity was completely removed from the right breast and we then marked the cavity with a short stitch on the superior surface and a long stitch on the lateral surface. The specimen was then sent to pathology for further evaluation. Hemostasis was achieved using the Bovie electrocautery. Some of the overlying skin appeared to be ischemic and was resected back to more healthy-appearing skin sharply with a 10 blade knife. The wound was irrigated with copious amounts of saline. At this point the wound appeared to be relatively healthy and clean. Next a 36 [place] round Blake drain was placed in the operative bed. The tissue over the drain was closed with a layer of running 4-0 Monocryl stitch. The drain was anchored to the skin with a 3-0 silk stitch. The skin was then closed with a running 5-0 Monocryl subcuticular stitch. Dermabond dressings were applied. The drain was placed to bulb suction and there was a good seal. Sterile dressings and a breast binder were then applied. The patient tolerated the procedure well. At the end of the case all needle sponge and instrument counts were correct. The patient was then awakened and taken to recovery in stable condition. Dr. Elisabeth Cara was present and actively participated in the entire case. Her presence was integral to the success of the case.  PLAN OF CARE: Admit for overnight observation  PATIENT DISPOSITION:  PACU - hemodynamically stable.   Delay start of Pharmacological VTE agent (>24hrs) due to surgical blood loss or risk  of bleeding: no

## 2016-09-06 NOTE — Progress Notes (Signed)
Patient fitted for CPAP mask and states that she will place it on when she is ready. RT informed her to let the RN know if she has any trouble so we can help. RT will continue to monitor as needed.

## 2016-09-06 NOTE — Transfer of Care (Signed)
Immediate Anesthesia Transfer of Care Note  Patient: Makayla Gibson  Procedure(s) Performed: Procedure(s): EXCISION OF LARGE RIGHT BREAST CHRONIC ABSCESS CAVITY (Right)  Patient Location: PACU  Anesthesia Type:General  Level of Consciousness: awake, alert , oriented and patient cooperative  Airway & Oxygen Therapy: Patient Spontanous Breathing and Patient connected to face mask oxygen  Post-op Assessment: Report given to RN and Post -op Vital signs reviewed and stable  Post vital signs: Reviewed  Last Vitals:  Vitals:   09/06/16 0603 09/06/16 0928  BP: (!) 107/58 120/74  Pulse: (!) 59 78  Resp: 18 18  Temp: 37.1 C 36.4 C    Last Pain:  Vitals:   09/06/16 0928  TempSrc:   PainSc: 0-No pain      Patients Stated Pain Goal: 6 ([id number])  Complications: No apparent anesthesia complications

## 2016-09-06 NOTE — Anesthesia Postprocedure Evaluation (Signed)
Anesthesia Post Note  Patient: Makayla Gibson  Procedure(s) Performed: Procedure(s) (LRB): EXCISION OF LARGE RIGHT BREAST CHRONIC ABSCESS CAVITY (Right)  Patient location during evaluation: PACU Anesthesia Type: General Level of consciousness: awake and alert Pain management: pain level controlled Vital Signs Assessment: post-procedure vital signs reviewed and stable Respiratory status: spontaneous breathing, nonlabored ventilation, respiratory function stable and patient connected to nasal cannula oxygen Cardiovascular status: blood pressure returned to baseline and stable Postop Assessment: no signs of nausea or vomiting Anesthetic complications: no    Last Vitals:  Vitals:   09/06/16 0928 09/06/16 0943  BP: 120/74 135/85  Pulse: 78 76  Resp: 18 13  Temp: 36.4 C     Last Pain:  Vitals:   09/06/16 0928  TempSrc:   PainSc: 0-No pain                 Deardra Hinkley S

## 2016-09-06 NOTE — Progress Notes (Signed)
Pharmacy Antibiotic Note  Makayla Gibson is a 55 y.o. female admitted on 09/06/2016, s/p excision of right breast abscess cavity.  Pharmacy has been consulted for vancomycin dosing for surgical site/wound infection.  Pre-op labs reviewed.  Noted patient received vancomycin 1gm IV around 0730 today.  Plan: - Vanc 1250mg  IV Q12H - Monitor renal fxn, clinical progress, vanc trough if indicated  Height: 5\' 4"  (162.6 cm) Weight: 211 lb (95.7 kg) IBW/kg (Calculated) : 54.7  Temp (24hrs), Avg:98 F (36.7 C), Min:97.5 F (36.4 C), Max:98.7 F (37.1 C)   Recent Labs Lab 08/31/16 1341  WBC 6.3  CREATININE 0.60    Estimated Creatinine Clearance: 90.2 mL/min (by C-G formula based on SCr of 0.6 mg/dL).    Allergies  Allergen Reactions  . Keflex [Cephalexin] Itching and Rash  . Percocet [Oxycodone-Acetaminophen] Itching and Rash  . Percodan [Oxycodone-Aspirin] Itching and Rash    Antimicrobials this admission:  Vanc 12/7 >> Septra PTA  Dose adjustments this admission:  N/A  Microbiology results:  N/A   Finneus Kaneshiro D. Mina Marble, PharmD, BCPS Pager:  [phone] 09/06/2016, 1:08 PM

## 2016-09-06 NOTE — Brief Op Note (Signed)
09/06/2016  9:18 AM  PATIENT:  Makayla Gibson  55 y.o. female  PRE-OPERATIVE DIAGNOSIS:  RIGHT BREAST MASS 10 INCHES  POST-OPERATIVE DIAGNOSIS:  RIGHT BREAST MASS 10 INCHES  PROCEDURE:  Procedure(s): EXCISION OF LARGE RIGHT BREAST CHRONIC ABSCESS CAVITY (Right)  SURGEON:  Surgeon(s) and Role:    * Jovita Kussmaul, MD - Primary    * Loel Lofty Tawnee Clegg, DO - Assisting  PHYSICIAN ASSISTANT: Shawn Rayburn, PA  ASSISTANTS: none   ANESTHESIA:   general  EBL:  Total I/O In: -  Out: 50 [Blood:50]  BLOOD ADMINISTERED:none  DRAINS: (breast) Jackson-Pratt drain(s) with closed bulb suction in the right breast   LOCAL MEDICATIONS USED:  NONE  SPECIMEN:  Source of Specimen:  right breast  DISPOSITION OF SPECIMEN:  PATHOLOGY  COUNTS:  YES  TOURNIQUET:  * No tourniquets in log *  DICTATION: .Dragon Dictation  PLAN OF CARE: Observationi  PATIENT DISPOSITION:  PACU - hemodynamically stable.   Delay start of Pharmacological VTE agent (>24hrs) due to surgical blood loss or risk of bleeding: no

## 2016-09-07 ENCOUNTER — Encounter (HOSPITAL_COMMUNITY): Payer: Self-pay | Admitting: General Surgery

## 2016-09-07 DIAGNOSIS — Z881 Allergy status to other antibiotic agents status: Secondary | ICD-10-CM | POA: Diagnosis not present

## 2016-09-07 DIAGNOSIS — N6310 Unspecified lump in the right breast, unspecified quadrant: Secondary | ICD-10-CM | POA: Diagnosis not present

## 2016-09-07 DIAGNOSIS — Z9981 Dependence on supplemental oxygen: Secondary | ICD-10-CM | POA: Diagnosis not present

## 2016-09-07 DIAGNOSIS — M1990 Unspecified osteoarthritis, unspecified site: Secondary | ICD-10-CM | POA: Diagnosis not present

## 2016-09-07 DIAGNOSIS — N611 Abscess of the breast and nipple: Secondary | ICD-10-CM | POA: Diagnosis not present

## 2016-09-07 DIAGNOSIS — K219 Gastro-esophageal reflux disease without esophagitis: Secondary | ICD-10-CM | POA: Diagnosis not present

## 2016-09-07 DIAGNOSIS — Z885 Allergy status to narcotic agent status: Secondary | ICD-10-CM | POA: Diagnosis not present

## 2016-09-07 DIAGNOSIS — E7800 Pure hypercholesterolemia, unspecified: Secondary | ICD-10-CM | POA: Diagnosis not present

## 2016-09-07 DIAGNOSIS — E039 Hypothyroidism, unspecified: Secondary | ICD-10-CM | POA: Diagnosis not present

## 2016-09-07 DIAGNOSIS — L818 Other specified disorders of pigmentation: Secondary | ICD-10-CM | POA: Diagnosis not present

## 2016-09-07 DIAGNOSIS — Z87891 Personal history of nicotine dependence: Secondary | ICD-10-CM | POA: Diagnosis not present

## 2016-09-07 DIAGNOSIS — G4730 Sleep apnea, unspecified: Secondary | ICD-10-CM | POA: Diagnosis not present

## 2016-09-07 NOTE — Progress Notes (Signed)
1 Day Post-Op  Subjective: Patient reports her right breast is feeling much better following the abscess drainage.  JP drain out is serosanguinous and moderate.   Objective: Vital signs in last 24 hours: Temp:  [97.2 F (36.2 C)-98.6 F (37 C)] 97.8 F (36.6 C) (12/08 0442) Pulse Rate:  [59-122] 62 (12/08 0700) Resp:  [13-18] 18 (12/08 0442) BP: (93-140)/(50-85) 140/78 (12/08 0442) SpO2:  [94 %-99 %] 96 % (12/08 0442) Last BM Date: 09/05/16  Intake/Output from previous day: 12/07 0701 - 12/08 0700 In: 3335 [P.O.:1560; I.V.:1775] Out: 1750 [Urine:1650; Drains:50; Blood:50] Intake/Output this shift: Total I/O In: -  Out: 110 [Urine:100; Drains:10]  General appearance: alert, cooperative and no distress Right breast incision is clean, dry and intact. No signs of hematoma or seroma.   Lab Results:  No results for input(s): WBC, HGB, HCT, PLT in the last 72 hours. BMET No results for input(s): NA, K, CL, CO2, GLUCOSE, BUN, CREATININE, CALCIUM in the last 72 hours. PT/INR No results for input(s): LABPROT, INR in the last 72 hours. ABG No results for input(s): PHART, HCO3 in the last 72 hours.  Invalid input(s): PCO2, PO2  Studies/Results: No results found.  Anti-infectives: Anti-infectives    Start     Dose/Rate Route Frequency Ordered Stop   09/06/16 1700  vancomycin (VANCOCIN) 1,250 mg in sodium chloride 0.9 % 250 mL IVPB     1,250 mg 166.7 mL/hr over 90 Minutes Intravenous Every 12 hours 09/06/16 1308     09/06/16 0553  vancomycin (VANCOCIN) IVPB 1000 mg/200 mL premix     1,000 mg 200 mL/hr over 60 Minutes Intravenous On call to O.R. 09/06/16 0553 09/06/16 0835      Assessment/Plan: s/p Procedure(s): EXCISION OF LARGE RIGHT BREAST CHRONIC ABSCESS CAVITY (Right) For discharge today per Dr. Marlou Starks.   Patient has prescriptions for pain and antibiotics to begin at home. She has follow up arranged to see [place] in 1 week.    LOS: 0 days     RAYBURN,SHAWN,PA-C Plastic Surgery [phone]

## 2016-09-07 NOTE — Discharge Summary (Signed)
Physician Discharge Summary  Patient ID: Makayla Gibson MRN: [id number] DOB/AGE: [birth year] 54 y.o.  Admit date: 09/06/2016 Discharge date: 09/07/2016  Admission Diagnoses:  Discharge Diagnoses:  Active Problems:   Chronic abscess of breast   Discharged Condition: good  Hospital Course: the patient underwent a large lumpectomy of chronic abscess cavity. She tolerated surgery well. She has a drain in the cavity. She is instructed on drain care. On pod 1 she was ready for discharge home  Consults: None  Significant Diagnostic Studies: none  Treatments: surgery: as above  Discharge Exam: Blood pressure 140/78, pulse 62, temperature 97.8 F (36.6 C), temperature source Oral, resp. rate 18, height 5\' 4"  (1.626 m), weight 95.7 kg (211 lb), SpO2 96 %. Resp: clear to auscultation bilaterally Breasts: incision looks good Cardio: regular rate and rhythm GI: soft, non-tender; bowel sounds normal; no masses,  no organomegaly  Disposition: 01-Home or Self Care  Discharge Instructions    Call MD for:  difficulty breathing, headache or visual disturbances    Complete by:  As directed    Call MD for:  extreme fatigue    Complete by:  As directed    Call MD for:  hives    Complete by:  As directed    Call MD for:  persistant dizziness or light-headedness    Complete by:  As directed    Call MD for:  persistant nausea and vomiting    Complete by:  As directed    Call MD for:  redness, tenderness, or signs of infection (pain, swelling, redness, odor or green/yellow discharge around incision site)    Complete by:  As directed    Call MD for:  severe uncontrolled pain    Complete by:  As directed    Call MD for:  temperature >100.4    Complete by:  As directed    Diet - low sodium heart healthy    Complete by:  As directed    Discharge instructions    Complete by:  As directed    Sponge bathe while drain is in. Empty drain, record output, and recharge bulb twice a day. Activity  as tolerated   Increase activity slowly    Complete by:  As directed    No wound care    Complete by:  As directed        Medication List    TAKE these medications   acetaminophen 500 MG tablet Commonly known as:  TYLENOL Take 500 mg by mouth every 6 (six) hours as needed (for pain.).   atorvastatin 40 MG tablet Commonly known as:  LIPITOR TAKE 1 TABLET (40 MG TOTAL) BY MOUTH DAILY. TAKE FOR CHOLESTEROL CONTROL What changed:  when to take this  additional instructions   ciprofloxacin 500 MG tablet Commonly known as:  CIPRO TAKE 1 TABLET (500 MG TOTAL) BY MOUTH 2 TIMES DAILY.   CLEAR EYES MAX REDNESS RELIEF 0.03-0.5 % Soln Generic drug:  Naphazoline-Glycerin Place 2 drops into both eyes 3 (three) times daily as needed (for redness/irritation.).   FERROCITE 324 (106 Fe) MG Tabs tablet Generic drug:  Ferrous Fumarate TAKE 1 TABLET BY MOUTH TWICE A DAY   furosemide 40 MG tablet Commonly known as:  LASIX TAKE 1 TABLET BY MOUTH EVERY DAY   HYDROcodone-acetaminophen 5-325 MG tablet Commonly known as:  NORCO/VICODIN Take 1 tablet by mouth every 6 (six) hours as needed for moderate pain. Will start after procedure   levothyroxine 100 MCG tablet Commonly known as:  SYNTHROID, LEVOTHROID TAKE 1 TABLET BY MOUTH EVERY DAY   multivitamin with minerals Tabs tablet Take 1 tablet by mouth daily.   ondansetron 4 MG tablet Commonly known as:  ZOFRAN TAKE 1 TABLET EVERY 8 HOURS AS NEEDED FOR UP TO FIVE DAYS FOR NAUSEA/VOMITING   polyethylene glycol packet Commonly known as:  MIRALAX / GLYCOLAX Take 17 g by mouth daily as needed for mild constipation.   potassium chloride 10 MEQ CR capsule Commonly known as:  MICRO-K TAKE ONE CAPSULE BY MOUTH EVERY DAY   potassium chloride 10 MEQ tablet Commonly known as:  K-DUR,KLOR-CON Take 1 tablet (10 mEq total) by mouth daily.   sulfamethoxazole-trimethoprim 800-160 MG tablet Commonly known as:  BACTRIM DS Take 1 tablet by mouth 2  (two) times daily.   traMADol 50 MG tablet Commonly known as:  ULTRAM Take 1 tablet (50 mg total) by mouth 4 (four) times daily as needed for moderate pain.      Follow-up Information    TOTH Joneen Boers, MD.   Specialty:  General Surgery Contact information: [address] [place] [address] [phone]        CLAIRE S DILLINGHAM, DO In 1 week.   Specialty:  Plastic Surgery Contact information: [place] [address] [phone]           Signed: Merrie Roof 09/07/2016, 9:04 AM

## 2016-09-07 NOTE — Progress Notes (Signed)
1 Day Post-Op  Subjective: No complaints. Very happy to still have her breast  Objective: Vital signs in last 24 hours: Temp:  [97.2 F (36.2 C)-98.6 F (37 C)] 97.8 F (36.6 C) (12/08 0442) Pulse Rate:  [59-122] 62 (12/08 0700) Resp:  [13-18] 18 (12/08 0442) BP: (93-140)/(50-85) 140/78 (12/08 0442) SpO2:  [94 %-100 %] 96 % (12/08 0442) Last BM Date: 09/05/16  Intake/Output from previous day: 12/07 0701 - 12/08 0700 In: 3335 [P.O.:1560; I.V.:1775] Out: 1750 [Urine:1650; Drains:50; Blood:50] Intake/Output this shift: Total I/O In: -  Out: 110 [Urine:100; Drains:10]  Resp: clear to auscultation bilaterally Breasts: incision looks good. drain intact Cardio: regular rate and rhythm GI: soft, non-tender; bowel sounds normal; no masses,  no organomegaly  Lab Results:  No results for input(s): WBC, HGB, HCT, PLT in the last 72 hours. BMET No results for input(s): NA, K, CL, CO2, GLUCOSE, BUN, CREATININE, CALCIUM in the last 72 hours. PT/INR No results for input(s): LABPROT, INR in the last 72 hours. ABG No results for input(s): PHART, HCO3 in the last 72 hours.  Invalid input(s): PCO2, PO2  Studies/Results: No results found.  Anti-infectives: Anti-infectives    Start     Dose/Rate Route Frequency Ordered Stop   09/06/16 1700  vancomycin (VANCOCIN) 1,250 mg in sodium chloride 0.9 % 250 mL IVPB     1,250 mg 166.7 mL/hr over 90 Minutes Intravenous Every 12 hours 09/06/16 1308     09/06/16 0553  vancomycin (VANCOCIN) IVPB 1000 mg/200 mL premix     1,000 mg 200 mL/hr over 60 Minutes Intravenous On call to O.R. 09/06/16 0553 09/06/16 0835      Assessment/Plan: s/p Procedure(s): EXCISION OF LARGE RIGHT BREAST CHRONIC ABSCESS CAVITY (Right) Advance diet Discharge  Teach patient drain care  LOS: 0 days    TOTH III,PAUL S 09/07/2016

## 2016-09-07 NOTE — Progress Notes (Signed)
Makayla Gibson to be D/C'd Home per MD order.  Discussed with the patient and all questions fully answered.  VSS. Dressing clean dry and intact. JP drain charged and education provided to patient. Patient showed RN how to care for JP drain.   IV catheter discontinued intact. Site without signs and symptoms of complications. Dressing and pressure applied.  An After Visit Summary was printed and given to the patient. Patient received prescription.  D/c education completed with patient/family including follow up instructions, medication list, d/c activities limitations if indicated, with other d/c instructions as indicated by MD - patient able to verbalize understanding, all questions fully answered.   Patient instructed to return to ED, call 911, or call MD for any changes in condition.  L'ESPERANCE, Coralyn Roselli C 09/07/2016 1:47 PM

## 2016-09-14 ENCOUNTER — Encounter: Payer: Self-pay | Admitting: Family Medicine

## 2016-09-14 ENCOUNTER — Ambulatory Visit (INDEPENDENT_AMBULATORY_CARE_PROVIDER_SITE_OTHER): Payer: BLUE CROSS/BLUE SHIELD | Admitting: Family Medicine

## 2016-09-14 VITALS — BP 103/69 | HR 50 | Temp 98.3°F | Ht 64.0 in | Wt 213.4 lb

## 2016-09-14 DIAGNOSIS — E782 Mixed hyperlipidemia: Secondary | ICD-10-CM

## 2016-09-14 DIAGNOSIS — D509 Iron deficiency anemia, unspecified: Secondary | ICD-10-CM | POA: Diagnosis not present

## 2016-09-14 DIAGNOSIS — E038 Other specified hypothyroidism: Secondary | ICD-10-CM | POA: Diagnosis not present

## 2016-09-14 NOTE — Progress Notes (Signed)
Subjective:  Patient ID: Makayla Gibson, female    DOB: 09/03/1961  Age: 55 y.o. MRN: [id number]  CC: Hyperlipidemia (pt here for routine follow up )   HPI Makayla Gibson presents for follow-up of elevated cholesterol. Doing well without complaints on current medication. Denies side effects of statin including myalgia and arthralgia and nausea. Also in today for liver function testing. Currently no chest pain, shortness of breath or other cardiovascular related symptoms noted.Feeling much better since the huge breast abscess was excised last week.  Patient presents for follow-up on  thyroid. The patient has a history of hypothyroidism for many years. It has been stable recently. Pt. denies any change in  voice, loss of hair, heat or cold intolerance. Energy level has been adequate to good. Patient denies constipation and diarrhea. No myxedema. Medication is as noted below. Verified that pt is taking it daily on an empty stomach. Well tolerated.  History of iron deficiency anemia. Taking iron replacement twice daily. She has not been short of breath recently. She is not taking hydrocodone regularly although she has taken it occasionally since surgery. History Brittinie has a past medical history of Abscess of breast ([id number]); Anemia; Arthritis; Heart murmur; Hyperthyroidism; Sleep apnea; and Thyroid disease (11/2014).   She has a past surgical history that includes Cesarean section; Appendectomy; Incision and drainage abscess (Right, 07/29/2015); Breast cyst excision (Right, 09/06/2016); and Mastectomy, partial (Right, 09/06/2016).   Her family history includes Alcohol abuse in her father; Cancer in her brother; Heart disease in her mother; Osteoporosis in her brother; Rheumatic fever in her mother.She reports that she quit smoking about 7 years ago. She has never used smokeless tobacco. She reports that she does not drink alcohol or use drugs.  Current Outpatient Prescriptions on File  Prior to Visit  Medication Sig Dispense Refill  . acetaminophen (TYLENOL) 500 MG tablet Take 500 mg by mouth every 6 (six) hours as needed (for pain.).    Marland Kitchen atorvastatin (LIPITOR) 40 MG tablet TAKE 1 TABLET (40 MG TOTAL) BY MOUTH DAILY. TAKE FOR CHOLESTEROL CONTROL (Patient taking differently: Take 40 mg by mouth daily at 6 PM. Take for cholesterol control) 90 tablet 0  . FERROCITE 324 MG TABS tablet TAKE 1 TABLET BY MOUTH TWICE A DAY 360 tablet 0  . furosemide (LASIX) 40 MG tablet TAKE 1 TABLET BY MOUTH EVERY DAY 30 tablet 5  . HYDROcodone-acetaminophen (NORCO/VICODIN) 5-325 MG tablet Take 1 tablet by mouth every 6 (six) hours as needed for moderate pain. Will start after procedure  0  . levothyroxine (SYNTHROID, LEVOTHROID) 100 MCG tablet TAKE 1 TABLET BY MOUTH EVERY DAY 30 tablet 8  . Multiple Vitamin (MULTIVITAMIN WITH MINERALS) TABS tablet Take 1 tablet by mouth daily.    . Naphazoline-Glycerin (CLEAR EYES MAX REDNESS RELIEF) 0.03-0.5 % SOLN Place 2 drops into both eyes 3 (three) times daily as needed (for redness/irritation.).     Marland Kitchen polyethylene glycol (MIRALAX / GLYCOLAX) packet Take 17 g by mouth daily as needed for mild constipation.    . potassium chloride (MICRO-K) 10 MEQ CR capsule TAKE ONE CAPSULE BY MOUTH EVERY DAY 30 capsule 4  . ondansetron (ZOFRAN) 4 MG tablet TAKE 1 TABLET EVERY 8 HOURS AS NEEDED FOR UP TO FIVE DAYS FOR NAUSEA/VOMITING  0   No current facility-administered medications on file prior to visit.     ROS Review of Systems  Constitutional: Negative for activity change, appetite change and fever.  HENT: Negative for congestion, rhinorrhea  and sore throat.   Eyes: Negative for visual disturbance.  Respiratory: Negative for cough and shortness of breath.   Cardiovascular: Negative for chest pain and palpitations.  Gastrointestinal: Negative for abdominal pain, diarrhea and nausea.  Genitourinary: Negative for dysuria.  Musculoskeletal: Negative for arthralgias and  myalgias.    Objective:  BP 103/69   Pulse (!) 50   Temp 98.3 F (36.8 C) (Oral)   Ht '5\' 4"'$  (1.626 m)   Wt 213 lb 6 oz (96.8 kg)   BMI 36.63 kg/m   BP Readings from Last 3 Encounters:  09/14/16 103/69  09/07/16 (!) 120/58  08/31/16 97/64    Wt Readings from Last 3 Encounters:  09/14/16 213 lb 6 oz (96.8 kg)  09/06/16 211 lb (95.7 kg)  08/31/16 211 lb 3.2 oz (95.8 kg)     Physical Exam  Constitutional: She is oriented to person, place, and time. She appears well-developed and well-nourished. No distress.  HENT:  Head: Normocephalic and atraumatic.  Right Ear: External ear normal.  Left Ear: External ear normal.  Nose: Nose normal.  Mouth/Throat: Oropharynx is clear and moist.  Eyes: Conjunctivae and EOM are normal. Pupils are equal, round, and reactive to light.  Neck: Normal range of motion. Neck supple. No thyromegaly present.  Cardiovascular: Normal rate, regular rhythm and normal heart sounds.   No murmur heard. Pulmonary/Chest: Effort normal and breath sounds normal. No respiratory distress. She has no wheezes. She has no rales.  Abdominal: Soft. Bowel sounds are normal. She exhibits no distension. There is no tenderness.  Lymphadenopathy:    She has no cervical adenopathy.  Neurological: She is alert and oriented to person, place, and time. She has normal reflexes.  Skin: Skin is warm and dry.  Psychiatric: She has a normal mood and affect. Her behavior is normal. Judgment and thought content normal.    No results found for: HGBA1C  Lab Results  Component Value Date   WBC 6.3 08/31/2016   HGB 12.0 08/31/2016   HCT 36.4 08/31/2016   PLT 236 08/31/2016   GLUCOSE 95 08/31/2016   CHOL 151 03/14/2016   TRIG 110 03/14/2016   HDL 42 03/14/2016   LDLCALC 87 03/14/2016   ALT 19 03/14/2016   AST 14 03/14/2016   NA 139 08/31/2016   K 3.7 08/31/2016   CL 106 08/31/2016   CREATININE 0.60 08/31/2016   BUN 17 08/31/2016   CO2 25 08/31/2016   TSH 2.210  03/14/2016    No results found.  Assessment & Plan:   Evin was seen today for hyperlipidemia.  Diagnoses and all orders for this visit:  Other specified hypothyroidism -     CMP14+EGFR -     TSH + free T4  Iron deficiency anemia, unspecified iron deficiency anemia type -     CBC with Differential/Platelet  Mixed hyperlipidemia -     CMP14+EGFR -     Lipid panel   I have discontinued Ms. Devora's traMADol, sulfamethoxazole-trimethoprim, and ciprofloxacin. I am also having her maintain her multivitamin with minerals, atorvastatin, levothyroxine, furosemide, Naphazoline-Glycerin, acetaminophen, potassium chloride, FERROCITE, polyethylene glycol, ondansetron, and HYDROcodone-acetaminophen.  No orders of the defined types were placed in this encounter.    Follow-up: Return in about 6 months (around 03/15/2017).  Claretta Fraise, M.D.

## 2016-09-14 NOTE — Patient Instructions (Signed)
Carbohydrate Counting for Diabetes Mellitus, Adult Carbohydrate counting is a method for keeping track of how many carbohydrates you eat. Eating carbohydrates naturally increases the amount of sugar (glucose) in the blood. Counting how many carbohydrates you eat helps keep your blood glucose within normal limits, which helps you manage your diabetes (diabetes mellitus). It is important to know how many carbohydrates you can safely have in each meal. This is different for every person. A diet and nutrition specialist (registered dietitian) can help you make a meal plan and calculate how many carbohydrates you should have at each meal and snack. Carbohydrates are found in the following foods:  Grains, such as breads and cereals.  Dried beans and soy products.  Starchy vegetables, such as potatoes, peas, and corn.  Fruit and fruit juices.  Milk and yogurt.  Sweets and snack foods, such as cake, cookies, candy, chips, and soft drinks. How do I count carbohydrates? There are two ways to count carbohydrates in food. You can use either of the methods or a combination of both. Reading "Nutrition Facts" on packaged food  The "Nutrition Facts" list is included on the labels of almost all packaged foods and beverages in the [place] It includes:  The serving size.  Information about nutrients in each serving, including the grams (g) of carbohydrate per serving. To use the "Nutrition Facts":  Decide how many servings you will have.  Multiply the number of servings by the number of carbohydrates per serving.  The resulting number is the total amount of carbohydrates that you will be having. Learning standard serving sizes of other foods  When you eat foods containing carbohydrates that are not packaged or do not include "Nutrition Facts" on the label, you need to measure the servings in order to count the amount of carbohydrates:  Measure the foods that you will eat with a food scale or measuring  cup, if needed.  Decide how many standard-size servings you will eat.  Multiply the number of servings by 15. Most carbohydrate-rich foods have about 15 g of carbohydrates per serving.  For example, if you eat 8 oz (170 g) of strawberries, you will have eaten 2 servings and 30 g of carbohydrates (2 servings x 15 g = 30 g).  For foods that have more than one food mixed, such as soups and casseroles, you must count the carbohydrates in each food that is included. The following list contains standard serving sizes of common carbohydrate-rich foods. Each of these servings has about 15 g of carbohydrates:   hamburger bun or  English muffin.   oz (15 mL) syrup.   oz (14 g) jelly.  1 slice of bread.  1 six-inch tortilla.  3 oz (85 g) cooked rice or pasta.  4 oz (113 g) cooked dried beans.  4 oz (113 g) starchy vegetable, such as peas, corn, or potatoes.  4 oz (113 g) hot cereal.  4 oz (113 g) mashed potatoes or  of a large baked potato.  4 oz (113 g) canned or frozen fruit.  4 oz (120 mL) fruit juice.  4-6 crackers.  6 chicken nuggets.  6 oz (170 g) unsweetened dry cereal.  6 oz (170 g) plain fat-free yogurt or yogurt sweetened with artificial sweeteners.  8 oz (240 mL) milk.  8 oz (170 g) fresh fruit or one small piece of fruit.  24 oz (680 g) popped popcorn. Example of carbohydrate counting Sample meal  3 oz (85 g) chicken breast.  6 oz (  170 g) brown rice.  4 oz (113 g) corn.  8 oz (240 mL) milk.  8 oz (170 g) strawberries with sugar-free whipped topping. Carbohydrate calculation 1. Identify the foods that contain carbohydrates:  Rice.  Corn.  Milk.  Strawberries. 2. Calculate how many servings you have of each food:  2 servings rice.  1 serving corn.  1 serving milk.  1 serving strawberries. 3. Multiply each number of servings by 15 g:  2 servings rice x 15 g = 30 g.  1 serving corn x 15 g = 15 g.  1 serving milk x 15 g = 15  g.  1 serving strawberries x 15 g = 15 g. 4. Add together all of the amounts to find the total grams of carbohydrates eaten:  30 g + 15 g + 15 g + 15 g = 75 g of carbohydrates total. This information is not intended to replace advice given to you by your health care provider. Make sure you discuss any questions you have with your health care provider. Document Released: 09/17/2005 Document Revised: 04/06/2016 Document Reviewed: 02/29/2016 Elsevier Interactive Patient Education  2017 [employer]  

## 2016-09-15 LAB — CMP14+EGFR
A/G RATIO: 1.7 (ref 1.2–2.2)
ALBUMIN: 4.2 g/dL (ref 3.5–5.5)
ALT: 32 IU/L (ref 0–32)
AST: 18 IU/L (ref 0–40)
Alkaline Phosphatase: 68 IU/L (ref 39–117)
BILIRUBIN TOTAL: 0.3 mg/dL (ref 0.0–1.2)
BUN / CREAT RATIO: 45 — AB (ref 9–23)
BUN: 22 mg/dL (ref 6–24)
CALCIUM: 9.7 mg/dL (ref 8.7–10.2)
CHLORIDE: 102 mmol/L (ref 96–106)
CO2: 25 mmol/L (ref 18–29)
Creatinine, Ser: 0.49 mg/dL — ABNORMAL LOW (ref 0.57–1.00)
GFR, EST AFRICAN AMERICAN: 128 mL/min/{1.73_m2} (ref >59)
GFR, EST NON AFRICAN AMERICAN: 111 mL/min/{1.73_m2} (ref >59)
GLOBULIN, TOTAL: 2.5 g/dL (ref 1.5–4.5)
Glucose: 98 mg/dL (ref 65–99)
POTASSIUM: 4.8 mmol/L (ref 3.5–5.2)
SODIUM: 143 mmol/L (ref 134–144)
TOTAL PROTEIN: 6.7 g/dL (ref 6.0–8.5)

## 2016-09-15 LAB — CBC WITH DIFFERENTIAL/PLATELET
BASOS: 1 %
Basophils Absolute: 0.1 10*3/uL (ref 0.0–0.2)
EOS (ABSOLUTE): 0.2 10*3/uL (ref 0.0–0.4)
EOS: 2 %
HEMATOCRIT: 37.8 % (ref 34.0–46.6)
HEMOGLOBIN: 12.4 g/dL (ref 11.1–15.9)
IMMATURE GRANS (ABS): 0.1 10*3/uL (ref 0.0–0.1)
IMMATURE GRANULOCYTES: 1 %
LYMPHS: 30 %
Lymphocytes Absolute: 2.4 10*3/uL (ref 0.7–3.1)
MCH: 29.6 pg (ref 26.6–33.0)
MCHC: 32.8 g/dL (ref 31.5–35.7)
MCV: 90 fL (ref 79–97)
Monocytes Absolute: 0.5 10*3/uL (ref 0.1–0.9)
Monocytes: 7 %
NEUTROS ABS: 4.8 10*3/uL (ref 1.4–7.0)
NEUTROS PCT: 59 %
PLATELETS: 252 10*3/uL (ref 150–379)
RBC: 4.19 x10E6/uL (ref 3.77–5.28)
RDW: 14.2 % (ref 12.3–15.4)
WBC: 8 10*3/uL (ref 3.4–10.8)

## 2016-09-15 LAB — LIPID PANEL
CHOL/HDL RATIO: 3.2 ratio (ref 0.0–4.4)
CHOLESTEROL TOTAL: 164 mg/dL (ref 100–199)
HDL: 52 mg/dL (ref >39)
LDL CALC: 87 mg/dL (ref 0–99)
Triglycerides: 127 mg/dL (ref 0–149)
VLDL CHOLESTEROL CAL: 25 mg/dL (ref 5–40)

## 2016-09-15 LAB — TSH+FREE T4
Free T4: 1.33 ng/dL (ref 0.82–1.77)
TSH: 5.09 u[IU]/mL — ABNORMAL HIGH (ref 0.450–4.500)

## 2016-09-17 ENCOUNTER — Other Ambulatory Visit: Payer: Self-pay | Admitting: *Deleted

## 2016-09-17 ENCOUNTER — Telehealth: Payer: Self-pay | Admitting: Family Medicine

## 2016-09-17 DIAGNOSIS — E039 Hypothyroidism, unspecified: Secondary | ICD-10-CM

## 2016-09-17 NOTE — Telephone Encounter (Signed)
Pt aware of lab results 

## 2016-09-28 ENCOUNTER — Other Ambulatory Visit: Payer: Self-pay | Admitting: Family Medicine

## 2016-10-06 DIAGNOSIS — J9600 Acute respiratory failure, unspecified whether with hypoxia or hypercapnia: Secondary | ICD-10-CM | POA: Diagnosis not present

## 2016-10-19 ENCOUNTER — Other Ambulatory Visit: Payer: BLUE CROSS/BLUE SHIELD

## 2016-10-19 DIAGNOSIS — E039 Hypothyroidism, unspecified: Secondary | ICD-10-CM | POA: Diagnosis not present

## 2016-10-20 LAB — T4, FREE: FREE T4: 1.2 ng/dL (ref 0.82–1.77)

## 2016-10-20 LAB — TSH: TSH: 3.84 u[IU]/mL (ref 0.450–4.500)

## 2016-11-06 DIAGNOSIS — J9600 Acute respiratory failure, unspecified whether with hypoxia or hypercapnia: Secondary | ICD-10-CM | POA: Diagnosis not present

## 2016-11-21 ENCOUNTER — Other Ambulatory Visit: Payer: Self-pay | Admitting: Family Medicine

## 2016-11-22 ENCOUNTER — Ambulatory Visit (INDEPENDENT_AMBULATORY_CARE_PROVIDER_SITE_OTHER): Payer: BLUE CROSS/BLUE SHIELD | Admitting: Family Medicine

## 2016-11-22 ENCOUNTER — Encounter: Payer: Self-pay | Admitting: Family Medicine

## 2016-11-22 VITALS — BP 86/57 | HR 52 | Temp 100.2°F | Ht 64.0 in | Wt 215.0 lb

## 2016-11-22 DIAGNOSIS — J111 Influenza due to unidentified influenza virus with other respiratory manifestations: Secondary | ICD-10-CM

## 2016-11-22 DIAGNOSIS — R509 Fever, unspecified: Secondary | ICD-10-CM

## 2016-11-22 LAB — VERITOR FLU A/B WAIVED
INFLUENZA A: NEGATIVE
Influenza B: NEGATIVE

## 2016-11-22 MED ORDER — OSELTAMIVIR PHOSPHATE 75 MG PO CAPS: 75.0000 mg | ORAL_CAPSULE | Freq: Two times a day (BID) | ORAL | 0 refills | Status: DC

## 2016-11-22 NOTE — Progress Notes (Signed)
Subjective:  Patient ID: Makayla Gibson, female    DOB: 1960-12-25  Age: 57 y.o. MRN: [id number]  CC: Fever (pt here today c/o a fever of 103.2 yesterday and a scratchy throat and body aches. She has also been coughing a lot.)   HPI Makayla Gibson presents for  Patient presents with dry cough runny stuffy nose. Diffuse headache of moderate intensity. Patient also has chills and subjective fever. Body aches worst in the back but present in the legs, shoulders, and torso as well. Has sapped the energy to the point that of being unable to perform usual activities other than ADLs. Onset 2 days ago.    History Makayla Gibson has a past medical history of Abscess of breast QA:[id number]); Anemia; Arthritis; Heart murmur; Hyperthyroidism; Sleep apnea; and Thyroid disease (11/2014).   She has a past surgical history that includes Cesarean section; Appendectomy; Incision and drainage abscess (Right, 07/29/2015); Breast cyst excision (Right, 09/06/2016); and Mastectomy, partial (Right, 09/06/2016).   Her family history includes Alcohol abuse in her father; Cancer in her brother; Heart disease in her mother; Osteoporosis in her brother; Rheumatic fever in her mother.She reports that she quit smoking about 8 years ago. She has never used smokeless tobacco. She reports that she does not drink alcohol or use drugs.    ROS Review of Systems  Constitutional: Positive for appetite change, chills and fever.  HENT: Positive for congestion and rhinorrhea. Negative for ear pain, nosebleeds, postnasal drip, sinus pressure and sore throat.   Respiratory: Negative for chest tightness and shortness of breath.   Cardiovascular: Negative for chest pain.  Musculoskeletal: Positive for myalgias.  Skin: Negative for rash.  Neurological: Positive for headaches.    Objective:  BP (!) 86/57   Pulse (!) 52   Temp 100.2 F (37.9 C) (Oral)   Ht 5\' 4"  (1.626 m)   Wt 215 lb (97.5 kg)   BMI 36.90 kg/m   BP Readings  from Last 3 Encounters:  11/22/16 (!) 86/57  09/14/16 103/69  09/07/16 (!) 120/58    Wt Readings from Last 3 Encounters:  11/22/16 215 lb (97.5 kg)  09/14/16 213 lb 6 oz (96.8 kg)  09/06/16 211 lb (95.7 kg)     Physical Exam  Constitutional: She is oriented to person, place, and time. She appears well-developed and well-nourished. No distress.  Cardiovascular: Normal rate and regular rhythm.   Pulmonary/Chest: Breath sounds normal.  Neurological: She is alert and oriented to person, place, and time.  Skin: Skin is warm and dry.  Psychiatric: She has a normal mood and affect.    No results found.  Assessment & Plan:   Makayla Gibson was seen today for fever.  Diagnoses and all orders for this visit:  Fever, unspecified fever cause -     Veritor Flu A/B Waived  Influenza with respiratory manifestation  Other orders -     oseltamivir (TAMIFLU) 75 MG capsule; Take 1 capsule (75 mg total) by mouth 2 (two) times daily.    I have discontinued Makayla Gibson's ondansetron and HYDROcodone-acetaminophen. I am also having her start on oseltamivir. Additionally, I am having her maintain her multivitamin with minerals, levothyroxine, furosemide, Naphazoline-Glycerin, acetaminophen, potassium chloride, FERROCITE, polyethylene glycol, and atorvastatin.  Allergies as of 11/22/2016      Reactions   Keflex [cephalexin] Itching, Rash   Percocet [oxycodone-acetaminophen] Itching, Rash   Percodan [oxycodone-aspirin] Itching, Rash      Medication List       Accurate as of 11/22/16  9:21 AM. Always use your most recent med list.          acetaminophen 500 MG tablet Commonly known as:  TYLENOL Take 500 mg by mouth every 6 (six) hours as needed (for pain.).   atorvastatin 40 MG tablet Commonly known as:  LIPITOR TAKE 1 TABLET (40 MG TOTAL) BY MOUTH DAILY. TAKE FOR CHOLESTEROL CONTROL   CLEAR EYES MAX REDNESS RELIEF 0.03-0.5 % Soln Generic drug:  Naphazoline-Glycerin Place 2 drops into  both eyes 3 (three) times daily as needed (for redness/irritation.).   FERROCITE 324 (106 Fe) MG Tabs tablet Generic drug:  Ferrous Fumarate TAKE 1 TABLET BY MOUTH TWICE A DAY   furosemide 40 MG tablet Commonly known as:  LASIX TAKE 1 TABLET BY MOUTH EVERY DAY   levothyroxine 100 MCG tablet Commonly known as:  SYNTHROID, LEVOTHROID TAKE 1 TABLET BY MOUTH EVERY DAY   multivitamin with minerals Tabs tablet Take 1 tablet by mouth daily.   oseltamivir 75 MG capsule Commonly known as:  TAMIFLU Take 1 capsule (75 mg total) by mouth 2 (two) times daily.   polyethylene glycol packet Commonly known as:  MIRALAX / GLYCOLAX Take 17 g by mouth daily as needed for mild constipation.   potassium chloride 10 MEQ CR capsule Commonly known as:  MICRO-K TAKE ONE CAPSULE BY MOUTH EVERY DAY        Follow-up: Return if symptoms worsen or fail to improve, for And as previously scheduled on June 15, Hypothyroidism, cholesterol.  Claretta Fraise, M.D.

## 2016-11-22 NOTE — Patient Instructions (Signed)

## 2016-12-04 DIAGNOSIS — J9600 Acute respiratory failure, unspecified whether with hypoxia or hypercapnia: Secondary | ICD-10-CM | POA: Diagnosis not present

## 2016-12-18 ENCOUNTER — Other Ambulatory Visit: Payer: Self-pay | Admitting: Family Medicine

## 2016-12-18 ENCOUNTER — Telehealth: Payer: Self-pay | Admitting: Family Medicine

## 2016-12-18 MED ORDER — POTASSIUM CHLORIDE ER 10 MEQ PO CPCR: 10.0000 meq | ORAL_CAPSULE | Freq: Every day | ORAL | 4 refills | Status: DC

## 2016-12-18 NOTE — Telephone Encounter (Signed)
Please contact the patient she should continue taking the potassium since she also takes furosemide

## 2016-12-18 NOTE — Telephone Encounter (Signed)
Patient aware that potassium has been sent to pharmacy

## 2016-12-18 NOTE — Telephone Encounter (Signed)
What is the name of the medication? Potassium. She is not due to be seen yet she says.  Have you contacted your pharmacy to request a refill? yes  Which pharmacy would you like this sent to? [facility]   Patient notified that their request is being sent to the clinical staff for review and that they should receive a call once it is complete. If they do not receive a call within 24 hours they can check with their pharmacy or our office.

## 2016-12-24 ENCOUNTER — Other Ambulatory Visit: Payer: Self-pay | Admitting: Family Medicine

## 2017-01-04 DIAGNOSIS — J9600 Acute respiratory failure, unspecified whether with hypoxia or hypercapnia: Secondary | ICD-10-CM | POA: Diagnosis not present

## 2017-02-03 DIAGNOSIS — J9600 Acute respiratory failure, unspecified whether with hypoxia or hypercapnia: Secondary | ICD-10-CM | POA: Diagnosis not present

## 2017-02-13 ENCOUNTER — Other Ambulatory Visit: Payer: Self-pay | Admitting: Family Medicine

## 2017-03-04 DIAGNOSIS — H40033 Anatomical narrow angle, bilateral: Secondary | ICD-10-CM | POA: Diagnosis not present

## 2017-03-04 DIAGNOSIS — G44219 Episodic tension-type headache, not intractable: Secondary | ICD-10-CM | POA: Diagnosis not present

## 2017-03-06 DIAGNOSIS — J9600 Acute respiratory failure, unspecified whether with hypoxia or hypercapnia: Secondary | ICD-10-CM | POA: Diagnosis not present

## 2017-03-15 ENCOUNTER — Other Ambulatory Visit: Payer: Self-pay | Admitting: Family Medicine

## 2017-03-15 ENCOUNTER — Ambulatory Visit (INDEPENDENT_AMBULATORY_CARE_PROVIDER_SITE_OTHER): Payer: BLUE CROSS/BLUE SHIELD

## 2017-03-15 ENCOUNTER — Encounter: Payer: Self-pay | Admitting: Family Medicine

## 2017-03-15 ENCOUNTER — Ambulatory Visit (INDEPENDENT_AMBULATORY_CARE_PROVIDER_SITE_OTHER): Payer: BLUE CROSS/BLUE SHIELD | Admitting: Family Medicine

## 2017-03-15 VITALS — BP 101/56 | HR 51 | Temp 98.7°F | Ht 64.0 in | Wt 222.0 lb

## 2017-03-15 DIAGNOSIS — Z780 Asymptomatic menopausal state: Secondary | ICD-10-CM

## 2017-03-15 DIAGNOSIS — E038 Other specified hypothyroidism: Secondary | ICD-10-CM | POA: Diagnosis not present

## 2017-03-15 DIAGNOSIS — E782 Mixed hyperlipidemia: Secondary | ICD-10-CM | POA: Diagnosis not present

## 2017-03-15 DIAGNOSIS — D509 Iron deficiency anemia, unspecified: Secondary | ICD-10-CM

## 2017-03-15 DIAGNOSIS — M810 Age-related osteoporosis without current pathological fracture: Secondary | ICD-10-CM | POA: Diagnosis not present

## 2017-03-15 MED ORDER — ALENDRONATE SODIUM 70 MG PO TABS: 70.0000 mg | ORAL_TABLET | ORAL | 11 refills | Status: DC

## 2017-03-15 NOTE — Progress Notes (Signed)
Subjective:  Patient ID: Makayla Gibson, female    DOB: 1961-02-20  Age: 56 y.o. MRN: [id number]  CC: Follow up chronic medical problems   HPI Makayla Gibson presents for Patient presents for follow-up on  thyroid. The patient has a history of hypothyroidism for many years. It has been stable recently. Pt. denies any change in  voice, loss of hair, heat or cold intolerance. Energy level has been adequate to good. Patient denies constipation and diarrhea. No myxedema. Medication is as noted below. Verified that pt is taking it daily on an empty stomach. Well tolerated. Patient in for follow-up of elevated cholesterol. Doing well without complaints on current medication. Denies side effects of statin including myalgia and arthralgia and nausea. Also in today for liver function testing. Currently no chest pain, shortness of breath or other cardiovascular related symptoms noted.   History Makayla Gibson has a past medical history of Abscess of breast ([id number]); Anemia; Arthritis; Heart murmur; Hyperthyroidism; Sleep apnea; and Thyroid disease (11/2014).   She has a past surgical history that includes Cesarean section; Appendectomy; Incision and drainage abscess (Right, 07/29/2015); Breast cyst excision (Right, 09/06/2016); and Mastectomy, partial (Right, 09/06/2016).   Her family history includes Alcohol abuse in her father; Cancer in her brother; Heart disease in her mother; Osteoporosis in her brother; Rheumatic fever in her mother.She reports that she quit smoking about 8 years ago. She has never used smokeless tobacco. She reports that she does not drink alcohol or use drugs.    ROS Review of Systems  Constitutional: Negative for activity change, appetite change and fever.  HENT: Negative for congestion, rhinorrhea and sore throat.   Eyes: Negative for visual disturbance.  Respiratory: Negative for cough and shortness of breath.   Cardiovascular: Negative for chest pain and palpitations.   Gastrointestinal: Negative for abdominal pain, diarrhea and nausea.  Genitourinary: Negative for dysuria.  Musculoskeletal: Negative for arthralgias and myalgias.    Objective:  BP (!) 101/56   Pulse (!) 51   Temp 98.7 F (37.1 C) (Oral)   Ht '5\' 4"'$  (1.626 m)   Wt 222 lb (100.7 kg)   BMI 38.11 kg/m   BP Readings from Last 3 Encounters:  03/15/17 (!) 101/56  11/22/16 (!) 86/57  09/14/16 103/69    Wt Readings from Last 3 Encounters:  03/15/17 222 lb (100.7 kg)  11/22/16 215 lb (97.5 kg)  09/14/16 213 lb 6 oz (96.8 kg)     Physical Exam  Constitutional: She is oriented to person, place, and time. She appears well-developed and well-nourished. No distress.  HENT:  Head: Normocephalic and atraumatic.  Right Ear: External ear normal.  Left Ear: External ear normal.  Nose: Nose normal.  Mouth/Throat: Oropharynx is clear and moist.  Eyes: Conjunctivae and EOM are normal. Pupils are equal, round, and reactive to light.  Neck: Normal range of motion. Neck supple. No thyromegaly present.  Cardiovascular: Normal rate, regular rhythm and normal heart sounds.   No murmur heard. Pulmonary/Chest: Effort normal and breath sounds normal. No respiratory distress. She has no wheezes. She has no rales.  Abdominal: Soft. Bowel sounds are normal. She exhibits no distension. There is no tenderness.  Lymphadenopathy:    She has no cervical adenopathy.  Neurological: She is alert and oriented to person, place, and time. She has normal reflexes.  Skin: Skin is warm and dry.  Psychiatric: She has a normal mood and affect. Her behavior is normal. Judgment and thought content normal.  Assessment & Plan:   Makayla Gibson was seen today for follow up chronic medical problems.  Diagnoses and all orders for this visit:  Other specified hypothyroidism -     CMP14+EGFR -     TSH + free T4  Iron deficiency anemia, unspecified iron deficiency anemia type -     CBC with  Differential/Platelet  Mixed hyperlipidemia -     CMP14+EGFR -     Lipid panel       I have discontinued Makayla Gibson's oseltamivir. I am also having her maintain her multivitamin with minerals, Naphazoline-Glycerin, acetaminophen, polyethylene glycol, furosemide, potassium chloride, atorvastatin, levothyroxine, and FERROCITE.  Allergies as of 03/15/2017      Reactions   Keflex [cephalexin] Itching, Rash   Percocet [oxycodone-acetaminophen] Itching, Rash   Percodan [oxycodone-aspirin] Itching, Rash      Medication List       Accurate as of 03/15/17 10:47 AM. Always use your most recent med list.          acetaminophen 500 MG tablet Commonly known as:  TYLENOL Take 500 mg by mouth every 6 (six) hours as needed (for pain.).   atorvastatin 40 MG tablet Commonly known as:  LIPITOR TAKE 1 TABLET (40 MG TOTAL) BY MOUTH DAILY. TAKE FOR CHOLESTEROL CONTROL   CLEAR EYES MAX REDNESS RELIEF 0.03-0.5 % Soln Generic drug:  Naphazoline-Glycerin Place 2 drops into both eyes 3 (three) times daily as needed (for redness/irritation.).   FERROCITE 324 (106 Fe) MG Tabs tablet Generic drug:  Ferrous Fumarate TAKE 1 TABLET BY MOUTH TWICE A DAY   furosemide 40 MG tablet Commonly known as:  LASIX TAKE 1 TABLET BY MOUTH EVERY DAY   levothyroxine 100 MCG tablet Commonly known as:  SYNTHROID, LEVOTHROID TAKE 1 TABLET BY MOUTH EVERY DAY   multivitamin with minerals Tabs tablet Take 1 tablet by mouth daily.   polyethylene glycol packet Commonly known as:  MIRALAX / GLYCOLAX Take 17 g by mouth daily as needed for mild constipation.   potassium chloride 10 MEQ CR capsule Commonly known as:  MICRO-K Take 1 capsule (10 mEq total) by mouth daily.        Follow-up: Return in about 6 months (around 09/14/2017).  Claretta Fraise, M.D.

## 2017-03-16 LAB — LIPID PANEL
CHOL/HDL RATIO: 3.6 ratio (ref 0.0–4.4)
Cholesterol, Total: 145 mg/dL (ref 100–199)
HDL: 40 mg/dL (ref >39)
LDL CALC: 79 mg/dL (ref 0–99)
TRIGLYCERIDES: 132 mg/dL (ref 0–149)
VLDL CHOLESTEROL CAL: 26 mg/dL (ref 5–40)

## 2017-03-16 LAB — CMP14+EGFR
ALBUMIN: 4.4 g/dL (ref 3.5–5.5)
ALK PHOS: 69 IU/L (ref 39–117)
ALT: 23 IU/L (ref 0–32)
AST: 18 IU/L (ref 0–40)
Albumin/Globulin Ratio: 2.1 (ref 1.2–2.2)
BUN / CREAT RATIO: 33 — AB (ref 9–23)
BUN: 16 mg/dL (ref 6–24)
Bilirubin Total: 0.5 mg/dL (ref 0.0–1.2)
CO2: 27 mmol/L (ref 20–29)
CREATININE: 0.48 mg/dL — AB (ref 0.57–1.00)
Calcium: 9.6 mg/dL (ref 8.7–10.2)
Chloride: 100 mmol/L (ref 96–106)
GFR, EST AFRICAN AMERICAN: 128 mL/min/{1.73_m2} (ref >59)
GFR, EST NON AFRICAN AMERICAN: 111 mL/min/{1.73_m2} (ref >59)
GLOBULIN, TOTAL: 2.1 g/dL (ref 1.5–4.5)
Glucose: 109 mg/dL — ABNORMAL HIGH (ref 65–99)
Potassium: 4.1 mmol/L (ref 3.5–5.2)
SODIUM: 141 mmol/L (ref 134–144)
TOTAL PROTEIN: 6.5 g/dL (ref 6.0–8.5)

## 2017-03-16 LAB — CBC WITH DIFFERENTIAL/PLATELET
Basophils Absolute: 0 10*3/uL (ref 0.0–0.2)
Basos: 0 %
EOS (ABSOLUTE): 0.2 10*3/uL (ref 0.0–0.4)
Eos: 2 %
HEMOGLOBIN: 13 g/dL (ref 11.1–15.9)
Hematocrit: 38.7 % (ref 34.0–46.6)
IMMATURE GRANS (ABS): 0 10*3/uL (ref 0.0–0.1)
Immature Granulocytes: 0 %
LYMPHS ABS: 2.3 10*3/uL (ref 0.7–3.1)
Lymphs: 29 %
MCH: 30.3 pg (ref 26.6–33.0)
MCHC: 33.6 g/dL (ref 31.5–35.7)
MCV: 90 fL (ref 79–97)
MONOCYTES: 6 %
Monocytes Absolute: 0.5 10*3/uL (ref 0.1–0.9)
NEUTROS ABS: 4.9 10*3/uL (ref 1.4–7.0)
Neutrophils: 63 %
Platelets: 223 10*3/uL (ref 150–379)
RBC: 4.29 x10E6/uL (ref 3.77–5.28)
RDW: 13.5 % (ref 12.3–15.4)
WBC: 7.8 10*3/uL (ref 3.4–10.8)

## 2017-03-16 LAB — TSH+FREE T4
Free T4: 1.33 ng/dL (ref 0.82–1.77)
TSH: 3.35 u[IU]/mL (ref 0.450–4.500)

## 2017-03-20 ENCOUNTER — Ambulatory Visit (INDEPENDENT_AMBULATORY_CARE_PROVIDER_SITE_OTHER): Payer: BLUE CROSS/BLUE SHIELD | Admitting: Pharmacist

## 2017-03-20 ENCOUNTER — Encounter: Payer: Self-pay | Admitting: Pharmacist

## 2017-03-20 DIAGNOSIS — M810 Age-related osteoporosis without current pathological fracture: Secondary | ICD-10-CM | POA: Diagnosis not present

## 2017-03-20 NOTE — Patient Instructions (Signed)
Exercise for Strong Bones  Exercise is important to build and maintain strong bones / bone density.  There are 2 types of exercises that are important to building and maintaining strong bones:  Weight- bearing and muscle-stregthening.  Weight-bearing Exercises  These exercises include activities that make you move against gravity while staying upright. Weight-bearing exercises can be high-impact or low-impact.  High-impact weight-bearing exercises help build bones and keep them strong. If you have broken a bone due to osteoporosis or are at risk of breaking a bone, you may need to avoid high-impact exercises. If you're not sure, you should check with your healthcare provider.  Examples of high-impact weight-bearing exercises are: Dancing  Doing high-impact aerobics  Hiking  Jogging/running  Jumping Rope  Stair climbing  Tennis  Low-impact weight-bearing exercises can also help keep bones strong and are a safe alternative if you cannot do high-impact exercises.   Examples of low-impact weight-bearing exercises are: Using elliptical training machines  Doing low-impact aerobics  Using stair-step machines  Fast walking on a treadmill or outside   Muscle-Strengthening Exercises These exercises include activities where you move your body, a weight or some other resistance against gravity. They are also known as resistance exercises and include: Lifting weights  Using elastic exercise bands  Using weight machines  Lifting your own body weight  Functional movements, such as standing and rising up on your toes  Yoga and Pilates can also improve strength, balance and flexibility. However, certain positions may not be safe for people with osteoporosis or those at increased risk of broken bones. For example, exercises that have you bend forward may increase the chance of breaking a bone in the spine.   Non-Impact Exercises There are other types of exercises that can help  prevent falls.  Non-impact exercises can help you to improve balance, posture and how well you move in everyday activities. Some of these exercises include: Balance exercises that strengthen your legs and test your balance, such as Tai Chi, can decrease your risk of falls.  Posture exercises that improve your posture and reduce rounded or "sloping" shoulders can help you decrease the chance of breaking a bone, especially in the spine.  Functional exercises that improve how well you move can help you with everyday activities and decrease your chance of falling and breaking a bone. For example, if you have trouble getting up from a chair or climbing stairs, you should do these activities as exercises.   **A physical therapist can teach you balance, posture and functional exercises. He/she can also help you learn which exercises are safe and appropriate for you.  [facility] has a physical therapy office in [place] in front of our office and referrals can be made for assessments and treatment as needed and strength and balance training.  If you would like to have an assessment with Chad and our physical therapy team please let a nurse or provider know.   Fall Prevention in the Home Falls can cause injuries and can affect people from all age groups. There are many simple things that you can do to make your home safe and to help prevent falls. What can I do on the outside of my home?  Regularly repair the edges of walkways and driveways and fix any cracks.  Remove high doorway thresholds.  Trim any shrubbery on the main path into your home.  Use bright outdoor lighting.  Clear walkways of debris and clutter, including tools and rocks.  Regularly check that handrails   are securely fastened and in good repair. Both sides of any steps should have handrails.  Install guardrails along the edges of any raised decks or porches.  Have leaves, snow, and ice cleared regularly.  Use sand or salt on  walkways during winter months.  In the garage, clean up any spills right away, including grease or oil spills. What can I do in the bathroom?  Use night lights.  Install grab bars by the toilet and in the tub and shower. Do not use towel bars as grab bars.  Use non-skid mats or decals on the floor of the tub or shower.  If you need to sit down while you are in the shower, use a plastic, non-slip stool.  Keep the floor dry. Immediately clean up any water that spills on the floor.  Remove soap buildup in the tub or shower on a regular basis.  Attach bath mats securely with double-sided non-slip rug tape.  Remove throw rugs and other tripping hazards from the floor. What can I do in the bedroom?  Use night lights.  Make sure that a bedside light is easy to reach.  Do not use oversized bedding that drapes onto the floor.  Have a firm chair that has side arms to use for getting dressed.  Remove throw rugs and other tripping hazards from the floor. What can I do in the kitchen?  Clean up any spills right away.  Avoid walking on wet floors.  Place frequently used items in easy-to-reach places.  If you need to reach for something above you, use a sturdy step stool that has a grab bar.  Keep electrical cables out of the way.  Do not use floor polish or wax that makes floors slippery. If you have to use wax, make sure that it is non-skid floor wax.  Remove throw rugs and other tripping hazards from the floor. What can I do in the stairways?  Do not leave any items on the stairs.  Make sure that there are handrails on both sides of the stairs. Fix handrails that are broken or loose. Make sure that handrails are as long as the stairways.  Check any carpeting to make sure that it is firmly attached to the stairs. Fix any carpet that is loose or worn.  Avoid having throw rugs at the top or bottom of stairways, or secure the rugs with carpet tape to prevent them from  moving.  Make sure that you have a light switch at the top of the stairs and the bottom of the stairs. If you do not have them, have them installed. What are some other fall prevention tips?  Wear closed-toe shoes that fit well and support your feet. Wear shoes that have rubber soles or low heels.  When you use a stepladder, make sure that it is completely opened and that the sides are firmly locked. Have someone hold the ladder while you are using it. Do not climb a closed stepladder.  Add color or contrast paint or tape to grab bars and handrails in your home. Place contrasting color strips on the first and last steps.  Use mobility aids as needed, such as canes, walkers, scooters, and crutches.  Turn on lights if it is dark. Replace any light bulbs that burn out.  Set up furniture so that there are clear paths. Keep the furniture in the same spot.  Fix any uneven floor surfaces.  Choose a carpet design that does not hide the edge   of steps of a stairway.  Be aware of any and all pets.  Review your medicines with your healthcare provider. Some medicines can cause dizziness or changes in blood pressure, which increase your risk of falling. Talk with your health care provider about other ways that you can decrease your risk of falls. This may include working with a physical therapist or trainer to improve your strength, balance, and endurance. This information is not intended to replace advice given to you by your health care provider. Make sure you discuss any questions you have with your health care provider. Document Released: 09/07/2002 Document Revised: 02/14/2016 Document Reviewed: 10/22/2014 Elsevier Interactive Patient Education  2017 [employer].
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
## 2017-03-22 ENCOUNTER — Other Ambulatory Visit: Payer: Self-pay | Admitting: Family Medicine

## 2017-03-22 ENCOUNTER — Telehealth: Payer: Self-pay | Admitting: Family Medicine

## 2017-03-22 LAB — SPECIMEN STATUS REPORT

## 2017-03-22 LAB — VITAMIN D 25 HYDROXY (VIT D DEFICIENCY, FRACTURES): Vit D, 25-Hydroxy: 26.7 ng/mL — ABNORMAL LOW (ref 30.0–100.0)

## 2017-03-22 MED ORDER — VITAMIN D (ERGOCALCIFEROL) 1.25 MG (50000 UNIT) PO CAPS: ORAL_CAPSULE | ORAL | 0 refills | Status: DC

## 2017-03-22 NOTE — Telephone Encounter (Signed)
Pt aware of labs  

## 2017-04-05 DIAGNOSIS — J9600 Acute respiratory failure, unspecified whether with hypoxia or hypercapnia: Secondary | ICD-10-CM | POA: Diagnosis not present

## 2017-04-15 ENCOUNTER — Other Ambulatory Visit: Payer: Self-pay | Admitting: *Deleted

## 2017-04-15 MED ORDER — FUROSEMIDE 40 MG PO TABS: 40.0000 mg | ORAL_TABLET | Freq: Every day | ORAL | 1 refills | Status: DC

## 2017-05-06 DIAGNOSIS — J9600 Acute respiratory failure, unspecified whether with hypoxia or hypercapnia: Secondary | ICD-10-CM | POA: Diagnosis not present

## 2017-05-10 ENCOUNTER — Other Ambulatory Visit: Payer: Self-pay | Admitting: Family Medicine

## 2017-05-12 ENCOUNTER — Other Ambulatory Visit: Payer: Self-pay | Admitting: Family Medicine

## 2017-05-18 ENCOUNTER — Other Ambulatory Visit: Payer: Self-pay | Admitting: Family Medicine

## 2017-06-06 DIAGNOSIS — J9600 Acute respiratory failure, unspecified whether with hypoxia or hypercapnia: Secondary | ICD-10-CM | POA: Diagnosis not present

## 2017-06-24 ENCOUNTER — Other Ambulatory Visit: Payer: Self-pay | Admitting: Family Medicine

## 2017-06-25 ENCOUNTER — Other Ambulatory Visit: Payer: Self-pay | Admitting: Family Medicine

## 2017-07-06 DIAGNOSIS — J9600 Acute respiratory failure, unspecified whether with hypoxia or hypercapnia: Secondary | ICD-10-CM | POA: Diagnosis not present

## 2017-07-12 DIAGNOSIS — Z1231 Encounter for screening mammogram for malignant neoplasm of breast: Secondary | ICD-10-CM | POA: Diagnosis not present

## 2017-07-22 ENCOUNTER — Other Ambulatory Visit: Payer: Self-pay | Admitting: *Deleted

## 2017-07-22 MED ORDER — FUROSEMIDE 40 MG PO TABS: 40.0000 mg | ORAL_TABLET | Freq: Every day | ORAL | 0 refills | Status: DC

## 2017-07-22 MED ORDER — ALENDRONATE SODIUM 70 MG PO TABS: 70.0000 mg | ORAL_TABLET | ORAL | 3 refills | Status: DC

## 2017-07-26 DIAGNOSIS — R928 Other abnormal and inconclusive findings on diagnostic imaging of breast: Secondary | ICD-10-CM | POA: Diagnosis not present

## 2017-07-26 DIAGNOSIS — N6321 Unspecified lump in the left breast, upper outer quadrant: Secondary | ICD-10-CM | POA: Diagnosis not present

## 2017-08-06 DIAGNOSIS — J9600 Acute respiratory failure, unspecified whether with hypoxia or hypercapnia: Secondary | ICD-10-CM | POA: Diagnosis not present

## 2017-08-08 ENCOUNTER — Other Ambulatory Visit: Payer: Self-pay | Admitting: Family Medicine

## 2017-09-05 DIAGNOSIS — J9600 Acute respiratory failure, unspecified whether with hypoxia or hypercapnia: Secondary | ICD-10-CM | POA: Diagnosis not present

## 2017-09-14 ENCOUNTER — Other Ambulatory Visit: Payer: Self-pay | Admitting: Family Medicine

## 2017-09-16 ENCOUNTER — Encounter: Payer: Self-pay | Admitting: Family Medicine

## 2017-09-16 ENCOUNTER — Ambulatory Visit: Payer: BLUE CROSS/BLUE SHIELD | Admitting: Family Medicine

## 2017-09-16 VITALS — BP 92/59 | HR 46 | Temp 98.7°F | Ht 64.0 in | Wt 234.0 lb

## 2017-09-16 DIAGNOSIS — D509 Iron deficiency anemia, unspecified: Secondary | ICD-10-CM | POA: Diagnosis not present

## 2017-09-16 DIAGNOSIS — E782 Mixed hyperlipidemia: Secondary | ICD-10-CM | POA: Diagnosis not present

## 2017-09-16 DIAGNOSIS — E038 Other specified hypothyroidism: Secondary | ICD-10-CM | POA: Diagnosis not present

## 2017-09-16 DIAGNOSIS — J9612 Chronic respiratory failure with hypercapnia: Secondary | ICD-10-CM | POA: Diagnosis not present

## 2017-09-16 DIAGNOSIS — M810 Age-related osteoporosis without current pathological fracture: Secondary | ICD-10-CM

## 2017-09-16 MED ORDER — FUROSEMIDE 40 MG PO TABS: 40.0000 mg | ORAL_TABLET | Freq: Every day | ORAL | 0 refills | Status: DC

## 2017-09-16 MED ORDER — FERROUS FUMARATE 324 (106 FE) MG PO TABS: 1.0000 | ORAL_TABLET | Freq: Two times a day (BID) | ORAL | 1 refills | Status: DC

## 2017-09-16 MED ORDER — ATORVASTATIN CALCIUM 40 MG PO TABS: 40.0000 mg | ORAL_TABLET | Freq: Every day | ORAL | 1 refills | Status: DC

## 2017-09-16 MED ORDER — VITAMIN D (ERGOCALCIFEROL) 1.25 MG (50000 UNIT) PO CAPS: ORAL_CAPSULE | ORAL | 1 refills | Status: DC

## 2017-09-16 MED ORDER — POTASSIUM CHLORIDE ER 10 MEQ PO CPCR: ORAL_CAPSULE | ORAL | 1 refills | Status: DC

## 2017-09-16 NOTE — Progress Notes (Signed)
Subjective:  Patient ID: Makayla Gibson, female    DOB: [birth year]  Age: 56 y.o. MRN: [id number]  CC: Hypothyroidism (pt here today for routine follow up of her chronic medical conditions)   HPI Makayla Gibson presents for patient presents for follow-up on  thyroid. The patient has a history of hypothyroidism for many years. It has been stable recently. Pt. denies any change in  voice, loss of hair, heat or cold intolerance. Energy level has been adequate to good. Patient denies constipation and diarrhea. No myxedema. Medication is as noted below. Verified that pt is taking it daily on an empty stomach. Well tolerated.  She has been under treatment for iron deficiency anemia.  Taking her supplements regularly.  Has noted no signs of bleeding.  Her stools are dark but she feels that is related to the use of the iron.  Patient in for follow-up of elevated cholesterol. Doing well without complaints on current medication. Denies side effects of statin including myalgia and arthralgia and nausea. Also in today for liver function testing. Currently no chest pain, shortness of breath or other cardiovascular related symptoms noted.  She denies any excessive shortness of breath lately.  She remains at baseline.  Depression screen [facility] 2/9 09/16/2017 03/15/2017 11/22/2016  Decreased Interest 0 0 0  Down, Depressed, Hopeless 0 0 0  PHQ - 2 Score 0 0 0    History Makayla Gibson has a past medical history of Abscess of breast ([id number]), Anemia, Arthritis, Heart murmur, Hyperthyroidism, Osteoporosis, Sleep apnea, and Thyroid disease (11/2014).   She has a past surgical history that includes Cesarean section; Appendectomy; Incision and drainage abscess (Right, 07/29/2015); Breast cyst excision (Right, 09/06/2016); and Mastectomy, partial (Right, 09/06/2016).   Her family history includes Alcohol abuse in her father; Cancer in her brother; Heart disease in her brother and mother; Osteoporosis in her  brother and mother; Rheumatic fever in her mother.She reports that she quit smoking about 8 years ago. she has never used smokeless tobacco. She reports that she does not drink alcohol or use drugs.    ROS Review of Systems  Objective:  BP (!) 92/59   Pulse (!) 46   Temp 98.7 F (37.1 C) (Oral)   Ht _0  (1.626 m)   Wt 234 lb (106.1 kg)   BMI 40.17 kg/m   BP Readings from Last 3 Encounters:  09/16/17 (!) 92/59  03/15/17 (!) 101/56  11/22/16 (!) 86/57    Wt Readings from Last 3 Encounters:  09/16/17 234 lb (106.1 kg)  03/15/17 222 lb (100.7 kg)  11/22/16 215 lb (97.5 kg)     Physical Exam    Assessment & Plan:   Isabell was seen today for hypothyroidism.  Diagnoses and all orders for this visit:  Other specified hypothyroidism -     CBC with Differential/Platelet -     CMP14+EGFR -     TSH -     T4, Free  Iron deficiency anemia, unspecified iron deficiency anemia type  Mixed hyperlipidemia -     Lipid panel  Age-related osteoporosis without current pathological fracture -     VITAMIN D 25 Hydroxy (Vit-D Deficiency, Fractures)  Chronic hypercapnic respiratory failure (HCC)  Other orders -     atorvastatin (LIPITOR) 40 MG tablet; Take 1 tablet (40 mg total) by mouth daily. Take for cholesterol control -     Ferrous Fumarate (FERROCITE) 324 (106 Fe) MG TABS tablet; Take 1 tablet (106 mg of iron total) by mouth  2 (two) times daily. -     furosemide (LASIX) 40 MG tablet; Take 1 tablet (40 mg total) by mouth daily. -     potassium chloride (MICRO-K) 10 MEQ CR capsule; TAKE 1 CAPSULE BY MOUTH EVERY DAY -     Vitamin D, Ergocalciferol, (DRISDOL) 50000 units CAPS capsule; TAKE ONE CAPSULE TWICE WEEKLY FOR 8 WEEKS       I have changed [name]. Rhody's FERROCITE to Ferrous Fumarate. I am also having her maintain her multivitamin with minerals, Naphazoline-Glycerin, acetaminophen, polyethylene glycol, levothyroxine, alendronate, atorvastatin, furosemide,  potassium chloride, and Vitamin D (Ergocalciferol).  Allergies as of 09/16/2017      Reactions   Keflex [cephalexin] Itching, Rash   Percocet [oxycodone-acetaminophen] Itching, Rash   Percodan [oxycodone-aspirin] Itching, Rash      Medication List        Accurate as of 09/16/17 11:59 PM. Always use your most recent med list.          acetaminophen 500 MG tablet Commonly known as:  TYLENOL Take 500 mg by mouth every 6 (six) hours as needed (for pain.).   alendronate 70 MG tablet Commonly known as:  FOSAMAX Take 1 tablet (70 mg total) by mouth every 7 (seven) days. Take with a full glass of water on an empty stomach.   atorvastatin 40 MG tablet Commonly known as:  LIPITOR Take 1 tablet (40 mg total) by mouth daily. Take for cholesterol control   CLEAR EYES MAX REDNESS RELIEF 0.03-0.5 % Soln Generic drug:  Naphazoline-Glycerin Place 2 drops into both eyes 3 (three) times daily as needed (for redness/irritation.).   Ferrous Fumarate 324 (106 Fe) MG Tabs tablet Commonly known as:  FERROCITE Take 1 tablet (106 mg of iron total) by mouth 2 (two) times daily.   furosemide 40 MG tablet Commonly known as:  LASIX Take 1 tablet (40 mg total) by mouth daily.   levothyroxine 100 MCG tablet Commonly known as:  SYNTHROID, LEVOTHROID TAKE 1 TABLET BY MOUTH EVERY DAY   multivitamin with minerals Tabs tablet Take 1 tablet by mouth daily.   polyethylene glycol packet Commonly known as:  MIRALAX / GLYCOLAX Take 17 g by mouth daily as needed for mild constipation.   potassium chloride 10 MEQ CR capsule Commonly known as:  MICRO-K TAKE 1 CAPSULE BY MOUTH EVERY DAY   Vitamin D (Ergocalciferol) 50000 units Caps capsule Commonly known as:  DRISDOL TAKE ONE CAPSULE TWICE WEEKLY FOR 8 WEEKS        Follow-up: No Follow-up on file.  Claretta Fraise, M.D.

## 2017-09-17 ENCOUNTER — Encounter: Payer: Self-pay | Admitting: Family Medicine

## 2017-09-17 LAB — CMP14+EGFR
A/G RATIO: 1.8 (ref 1.2–2.2)
ALBUMIN: 4.2 g/dL (ref 3.5–5.5)
ALT: 22 IU/L (ref 0–32)
AST: 14 IU/L (ref 0–40)
Alkaline Phosphatase: 59 IU/L (ref 39–117)
BILIRUBIN TOTAL: 0.4 mg/dL (ref 0.0–1.2)
BUN / CREAT RATIO: 48 — AB (ref 9–23)
BUN: 22 mg/dL (ref 6–24)
CHLORIDE: 104 mmol/L (ref 96–106)
CO2: 27 mmol/L (ref 20–29)
Calcium: 9.6 mg/dL (ref 8.7–10.2)
Creatinine, Ser: 0.46 mg/dL — ABNORMAL LOW (ref 0.57–1.00)
GFR calc non Af Amer: 112 mL/min/{1.73_m2} (ref >59)
GFR, EST AFRICAN AMERICAN: 130 mL/min/{1.73_m2} (ref >59)
Globulin, Total: 2.4 g/dL (ref 1.5–4.5)
Glucose: 106 mg/dL — ABNORMAL HIGH (ref 65–99)
POTASSIUM: 4.1 mmol/L (ref 3.5–5.2)
Sodium: 144 mmol/L (ref 134–144)
TOTAL PROTEIN: 6.6 g/dL (ref 6.0–8.5)

## 2017-09-17 LAB — CBC WITH DIFFERENTIAL/PLATELET
BASOS: 0 %
Basophils Absolute: 0 10*3/uL (ref 0.0–0.2)
EOS (ABSOLUTE): 0.1 10*3/uL (ref 0.0–0.4)
Eos: 2 %
Hematocrit: 39.3 % (ref 34.0–46.6)
Hemoglobin: 13 g/dL (ref 11.1–15.9)
IMMATURE GRANS (ABS): 0 10*3/uL (ref 0.0–0.1)
Immature Granulocytes: 0 %
LYMPHS: 22 %
Lymphocytes Absolute: 1.9 10*3/uL (ref 0.7–3.1)
MCH: 30.5 pg (ref 26.6–33.0)
MCHC: 33.1 g/dL (ref 31.5–35.7)
MCV: 92 fL (ref 79–97)
Monocytes Absolute: 0.6 10*3/uL (ref 0.1–0.9)
Monocytes: 7 %
NEUTROS ABS: 6 10*3/uL (ref 1.4–7.0)
Neutrophils: 69 %
PLATELETS: 241 10*3/uL (ref 150–379)
RBC: 4.26 x10E6/uL (ref 3.77–5.28)
RDW: 13.9 % (ref 12.3–15.4)
WBC: 8.7 10*3/uL (ref 3.4–10.8)

## 2017-09-17 LAB — LIPID PANEL
Chol/HDL Ratio: 3.9 ratio (ref 0.0–4.4)
Cholesterol, Total: 149 mg/dL (ref 100–199)
HDL: 38 mg/dL — ABNORMAL LOW (ref >39)
LDL Calculated: 80 mg/dL (ref 0–99)
Triglycerides: 157 mg/dL — ABNORMAL HIGH (ref 0–149)
VLDL Cholesterol Cal: 31 mg/dL (ref 5–40)

## 2017-09-17 LAB — TSH: TSH: 5.36 u[IU]/mL — AB (ref 0.450–4.500)

## 2017-09-17 LAB — VITAMIN D 25 HYDROXY (VIT D DEFICIENCY, FRACTURES): VIT D 25 HYDROXY: 51.9 ng/mL (ref 30.0–100.0)

## 2017-09-17 LAB — T4, FREE: FREE T4: 1.24 ng/dL (ref 0.82–1.77)

## 2017-10-06 DIAGNOSIS — J9600 Acute respiratory failure, unspecified whether with hypoxia or hypercapnia: Secondary | ICD-10-CM | POA: Diagnosis not present

## 2017-10-11 ENCOUNTER — Telehealth: Payer: Self-pay | Admitting: Family Medicine

## 2017-10-28 DIAGNOSIS — R928 Other abnormal and inconclusive findings on diagnostic imaging of breast: Secondary | ICD-10-CM | POA: Diagnosis not present

## 2017-10-28 DIAGNOSIS — Z9012 Acquired absence of left breast and nipple: Secondary | ICD-10-CM | POA: Diagnosis not present

## 2017-11-01 ENCOUNTER — Telehealth: Payer: Self-pay | Admitting: Family Medicine

## 2017-11-01 NOTE — Telephone Encounter (Signed)
[facility] to x-ray  Pt scheduled with Dr. Marlou Starks 11/26/17 at 11:30 with an arrival time of 11am. Pt placed on the cancellation list

## 2017-11-01 NOTE — Telephone Encounter (Signed)
Pt aware that I will forward message to carlon to check on a referral for Surgery

## 2017-11-01 NOTE — Telephone Encounter (Signed)
Pt aware of appointment date/time 

## 2017-11-06 DIAGNOSIS — J9600 Acute respiratory failure, unspecified whether with hypoxia or hypercapnia: Secondary | ICD-10-CM | POA: Diagnosis not present

## 2017-11-26 DIAGNOSIS — N6320 Unspecified lump in the left breast, unspecified quadrant: Secondary | ICD-10-CM | POA: Diagnosis not present

## 2017-12-04 DIAGNOSIS — J9600 Acute respiratory failure, unspecified whether with hypoxia or hypercapnia: Secondary | ICD-10-CM | POA: Diagnosis not present

## 2017-12-17 DIAGNOSIS — N6489 Other specified disorders of breast: Secondary | ICD-10-CM | POA: Diagnosis not present

## 2017-12-17 DIAGNOSIS — N63 Unspecified lump in unspecified breast: Secondary | ICD-10-CM | POA: Diagnosis not present

## 2017-12-30 ENCOUNTER — Ambulatory Visit: Payer: Self-pay | Admitting: General Surgery

## 2018-01-04 DIAGNOSIS — J9600 Acute respiratory failure, unspecified whether with hypoxia or hypercapnia: Secondary | ICD-10-CM | POA: Diagnosis not present

## 2018-01-18 ENCOUNTER — Other Ambulatory Visit: Payer: Self-pay | Admitting: Family Medicine

## 2018-01-22 ENCOUNTER — Other Ambulatory Visit (HOSPITAL_COMMUNITY): Payer: BLUE CROSS/BLUE SHIELD

## 2018-01-23 NOTE — Pre-Procedure Instructions (Signed)
SALOME HAUTALA  01/23/2018      [id number] - [place], [place] - [place] [address] Phone: [phone] Fax: [phone]    Your procedure is scheduled on May 1  Report to [facility] at 0930 A.M.  Call this number if you have problems the morning of surgery:  [phone]   Remember:  Do not eat food or drink liquids after midnight.  Please complete your PRE-SURGERY ENSURE that was given to before you leave your house the morning of surgery.  Please, if able, drink it in one setting. DO NOT SIP.  Continue all medications as directed by your physician except follow these medication instructions before surgery below   Take these medicines the morning of surgery with A SIP OF WATER  acetaminophen (TYLENOL)  levothyroxine (SYNTHROID, LEVOTHROID) Eye drops if needd  7 days prior to surgery STOP taking any Aspirin(unless otherwise instructed by your surgeon), Aleve, Naproxen, Ibuprofen, Motrin, Advil, Goody's, BC's, all herbal medications, fish oil, and all vitamins    Do not wear jewelry, make-up or nail polish.  Do not wear lotions, powders, or perfumes, or deodorant.  Do not shave 48 hours prior to surgery.    Do not bring valuables to the hospital.  [facility] is not responsible for any belongings or valuables.  Contacts, dentures or bridgework may not be worn into surgery.  Leave your suitcase in the car.  After surgery it may be brought to your room.  For patients admitted to the hospital, discharge time will be determined by your treatment team.  Patients discharged the day of surgery will not be allowed to drive home.    Special instructions:   [place]- Preparing For Surgery  Before surgery, you can play an important role. Because skin is not sterile, your skin needs to be as free of germs as possible. You can reduce the number of germs on your skin by washing with CHG  (chlorahexidine gluconate) Soap before surgery.  CHG is an antiseptic cleaner which kills germs and bonds with the skin to continue killing germs even after washing.  Please do not use if you have an allergy to CHG or antibacterial soaps. If your skin becomes reddened/irritated stop using the CHG.  Do not shave (including legs and underarms) for at least 48 hours prior to first CHG shower. It is OK to shave your face.  Please follow these instructions carefully.   1. Shower the NIGHT BEFORE SURGERY and the MORNING OF SURGERY with CHG.   2. If you chose to wash your hair, wash your hair first as usual with your normal shampoo.  3. After you shampoo, rinse your hair and body thoroughly to remove the shampoo.  4. Use CHG as you would any other liquid soap. You can apply CHG directly to the skin and wash gently with a scrungie or a clean washcloth.   5. Apply the CHG Soap to your body ONLY FROM THE NECK DOWN.  Do not use on open wounds or open sores. Avoid contact with your eyes, ears, mouth and genitals (private parts). Wash Face and genitals (private parts)  with your normal soap.  6. Wash thoroughly, paying special attention to the area where your surgery will be performed.  7. Thoroughly rinse your body with warm water from the neck down.  8. DO NOT shower/wash with your normal soap after using and rinsing off the CHG Soap.  9.  Pat yourself dry with a CLEAN TOWEL.  10. Wear CLEAN PAJAMAS to bed the night before surgery, wear comfortable clothes the morning of surgery  11. Place CLEAN SHEETS on your bed the night of your first shower and DO NOT SLEEP WITH PETS.    Day of Surgery: Do not apply any deodorants/lotions. Please wear clean clothes to the hospital/surgery center.      Please read over the following fact sheets that you were given.

## 2018-01-24 ENCOUNTER — Other Ambulatory Visit: Payer: Self-pay

## 2018-01-24 ENCOUNTER — Encounter (HOSPITAL_COMMUNITY)
Admission: RE | Admit: 2018-01-24 | Discharge: 2018-01-24 | Disposition: A | Discharge status: Home / Self Care | Payer: BLUE CROSS/BLUE SHIELD | Source: Ambulatory Visit | Attending: General Surgery | Admitting: General Surgery

## 2018-01-24 ENCOUNTER — Encounter (HOSPITAL_COMMUNITY): Payer: Self-pay

## 2018-01-24 DIAGNOSIS — Z01818 Encounter for other preprocedural examination: Secondary | ICD-10-CM | POA: Insufficient documentation

## 2018-01-24 DIAGNOSIS — Z01812 Encounter for preprocedural laboratory examination: Secondary | ICD-10-CM | POA: Diagnosis not present

## 2018-01-24 DIAGNOSIS — Z7983 Long term (current) use of bisphosphonates: Secondary | ICD-10-CM | POA: Diagnosis not present

## 2018-01-24 DIAGNOSIS — N6002 Solitary cyst of left breast: Secondary | ICD-10-CM | POA: Diagnosis not present

## 2018-01-24 DIAGNOSIS — E079 Disorder of thyroid, unspecified: Secondary | ICD-10-CM | POA: Insufficient documentation

## 2018-01-24 DIAGNOSIS — M810 Age-related osteoporosis without current pathological fracture: Secondary | ICD-10-CM | POA: Diagnosis not present

## 2018-01-24 DIAGNOSIS — Z79890 Hormone replacement therapy (postmenopausal): Secondary | ICD-10-CM | POA: Insufficient documentation

## 2018-01-24 DIAGNOSIS — G4730 Sleep apnea, unspecified: Secondary | ICD-10-CM | POA: Diagnosis not present

## 2018-01-24 DIAGNOSIS — D649 Anemia, unspecified: Secondary | ICD-10-CM | POA: Insufficient documentation

## 2018-01-24 DIAGNOSIS — M1990 Unspecified osteoarthritis, unspecified site: Secondary | ICD-10-CM | POA: Insufficient documentation

## 2018-01-24 DIAGNOSIS — Z79899 Other long term (current) drug therapy: Secondary | ICD-10-CM | POA: Insufficient documentation

## 2018-01-24 LAB — BASIC METABOLIC PANEL
Anion gap: 11 (ref 5–15)
BUN: 26 mg/dL — AB (ref 6–20)
CALCIUM: 9.9 mg/dL (ref 8.9–10.3)
CO2: 25 mmol/L (ref 22–32)
CREATININE: 0.64 mg/dL (ref 0.44–1.00)
Chloride: 103 mmol/L (ref 101–111)
GFR calc Af Amer: >60 mL/min (ref >60)
GLUCOSE: 103 mg/dL — AB (ref 65–99)
POTASSIUM: 4.3 mmol/L (ref 3.5–5.1)
SODIUM: 139 mmol/L (ref 135–145)

## 2018-01-24 LAB — CBC
HCT: 38.8 % (ref 36.0–46.0)
Hemoglobin: 12.8 g/dL (ref 12.0–15.0)
MCH: 30.8 pg (ref 26.0–34.0)
MCHC: 33 g/dL (ref 30.0–36.0)
MCV: 93.5 fL (ref 78.0–100.0)
Platelets: 233 10*3/uL (ref 150–400)
RBC: 4.15 MIL/uL (ref 3.87–5.11)
RDW: 13.2 % (ref 11.5–15.5)
WBC: 10.4 10*3/uL (ref 4.0–10.5)

## 2018-01-24 NOTE — Progress Notes (Addendum)
PCP - Claretta Fraise Cardiologist - denies  Chest x-ray - not needed EKG - 01/24/17 Stress Test - 2016 ECHO - 2016 Cardiac Cath - denies   Anesthesia review: yes  Patient denies shortness of breath, fever, cough and chest pain at PAT appointment   Patient verbalized understanding of instructions that were given to them at the PAT appointment. Patient was also instructed that they will need to review over the PAT instructions again at home before surgery.

## 2018-01-27 NOTE — Progress Notes (Signed)
Anesthesia Chart Review:   Case:  [id number] Date/Time:  01/29/18 1115   Procedures:      LEFT BREAST LUMPECTOMY (Left Breast)     DEBRIDEMENT AND CLOSURE WOUND (Left )     MAMMARY REDUCTION  (BREAST) FOR SYMETRY (Left )   Anesthesia type:  General   Pre-op diagnosis:  LARGE LEFT BREAST CYST   Location:  [facility] ROOM 02 / [facility]   Surgeon:  Jovita Kussmaul, MD; Wallace Going, DO      DISCUSSION: Pt is a 57 year old female.    VS: BP 125/83   Pulse (!) 54   Temp 37 C   Resp 20   Ht 5\' 4"  (1.626 m)   Wt 229 lb 4.5 oz (104 kg)   SpO2 96%   BMI 39.36 kg/m   PROVIDERS: Claretta Fraise, MD   LABS: Labs reviewed: Acceptable for surgery. (all labs ordered are listed, but only abnormal results are displayed)  Labs Reviewed  BASIC METABOLIC PANEL - Abnormal; Notable for the following components:      Result Value   Glucose, Bld 103 (*)    BUN 26 (*)    All other components within normal limits  CBC    EKG 01/24/18: Sinus bradycardia (52 bpm). RBBB   CV:  Echo 12/27/14 (care everywhere):  - LA is mildly to moderately dilated. - Mild concentric LVH. - Regional wall motion abnormalities cannot be excluded due to limited visualization.  - LV ejection fraction = 60-65%. - RV normal size. RV function cannot be assessed due to poor image quality. - RA is mildly dilated. - Injection of agitated saline showed no right-to-left shunt. - Aortic valve sclerosis. Trace aortic regurgitation. - Mild mitral annular calcification. Focal thickening of the mitral leaflet with preserved opening. Rrace mitral regurgitation. - Trace tricuspid regurgitation. - Pulmonic valve is not well visualized. - No pericardial effusion. - No comparison study available.   I spoke with pt by telephone; she has never had a stress test.    Past Medical History:  Diagnosis Date  . Abscess of breast [id number]  . Anemia   . Arthritis   . Heart murmur    as a child  . Hyperthyroidism   . Osteoporosis    . Sleep apnea   . Thyroid disease 11/2014    Past Surgical History:  Procedure Laterality Date  . APPENDECTOMY    . BREAST CYST EXCISION Right 09/06/2016  . CESAREAN SECTION     4  . INCISION AND DRAINAGE ABSCESS Right 07/29/2015   Procedure: INCISION AND DRAINAGE BREAST ABSCESS;  Surgeon: Aviva Signs, MD;  Location: [facility];  Service: General;  Laterality: Right;  . MASTECTOMY, PARTIAL Right 09/06/2016   Procedure: EXCISION OF LARGE RIGHT BREAST CHRONIC ABSCESS CAVITY;  Surgeon: Autumn Messing III, MD;  Location: [facility];  Service: General;  Laterality: Right;    MEDICATIONS: . acetaminophen (TYLENOL) 500 MG tablet  . acetaminophen (TYLENOL) 650 MG CR tablet  . alendronate (FOSAMAX) 70 MG tablet  . atorvastatin (LIPITOR) 40 MG tablet  . Ferrous Fumarate (FERROCITE) 324 (106 Fe) MG TABS tablet  . furosemide (LASIX) 40 MG tablet  . levothyroxine (SYNTHROID, LEVOTHROID) 100 MCG tablet  . Multiple Vitamin (MULTIVITAMIN WITH MINERALS) TABS tablet  . Naphazoline-Glycerin (CLEAR EYES MAX REDNESS RELIEF) 0.03-0.5 % SOLN  . polyethylene glycol (MIRALAX / GLYCOLAX) packet  . potassium chloride (MICRO-K) 10 MEQ CR capsule  . Vitamin D, Ergocalciferol, (DRISDOL) 50000 units CAPS capsule  No current facility-administered medications for this encounter.     If no changes, I anticipate pt can proceed with surgery as scheduled.   Willeen Cass, FNP-BC [facility]/Anesthesiology Phone: [phone] 01/27/2018 12:15 PM

## 2018-01-28 ENCOUNTER — Ambulatory Visit: Payer: Self-pay | Admitting: Plastic Surgery

## 2018-01-28 MED ORDER — VANCOMYCIN HCL 10 G IV SOLR
1500.0000 mg | INTRAVENOUS | Status: AC
  Administered 2018-01-29: 1500 mg via INTRAVENOUS
  Filled 2018-01-28: qty 1500

## 2018-01-29 ENCOUNTER — Ambulatory Visit (HOSPITAL_COMMUNITY): Payer: BLUE CROSS/BLUE SHIELD | Admitting: Emergency Medicine

## 2018-01-29 ENCOUNTER — Encounter (HOSPITAL_COMMUNITY)
Admission: RE | Disposition: A | Discharge status: Home / Self Care | Payer: Self-pay | Source: Ambulatory Visit | Attending: Plastic Surgery

## 2018-01-29 ENCOUNTER — Observation Stay (HOSPITAL_COMMUNITY)
Admission: RE | Admit: 2018-01-29 | Discharge: 2018-01-30 | Disposition: A | Discharge status: Home / Self Care | Payer: BLUE CROSS/BLUE SHIELD | Source: Ambulatory Visit | Attending: Plastic Surgery | Admitting: Plastic Surgery

## 2018-01-29 ENCOUNTER — Ambulatory Visit (HOSPITAL_COMMUNITY): Payer: BLUE CROSS/BLUE SHIELD | Admitting: Anesthesiology

## 2018-01-29 ENCOUNTER — Encounter (HOSPITAL_COMMUNITY): Payer: Self-pay | Admitting: Plastic Surgery

## 2018-01-29 ENCOUNTER — Other Ambulatory Visit: Payer: Self-pay

## 2018-01-29 DIAGNOSIS — Z87891 Personal history of nicotine dependence: Secondary | ICD-10-CM | POA: Diagnosis not present

## 2018-01-29 DIAGNOSIS — M549 Dorsalgia, unspecified: Secondary | ICD-10-CM | POA: Insufficient documentation

## 2018-01-29 DIAGNOSIS — Z6839 Body mass index (BMI) 39.0-39.9, adult: Secondary | ICD-10-CM | POA: Insufficient documentation

## 2018-01-29 DIAGNOSIS — M542 Cervicalgia: Secondary | ICD-10-CM | POA: Insufficient documentation

## 2018-01-29 DIAGNOSIS — E785 Hyperlipidemia, unspecified: Secondary | ICD-10-CM | POA: Diagnosis not present

## 2018-01-29 DIAGNOSIS — N651 Disproportion of reconstructed breast: Secondary | ICD-10-CM | POA: Diagnosis not present

## 2018-01-29 DIAGNOSIS — E039 Hypothyroidism, unspecified: Secondary | ICD-10-CM | POA: Insufficient documentation

## 2018-01-29 DIAGNOSIS — N6489 Other specified disorders of breast: Secondary | ICD-10-CM | POA: Insufficient documentation

## 2018-01-29 DIAGNOSIS — N6002 Solitary cyst of left breast: Principal | ICD-10-CM | POA: Insufficient documentation

## 2018-01-29 DIAGNOSIS — Z79899 Other long term (current) drug therapy: Secondary | ICD-10-CM | POA: Diagnosis not present

## 2018-01-29 DIAGNOSIS — G4730 Sleep apnea, unspecified: Secondary | ICD-10-CM | POA: Diagnosis not present

## 2018-01-29 DIAGNOSIS — N62 Hypertrophy of breast: Secondary | ICD-10-CM | POA: Diagnosis not present

## 2018-01-29 DIAGNOSIS — E669 Obesity, unspecified: Secondary | ICD-10-CM | POA: Diagnosis not present

## 2018-01-29 DIAGNOSIS — N63 Unspecified lump in unspecified breast: Secondary | ICD-10-CM | POA: Diagnosis present

## 2018-01-29 DIAGNOSIS — N641 Fat necrosis of breast: Secondary | ICD-10-CM | POA: Diagnosis not present

## 2018-01-29 DIAGNOSIS — N61 Mastitis without abscess: Secondary | ICD-10-CM | POA: Diagnosis not present

## 2018-01-29 DIAGNOSIS — N611 Abscess of the breast and nipple: Secondary | ICD-10-CM | POA: Diagnosis not present

## 2018-01-29 DIAGNOSIS — D509 Iron deficiency anemia, unspecified: Secondary | ICD-10-CM | POA: Diagnosis not present

## 2018-01-29 HISTORY — PX: BREAST REDUCTION SURGERY: SHX8

## 2018-01-29 HISTORY — PX: DEBRIDEMENT AND CLOSURE WOUND: SHX5614

## 2018-01-29 HISTORY — PX: BREAST LUMPECTOMY: SHX2

## 2018-01-29 SURGERY — BREAST LUMPECTOMY
Anesthesia: General | Site: Breast | Laterality: Left

## 2018-01-29 MED ORDER — LACTATED RINGERS IV SOLN: INTRAVENOUS | Status: DC

## 2018-01-29 MED ORDER — MIDAZOLAM HCL 2 MG/2ML IJ SOLN
INTRAMUSCULAR | Status: AC
  Filled 2018-01-29: qty 2

## 2018-01-29 MED ORDER — SUGAMMADEX SODIUM 200 MG/2ML IV SOLN
INTRAVENOUS | Status: DC | PRN
  Administered 2018-01-29: 200 mg via INTRAVENOUS

## 2018-01-29 MED ORDER — CIPROFLOXACIN IN D5W 400 MG/200ML IV SOLN
400.0000 mg | Freq: Two times a day (BID) | INTRAVENOUS | Status: DC
  Administered 2018-01-29 – 2018-01-30 (×2): 400 mg via INTRAVENOUS
  Filled 2018-01-29 (×3): qty 200

## 2018-01-29 MED ORDER — MEPERIDINE HCL 50 MG/ML IJ SOLN: 6.2500 mg | INTRAMUSCULAR | Status: DC | PRN

## 2018-01-29 MED ORDER — 0.9 % SODIUM CHLORIDE (POUR BTL) OPTIME
TOPICAL | Status: DC | PRN
  Administered 2018-01-29: 1000 mL

## 2018-01-29 MED ORDER — SCOPOLAMINE 1 MG/3DAYS TD PT72
MEDICATED_PATCH | TRANSDERMAL | Status: DC | PRN
  Administered 2018-01-29: 1 via TRANSDERMAL

## 2018-01-29 MED ORDER — POLYMYXIN B SULFATE 500000 UNITS IJ SOLR
INTRAMUSCULAR | Status: AC
  Filled 2018-01-29: qty 500000

## 2018-01-29 MED ORDER — HYDROMORPHONE HCL 2 MG/ML IJ SOLN
0.2500 mg | INTRAMUSCULAR | Status: DC | PRN
  Administered 2018-01-29: 0.5 mg via INTRAVENOUS

## 2018-01-29 MED ORDER — HYDROCODONE-ACETAMINOPHEN 5-325 MG PO TABS
1.0000 | ORAL_TABLET | ORAL | Status: DC | PRN
  Administered 2018-01-29 – 2018-01-30 (×4): 1 via ORAL
  Filled 2018-01-29 (×3): qty 1

## 2018-01-29 MED ORDER — EPHEDRINE 5 MG/ML INJ
INTRAVENOUS | Status: AC
  Filled 2018-01-29: qty 20

## 2018-01-29 MED ORDER — DEXAMETHASONE SODIUM PHOSPHATE 10 MG/ML IJ SOLN
INTRAMUSCULAR | Status: AC
  Filled 2018-01-29: qty 1

## 2018-01-29 MED ORDER — SUGAMMADEX SODIUM 200 MG/2ML IV SOLN
INTRAVENOUS | Status: AC
  Filled 2018-01-29: qty 2

## 2018-01-29 MED ORDER — BUPIVACAINE-EPINEPHRINE (PF) 0.25% -1:200000 IJ SOLN
INTRAMUSCULAR | Status: AC
  Filled 2018-01-29: qty 30

## 2018-01-29 MED ORDER — ROCURONIUM BROMIDE 10 MG/ML (PF) SYRINGE
PREFILLED_SYRINGE | INTRAVENOUS | Status: DC | PRN
  Administered 2018-01-29: 50 mg via INTRAVENOUS

## 2018-01-29 MED ORDER — ONDANSETRON HCL 4 MG/2ML IJ SOLN: 4.0000 mg | Freq: Four times a day (QID) | INTRAMUSCULAR | Status: DC | PRN

## 2018-01-29 MED ORDER — LACTATED RINGERS IV SOLN
INTRAVENOUS | Status: DC
  Administered 2018-01-29 (×2): via INTRAVENOUS

## 2018-01-29 MED ORDER — ONDANSETRON HCL 4 MG/2ML IJ SOLN
INTRAMUSCULAR | Status: AC
Start: 2018-01-29 — End: 2018-01-29
  Filled 2018-01-29: qty 2

## 2018-01-29 MED ORDER — ONDANSETRON 4 MG PO TBDP
4.0000 mg | ORAL_TABLET | Freq: Four times a day (QID) | ORAL | Status: DC | PRN
Start: 2018-01-29 — End: 2018-01-30

## 2018-01-29 MED ORDER — ONDANSETRON HCL 4 MG/2ML IJ SOLN
INTRAMUSCULAR | Status: AC
  Filled 2018-01-29: qty 2

## 2018-01-29 MED ORDER — DIPHENHYDRAMINE HCL 12.5 MG/5ML PO ELIX: 12.5000 mg | ORAL_SOLUTION | Freq: Four times a day (QID) | ORAL | Status: DC | PRN

## 2018-01-29 MED ORDER — FENTANYL CITRATE (PF) 250 MCG/5ML IJ SOLN
INTRAMUSCULAR | Status: AC
Start: 2018-01-29 — End: 2018-01-29
  Filled 2018-01-29: qty 5

## 2018-01-29 MED ORDER — ZOLPIDEM TARTRATE 5 MG PO TABS
5.0000 mg | ORAL_TABLET | Freq: Every evening | ORAL | Status: DC | PRN
  Administered 2018-01-29: 5 mg via ORAL
  Filled 2018-01-29: qty 1

## 2018-01-29 MED ORDER — DIPHENHYDRAMINE HCL 50 MG/ML IJ SOLN: 12.5000 mg | Freq: Four times a day (QID) | INTRAMUSCULAR | Status: DC | PRN

## 2018-01-29 MED ORDER — NITROGLYCERIN 2 % TD OINT
0.5000 [in_us] | TOPICAL_OINTMENT | Freq: Once | TRANSDERMAL | Status: AC
  Administered 2018-01-29: 1 [in_us] via TOPICAL
  Filled 2018-01-29: qty 30

## 2018-01-29 MED ORDER — ACETAMINOPHEN 500 MG PO TABS
500.0000 mg | ORAL_TABLET | Freq: Four times a day (QID) | ORAL | Status: DC
  Administered 2018-01-29 – 2018-01-30 (×2): 500 mg via ORAL
  Filled 2018-01-29 (×2): qty 1

## 2018-01-29 MED ORDER — CHLORHEXIDINE GLUCONATE CLOTH 2 % EX PADS: 6.0000 | MEDICATED_PAD | Freq: Once | CUTANEOUS | Status: DC

## 2018-01-29 MED ORDER — PROMETHAZINE HCL 25 MG/ML IJ SOLN: 6.2500 mg | INTRAMUSCULAR | Status: DC | PRN

## 2018-01-29 MED ORDER — POTASSIUM CHLORIDE IN NACL 20-0.45 MEQ/L-% IV SOLN
INTRAVENOUS | Status: DC
  Administered 2018-01-29: 22:00:00 via INTRAVENOUS
  Filled 2018-01-29 (×2): qty 1000

## 2018-01-29 MED ORDER — PHENYLEPHRINE 40 MCG/ML (10ML) SYRINGE FOR IV PUSH (FOR BLOOD PRESSURE SUPPORT)
PREFILLED_SYRINGE | INTRAVENOUS | Status: AC
  Filled 2018-01-29: qty 10

## 2018-01-29 MED ORDER — FENTANYL CITRATE (PF) 250 MCG/5ML IJ SOLN
INTRAMUSCULAR | Status: DC | PRN
  Administered 2018-01-29: 50 ug via INTRAVENOUS
  Administered 2018-01-29: 100 ug via INTRAVENOUS
  Administered 2018-01-29 (×2): 50 ug via INTRAVENOUS

## 2018-01-29 MED ORDER — HYDROMORPHONE HCL 2 MG/ML IJ SOLN
INTRAMUSCULAR | Status: AC
  Filled 2018-01-29: qty 1

## 2018-01-29 MED ORDER — CIPROFLOXACIN IN D5W 400 MG/200ML IV SOLN
400.0000 mg | INTRAVENOUS | Status: AC
  Administered 2018-01-29: 400 mg via INTRAVENOUS
  Filled 2018-01-29: qty 200

## 2018-01-29 MED ORDER — ROCURONIUM BROMIDE 10 MG/ML (PF) SYRINGE
PREFILLED_SYRINGE | INTRAVENOUS | Status: AC
  Filled 2018-01-29: qty 5

## 2018-01-29 MED ORDER — MIDAZOLAM HCL 5 MG/5ML IJ SOLN
INTRAMUSCULAR | Status: DC | PRN
  Administered 2018-01-29: 2 mg via INTRAVENOUS

## 2018-01-29 MED ORDER — LIDOCAINE 2% (20 MG/ML) 5 ML SYRINGE
INTRAMUSCULAR | Status: AC
  Filled 2018-01-29: qty 5

## 2018-01-29 MED ORDER — CELECOXIB 200 MG PO CAPS
200.0000 mg | ORAL_CAPSULE | ORAL | Status: AC
  Administered 2018-01-29: 200 mg via ORAL
  Filled 2018-01-29: qty 1

## 2018-01-29 MED ORDER — PROPOFOL 500 MG/50ML IV EMUL
INTRAVENOUS | Status: DC | PRN
  Administered 2018-01-29: 25 ug/kg/min via INTRAVENOUS

## 2018-01-29 MED ORDER — DEXAMETHASONE SODIUM PHOSPHATE 10 MG/ML IJ SOLN
INTRAMUSCULAR | Status: DC | PRN
  Administered 2018-01-29: 10 mg via INTRAVENOUS

## 2018-01-29 MED ORDER — BUPIVACAINE HCL (PF) 0.25 % IJ SOLN
INTRAMUSCULAR | Status: AC
  Filled 2018-01-29: qty 30

## 2018-01-29 MED ORDER — SCOPOLAMINE 1 MG/3DAYS TD PT72
MEDICATED_PATCH | TRANSDERMAL | Status: AC
  Filled 2018-01-29: qty 1

## 2018-01-29 MED ORDER — EPHEDRINE SULFATE-NACL 50-0.9 MG/10ML-% IV SOSY
PREFILLED_SYRINGE | INTRAVENOUS | Status: DC | PRN
  Administered 2018-01-29 (×7): 10 mg via INTRAVENOUS

## 2018-01-29 MED ORDER — SENNA 8.6 MG PO TABS
1.0000 | ORAL_TABLET | Freq: Two times a day (BID) | ORAL | Status: DC
  Administered 2018-01-29 – 2018-01-30 (×2): 8.6 mg via ORAL
  Filled 2018-01-29 (×2): qty 1

## 2018-01-29 MED ORDER — ONDANSETRON HCL 4 MG/2ML IJ SOLN
INTRAMUSCULAR | Status: DC | PRN
  Administered 2018-01-29: 4 mg via INTRAVENOUS

## 2018-01-29 MED ORDER — GLYCOPYRROLATE 0.2 MG/ML IJ SOLN
INTRAMUSCULAR | Status: DC | PRN
  Administered 2018-01-29 (×2): 0.2 mg via INTRAVENOUS

## 2018-01-29 MED ORDER — HYDROCODONE-ACETAMINOPHEN 5-325 MG PO TABS
ORAL_TABLET | ORAL | Status: AC
  Filled 2018-01-29: qty 1

## 2018-01-29 MED ORDER — SODIUM CHLORIDE 0.9 % IV SOLN
INTRAVENOUS | Status: DC | PRN
  Administered 2018-01-29: 500 mL

## 2018-01-29 MED ORDER — PROPOFOL 10 MG/ML IV BOLUS
INTRAVENOUS | Status: DC | PRN
  Administered 2018-01-29: 10 mg via INTRAVENOUS

## 2018-01-29 MED ORDER — HYDROMORPHONE HCL 2 MG/ML IJ SOLN: 0.5000 mg | INTRAMUSCULAR | Status: DC | PRN

## 2018-01-29 MED ORDER — POLYETHYLENE GLYCOL 3350 17 G PO PACK: 17.0000 g | PACK | Freq: Every day | ORAL | Status: DC | PRN

## 2018-01-29 MED ORDER — GABAPENTIN 300 MG PO CAPS
300.0000 mg | ORAL_CAPSULE | ORAL | Status: AC
  Administered 2018-01-29: 300 mg via ORAL
  Filled 2018-01-29: qty 1

## 2018-01-29 MED ORDER — LIDOCAINE 2% (20 MG/ML) 5 ML SYRINGE
INTRAMUSCULAR | Status: DC | PRN
  Administered 2018-01-29: 60 mg via INTRAVENOUS

## 2018-01-29 SURGICAL SUPPLY — 82 items
ADH SKN CLS APL DERMABOND .7 (GAUZE/BANDAGES/DRESSINGS) ×1
APPLIER CLIP 9.375 MED OPEN (MISCELLANEOUS) ×2
APR CLP MED 9.3 20 MLT OPN (MISCELLANEOUS) ×1
BINDER BREAST 3XL (GAUZE/BANDAGES/DRESSINGS) ×1 IMPLANT
BINDER BREAST XXLRG (GAUZE/BANDAGES/DRESSINGS) IMPLANT
BIOPATCH RED 1 DISK 7.0 (GAUZE/BANDAGES/DRESSINGS) ×3 IMPLANT
BLADE 10 SAFETY STRL DISP (BLADE) ×3 IMPLANT
BLADE HEX COATED 2.75 (ELECTRODE) ×1 IMPLANT
BLADE SURG 15 STRL LF DISP TIS (BLADE) ×3 IMPLANT
BLADE SURG 15 STRL SS (BLADE)
BNDG GAUZE ELAST 4 BULKY (GAUZE/BANDAGES/DRESSINGS) ×2 IMPLANT
CANISTER SUCTION 1200CC (MISCELLANEOUS) ×2 IMPLANT
CHLORAPREP W/TINT 26ML (MISCELLANEOUS) ×5 IMPLANT
CLIP APPLIE 9.375 MED OPEN (MISCELLANEOUS) ×1 IMPLANT
COVER SURGICAL LIGHT HANDLE (MISCELLANEOUS) ×2 IMPLANT
DECANTER SPIKE VIAL GLASS SM (MISCELLANEOUS) IMPLANT
DERMABOND ADVANCED (GAUZE/BANDAGES/DRESSINGS) ×1
DERMABOND ADVANCED .7 DNX12 (GAUZE/BANDAGES/DRESSINGS) ×3 IMPLANT
DEVICE DUBIN SPECIMEN MAMMOGRA (MISCELLANEOUS) IMPLANT
DRAIN CHANNEL 19F RND (DRAIN) ×1 IMPLANT
DRAPE CHEST BREAST 15X10 FENES (DRAPES) ×1 IMPLANT
DRAPE LAPAROSCOPIC ABDOMINAL (DRAPES) ×1 IMPLANT
DRAPE SURG 17X23 STRL (DRAPES) ×1 IMPLANT
DRAPE UTILITY XL STRL (DRAPES) ×1 IMPLANT
DRSG PAD ABDOMINAL 8X10 ST (GAUZE/BANDAGES/DRESSINGS) ×2 IMPLANT
DRSG TEGADERM 2-3/8X2-3/4 SM (GAUZE/BANDAGES/DRESSINGS) ×1 IMPLANT
ELECT BLADE 4.0 EZ CLEAN MEGAD (MISCELLANEOUS) ×2
ELECT CAUTERY BLADE 6.4 (BLADE) ×2 IMPLANT
ELECT COATED BLADE 2.86 ST (ELECTRODE) ×1 IMPLANT
ELECT REM PT RETURN 9FT ADLT (ELECTROSURGICAL) ×2
ELECTRODE BLDE 4.0 EZ CLN MEGD (MISCELLANEOUS) ×1 IMPLANT
ELECTRODE REM PT RTRN 9FT ADLT (ELECTROSURGICAL) ×2 IMPLANT
EVACUATOR SILICONE 100CC (DRAIN) ×1 IMPLANT
GAUZE SPONGE 4X4 12PLY STRL (GAUZE/BANDAGES/DRESSINGS) ×1 IMPLANT
GAUZE SPONGE 4X4 16PLY XRAY LF (GAUZE/BANDAGES/DRESSINGS) ×4 IMPLANT
GLOVE BIO SURGEON STRL SZ 6.5 (GLOVE) ×5 IMPLANT
GLOVE BIO SURGEON STRL SZ7 (GLOVE) ×1 IMPLANT
GLOVE BIO SURGEON STRL SZ7.5 (GLOVE) ×2 IMPLANT
GLOVE BIOGEL PI IND STRL 7.0 (GLOVE) IMPLANT
GLOVE BIOGEL PI INDICATOR 7.0 (GLOVE) ×2
GLOVE SURG SS PI 6.5 STRL IVOR (GLOVE) ×1 IMPLANT
GLOVE SURG SS PI 8.0 STRL IVOR (GLOVE) ×1 IMPLANT
GOWN STRL REUS W/ TWL LRG LVL3 (GOWN DISPOSABLE) ×4 IMPLANT
GOWN STRL REUS W/TWL LRG LVL3 (GOWN DISPOSABLE) ×8
KIT BASIN OR (CUSTOM PROCEDURE TRAY) ×2 IMPLANT
KIT MARKER MARGIN INK (KITS) IMPLANT
KIT TURNOVER KIT B (KITS) ×2 IMPLANT
LIGHT WAVEGUIDE WIDE FLAT (MISCELLANEOUS) IMPLANT
MARKER PEN SURG W/LABELS BLK (STERILIZATION PRODUCTS) ×1 IMPLANT
NDL HYPO 25GX1X1/2 BEV (NEEDLE) ×1 IMPLANT
NDL SPNL 22GX3.5 QUINCKE BK (NEEDLE) ×1 IMPLANT
NEEDLE HYPO 25GX1X1/2 BEV (NEEDLE) ×2 IMPLANT
NEEDLE SPNL 22GX3.5 QUINCKE BK (NEEDLE) IMPLANT
NS IRRIG 1000ML POUR BTL (IV SOLUTION) ×3 IMPLANT
PACK GENERAL/GYN (CUSTOM PROCEDURE TRAY) ×2 IMPLANT
PACK SURGICAL SETUP 50X90 (CUSTOM PROCEDURE TRAY) ×1 IMPLANT
PAD ABD 8X10 STRL (GAUZE/BANDAGES/DRESSINGS) ×1 IMPLANT
PAD ARMBOARD 7.5X6 YLW CONV (MISCELLANEOUS) ×2 IMPLANT
PENCIL BUTTON HOLSTER BLD 10FT (ELECTRODE) ×1 IMPLANT
SPECIMEN JAR LG PLASTIC EMPTY (MISCELLANEOUS) ×1 IMPLANT
SPECIMEN JAR MEDIUM (MISCELLANEOUS) ×3 IMPLANT
SPECIMEN JAR SMALL (MISCELLANEOUS) ×1 IMPLANT
SPONGE LAP 18X18 X RAY DECT (DISPOSABLE) ×2 IMPLANT
SUT MNCRL AB 3-0 PS2 18 (SUTURE) ×1 IMPLANT
SUT MNCRL AB 4-0 PS2 18 (SUTURE) ×6 IMPLANT
SUT MON AB 3-0 SH 27 (SUTURE) ×2
SUT MON AB 3-0 SH27 (SUTURE) IMPLANT
SUT MON AB 5-0 PS2 18 (SUTURE) ×6 IMPLANT
SUT SILK 2 0 SH (SUTURE) ×1 IMPLANT
SUT SILK 3 0 PS 1 (SUTURE) IMPLANT
SUT VIC AB 3-0 SH 18 (SUTURE) ×2 IMPLANT
SUT VIC AB 3-0 SH 27 (SUTURE) ×6
SUT VIC AB 3-0 SH 27X BRD (SUTURE) ×2 IMPLANT
SUT VICRYL 4-0 PS2 18IN ABS (SUTURE) ×2 IMPLANT
SYR 50ML LL SCALE MARK (SYRINGE) IMPLANT
SYR BULB 3OZ (MISCELLANEOUS) ×1 IMPLANT
SYR BULB IRRIGATION 50ML (SYRINGE) ×1 IMPLANT
SYR CONTROL 10ML LL (SYRINGE) ×2 IMPLANT
TOWEL OR 17X24 6PK STRL BLUE (TOWEL DISPOSABLE) ×4 IMPLANT
TUBE CONNECTING 20X1/4 (TUBING) ×1 IMPLANT
UNDERPAD 30X30 (UNDERPADS AND DIAPERS) ×2 IMPLANT
YANKAUER SUCT BULB TIP NO VENT (SUCTIONS) ×1 IMPLANT

## 2018-01-29 NOTE — Anesthesia Procedure Notes (Signed)
Procedure Name: Intubation Date/Time: 01/29/2018 12:14 PM Performed by: Renato Shin, CRNA Pre-anesthesia Checklist: Patient identified, Emergency Drugs available, Suction available and Patient being monitored Patient Re-evaluated:Patient Re-evaluated prior to induction Oxygen Delivery Method: Circle system utilized Preoxygenation: Pre-oxygenation with 100% oxygen Induction Type: IV induction Ventilation: Mask ventilation without difficulty Laryngoscope Size: Miller and 2 Grade View: Grade I Tube type: Oral Tube size: 7.0 mm Number of attempts: 1 Airway Equipment and Method: Stylet Placement Confirmation: ETT inserted through vocal cords under direct vision,  positive ETCO2,  CO2 detector and breath sounds checked- equal and bilateral Secured at: 20 cm Tube secured with: Tape Dental Injury: Teeth and Oropharynx as per pre-operative assessment

## 2018-01-29 NOTE — Op Note (Signed)
01/29/2018  4:34 PM  PATIENT:  Makayla Gibson  58 y.o. female  PRE-OPERATIVE DIAGNOSIS:  LARGE LEFT BREAST CYST  POST-OPERATIVE DIAGNOSIS:  LARGE LEFT BREAST CYST  PROCEDURE:  Procedure(s): LEFT BREAST LUMPECTOMY (Left) DEBRIDEMENT AND CLOSURE WOUND (Left) MAMMARY REDUCTION  (BREAST) FOR SYMETRY (Left)  SURGEON:  Surgeon(s) and Role: Panel 1:    Jovita Kussmaul, MD - Primary Panel 2:    * Dillingham, Loel Lofty, DO - Primary  PHYSICIAN ASSISTANT:   ASSISTANTS: none   ANESTHESIA:   general  EBL:  200 mL   BLOOD ADMINISTERED:none  DRAINS: (1) Jackson-Pratt drain(s) with closed bulb suction in the breast   LOCAL MEDICATIONS USED:  NONE  SPECIMEN:  Source of Specimen:  large cyst left breast  DISPOSITION OF SPECIMEN:  PATHOLOGY  COUNTS:  YES  TOURNIQUET:  * No tourniquets in log *  DICTATION: .Dragon Dictation   After informed consent was obtained the patient was brought to the operating room and placed in the supine position on the operating table.  After adequate induction of general anesthesia the patient's bilateral chest, breast, and axillary areas were prepped with ChloraPrep, allowed to dry, and draped in usual sterile manner.  An appropriate timeout was performed.  A lateral incision was made on the left breast overlying the palpable mass with a 15 blade knife.  The incision was carried through the skin and subcutaneous tissue sharply with electrocautery.  The cyst was palpable.  The cyst was separated from the rest of the breast tissue sharply with the electrocautery.  We did enter the cyst at one point and a large amount of old cloudy hematoma was evacuated.  Once the cyst was removed and the area was fulgurated with the cautery until it was completely hemostatic.  At this point the operation was turned over to Dr. Marla Roe for the reduction and reconstruction of the breast.  Her portion will be dictated separately.  The patient tolerated this portion of the  procedure well.  At the end of this portion all needle sponge and instrument counts were correct.  The patient was in stable condition  PLAN OF CARE: Admit for overnight observation  PATIENT DISPOSITION:  PACU - hemodynamically stable.   Delay start of Pharmacological VTE agent (>24hrs) due to surgical blood loss or risk of bleeding: no

## 2018-01-29 NOTE — Transfer of Care (Signed)
Immediate Anesthesia Transfer of Care Note  Patient: Makayla Gibson  Procedure(s) Performed: LEFT BREAST LUMPECTOMY (Left Breast) DEBRIDEMENT AND CLOSURE WOUND (Left Breast) MAMMARY REDUCTION  (BREAST) FOR SYMETRY (Left Breast)  Patient Location: PACU  Anesthesia Type:General  Level of Consciousness: awake, alert  and oriented  Airway & Oxygen Therapy: Patient Spontanous Breathing and Patient connected to nasal cannula oxygen  Post-op Assessment: Report given to RN, Post -op Vital signs reviewed and stable and Patient moving all extremities X 4  Post vital signs: Reviewed and stable  Last Vitals:  Vitals Value Taken Time  BP 116/75 01/29/2018  3:32 PM  Temp    Pulse 96 01/29/2018  3:34 PM  Resp 14 01/29/2018  3:34 PM  SpO2 90 % 01/29/2018  3:34 PM  Vitals shown include unvalidated device data.  Last Pain:  Vitals:   01/29/18 0956  TempSrc:   PainSc: 0-No pain         Complications: No apparent anesthesia complications

## 2018-01-29 NOTE — Interval H&P Note (Signed)
History and Physical Interval Note:  01/29/2018 11:01 AM  Anice Paganini  has presented today for surgery, with the diagnosis of LARGE LEFT BREAST CYST  The various methods of treatment have been discussed with the patient and family. After consideration of risks, benefits and other options for treatment, the patient has consented to  Procedure(s): LEFT BREAST LUMPECTOMY (Left) DEBRIDEMENT AND CLOSURE WOUND (Left) MAMMARY REDUCTION  (BREAST) FOR SYMETRY (Left) as a surgical intervention .  The patient's history has been reviewed, patient examined, no change in status, stable for surgery.  I have reviewed the patient's chart and labs.  Questions were answered to the patient's satisfaction.     TOTH III,PAUL S

## 2018-01-29 NOTE — Anesthesia Preprocedure Evaluation (Addendum)
Anesthesia Evaluation  Patient identified by MRN, date of birth, ID band Patient awake    Reviewed: Allergy & Precautions, NPO status , Patient's Chart, lab work & pertinent test results  Airway Mallampati: I  TM Distance: >3 FB Neck ROM: Full    Dental  (+) Teeth Intact, Dental Advisory Given   Pulmonary sleep apnea , former smoker,    breath sounds clear to auscultation       Cardiovascular  Rhythm:Regular Rate:Normal     Neuro/Psych negative neurological ROS  negative psych ROS   GI/Hepatic negative GI ROS, Neg liver ROS,   Endo/Other  Hypothyroidism   Renal/GU negative Renal ROS     Musculoskeletal  (+) Arthritis ,   Abdominal (+) + obese,   Peds  Hematology   Anesthesia Other Findings   Reproductive/Obstetrics                            Lab Results  Component Value Date   WBC 10.4 01/24/2018   HGB 12.8 01/24/2018   HCT 38.8 01/24/2018   MCV 93.5 01/24/2018   PLT 233 01/24/2018   Lab Results  Component Value Date   CREATININE 0.64 01/24/2018   BUN 26 (H) 01/24/2018   NA 139 01/24/2018   K 4.3 01/24/2018   CL 103 01/24/2018   CO2 25 01/24/2018   No results found for: INR, PROTIME  EKG: sinus bradycardia, RBBB.  Anesthesia Physical Anesthesia Plan  ASA: III  Anesthesia Plan: General   Post-op Pain Management:    Induction: Intravenous  PONV Risk Score and Plan: 4 or greater and Ondansetron, Dexamethasone, Midazolam and Scopolamine patch - Pre-op  Airway Management Planned: Oral ETT  Additional Equipment: None  Intra-op Plan:   Post-operative Plan: Extubation in OR  Informed Consent: I have reviewed the patients History and Physical, chart, labs and discussed the procedure including the risks, benefits and alternatives for the proposed anesthesia with the patient or authorized representative who has indicated his/her understanding and acceptance.   Dental  advisory given  Plan Discussed with: CRNA  Anesthesia Plan Comments:       Anesthesia Quick Evaluation

## 2018-01-29 NOTE — H&P (Signed)
Makayla Gibson  Location: [facility] Surgery Patient #: [id number] DOB: 03-27-1961 Married / Language: English / Race: White Female   History of Present Illness  The patient is a 57 year old female who presents for a follow-up for Breast mass. The patient is a 57 year old white female who is one year status post excision of a large abscess cavity from the right breast. She tolerated this well. Several months ago she ran into a door with her left breast and developed a large hematoma. Since that time the hematoma has developed into a large mass encompassing a good portion of the upper portion of the left breast. There are no overlying skin changes. She had an ultrasound performed which showed a complex cystic mass approximately 10 cm in diameter in the upper portion of the left breast. It was described in the report as suspicious.   Medication History  Vitamin D (1000UNIT Tablet, Oral) Active. Atorvastatin Calcium (40MG  Tablet, Oral) Active. Ciprofloxacin HCl (500MG  Tablet, Oral) Active. Furosemide (40MG  Tablet, Oral) Active. Levothyroxine Sodium (100MCG Tablet, Oral) Active. Potassium Chloride ER (10MEQ Capsule ER, Oral) Active. Ferrocite (324MG  Tablet, Oral) Active. Illusions C Breast Prosthesis (1 (one)) Active. [phone] Post-mastectomy camisole - to hold drain tubes and wear during healing.; QTY: 4; Length of use: 3 Months 3 Refills ; L8000 Post-surgical bras - to hold breast prosthesis ; QTY: 12; Length of use:3 Months 3 Refills ; Replacement: Healing and daily wear. Bras wear fast due to size of prosthesis; L8020Non-silicone breast prosthesis - for temporary use during healing or for comfort at end of day, lighter weight, cooler for hot weather.; QTY: 2 for single; Length of use: 6 months Up to 1 refill; Replacement: Healing & increased comfort for end of day use; L8756 Silicone Breast Prosthesis - to restore balance and symmetry after breast surgery; QTY: 2 for single,  ; Length of use: 2 years No refills; Replacement: One for daily wear, one for swimming.) Medications Reconciled    Review of Systems  General Not Present- Appetite Loss, Chills, Fatigue, Fever, Night Sweats, Weight Gain and Weight Loss. Skin Not Present- Change in Wart/Mole, Dryness, Hives, Jaundice, New Lesions, Non-Healing Wounds, Rash and Ulcer. HEENT Present- Wears glasses/contact lenses. Not Present- Earache, Hearing Loss, Hoarseness, Nose Bleed, Oral Ulcers, Ringing in the Ears, Seasonal Allergies, Sinus Pain, Sore Throat, Visual Disturbances and Yellow Eyes. Breast Present- Breast Mass, Breast Pain and Skin Changes. Not Present- Nipple Discharge. Cardiovascular Present- Swelling of Extremities. Not Present- Chest Pain, Difficulty Breathing Lying Down, Leg Cramps, Palpitations, Rapid Heart Rate and Shortness of Breath. Gastrointestinal Present- Constipation. Not Present- Abdominal Pain, Bloating, Bloody Stool, Change in Bowel Habits, Chronic diarrhea, Difficulty Swallowing, Excessive gas, Gets full quickly at meals, Hemorrhoids, Indigestion, Nausea, Rectal Pain and Vomiting. Female Genitourinary Present- Urgency. Not Present- Frequency, Nocturia, Painful Urination and Pelvic Pain. Musculoskeletal Present- Back Pain, Joint Pain, Joint Stiffness and Swelling of Extremities. Not Present- Muscle Pain and Muscle Weakness. Neurological Present- Numbness. Not Present- Decreased Memory, Fainting, Headaches, Seizures, Tingling, Tremor, Trouble walking and Weakness. Psychiatric Not Present- Anxiety, Bipolar, Change in Sleep Pattern, Depression, Fearful and Frequent crying. Endocrine Present- Hot flashes. Not Present- Cold Intolerance, Excessive Hunger, Hair Changes, Heat Intolerance and New Diabetes. Hematology Not Present- Blood Thinners, Easy Bruising, Excessive bleeding, Gland problems, HIV and Persistent Infections.  Vitals  Weight: 230.13 lb Height: 64in Body Surface Area: 2.08 m Body  Mass Index: 39.5 kg/m  Temp.: 97.7F  Pulse: 95 (Regular)  P.OX: 97% (Room air) BP: 100/68 (  Sitting, Left Arm, Standard)       Physical Exam  General Mental Status-Alert. General Appearance-Consistent with stated age. Hydration-Well hydrated. Voice-Normal.  Head and Neck Head-normocephalic, atraumatic with no lesions or palpable masses. Trachea-midline. Thyroid Gland Characteristics - normal size and consistency.  Eye Eyeball - Bilateral-Extraocular movements intact. Sclera/Conjunctiva - Bilateral-No scleral icterus.  Chest and Lung Exam Chest and lung exam reveals -quiet, even and easy respiratory effort with no use of accessory muscles and on auscultation, normal breath sounds, no adventitious sounds and normal vocal resonance. Inspection Chest Wall - Normal. Back - normal.  Breast Note: There is no palpable mass in the right breast. The previous scar in the upper outer right breast is healing nicely. There is a large palpable lobular mass at least 10-15 cm in diameter encompassing a good portion of the upper portion of the left breast. There is no palpable axillary, supraclavicular, or cervical lymphadenopathy.   Cardiovascular Cardiovascular examination reveals -normal heart sounds, regular rate and rhythm with no murmurs and normal pedal pulses bilaterally.  Abdomen Inspection Inspection of the abdomen reveals - No Hernias. Skin - Scar - no surgical scars. Palpation/Percussion Palpation and Percussion of the abdomen reveal - Soft, Non Tender, No Rebound tenderness, No Rigidity (guarding) and No hepatosplenomegaly. Auscultation Auscultation of the abdomen reveals - Bowel sounds normal.  Neurologic Neurologic evaluation reveals -alert and oriented x 3 with no impairment of recent or remote memory. Mental Status-Normal.  Musculoskeletal Normal Exam - Left-Upper Extremity Strength Normal and Lower Extremity Strength Normal. Normal  Exam - Right-Upper Extremity Strength Normal and Lower Extremity Strength Normal.  Lymphatic Head & Neck  General Head & Neck Lymphatics: Bilateral - Description - Normal. Axillary  General Axillary Region: Bilateral - Description - Normal. Tenderness - Non Tender. Femoral & Inguinal  Generalized Femoral & Inguinal Lymphatics: Bilateral - Description - Normal. Tenderness - Non Tender.    Assessment & Plan  LEFT BREAST MASS (N63.20) Impression: The patient is about 1 year status post excision of a large abscess from the right breast. She now has a large mass in the left breast that is likely an old hematoma. Because of its size and because it is painful she would like to have it removed and I think this is a very reasonable thing to do. I would like to review her films with the radiologist to make sure there are no suspicious areas prior to removing this mass. If any evidence suspicious it would need to be needle biopsy prior to surgery. I will also discuss this with our plastic surgeon to see if we can coordinate a reduction lumpectomy for cosmesis. I have discussed with her in detail the risks and benefits of the operation as well as some of the technical aspects and she understands and wishes to proceed

## 2018-01-29 NOTE — Op Note (Signed)
Breast Reduction Op note:    DATE OF PROCEDURE: 01/29/2018  LOCATION: [facility]  SURGEON: Lyndee Leo Sanger Shaliyah Taite, DO  ASSISTANT: Dr. Autumn Messing  PREOPERATIVE DIAGNOSIS 1. Macromastia 2. Neck Pain  / Back Pain 3. Breast asymmetry  POSTOPERATIVE DIAGNOSIS 1. Macromastia 2. Neck Pain  /  Back Pain 3. Breast asymmetry  PROCEDURES 1. Left breast reduction.  Left reduction 2119 g  COMPLICATIONS: None.  DRAINS: #19 blake  INDICATIONS FOR PROCEDURE Makayla Gibson is a 58 y.o. year old female born on 1961/03/18, with a history of symptomatic macromastia with concominant back pain, neck pain, shoulder grooving from her bra.  She has breast asymmetry due to previous surgery on the right breast.   MRN: [id number]  CONSENT Informed consent was obtained directly from the patient. The risks, benefits and alternatives were fully discussed. Specific risks including but not limited to bleeding, infection, hematoma, seroma, scarring, pain, nipple necrosis, asymmetry, poor cosmetic results, and need for further surgery were discussed. The patient had ample opportunity to have her questions answered to her satisfaction.  DESCRIPTION OF PROCEDURE  Patient was brought into the operating room and placed in a supine position.  SCDs were placed and appropriate padding was performed.  Antibiotics were given. The patient underwent general anesthesia and the chest was prepped and draped in a sterile fashion.  A timeout was performed and all information was confirmed to be correct.  Once general surgery was finished with the lumpectomy the patient was rendered to the plastic surgery service.  Markings were confirmed.  Incision lines were injected with 1% Xylocaine with epinephrine.  After waiting for vasoconstriction, the marked lines were incised.  An inferior pedical breast reduction was performed by de-epithelializing the pedicle, using bovie to create the lateral and  medial pedicles, and removing breast tissue from the superior, lateral, and medial portions of the breast.  Care was taken to not undermine the breast pedicle. Hemostasis was achieved.  The nipple was gently rotated into position and the skin was temporarily closed with staples.  The patient was sat upright and size and shape symmetry was confirmed.  The pocket was irrigated, a drain was placed laterally and secured to the skin with 4-0 Silk.  Hemostasis confirmed.  The deep tissues were approximated with 3-0 Monocryl sutures and the skin was closed with deep dermal and subcuticular 4-0 Monocryl sutures followed by 5-0 Monocryl.  The nipple areola complex was brought out with the skin de-epithelialized at the location to make place for the complex.  The area was secured with 4-0 Vicryl at the deep layers followed by 5-0 Monocryl.  The nipple and skin flaps had good capillary refill at the end of the procedure. The patient tolerated the procedure well. The patient was allowed to wake from anesthesia and taken to the recovery room in satisfactory condition.

## 2018-01-29 NOTE — H&P (Signed)
Makayla Gibson is an 57 y.o. female.   Chief Complaint: breast cancer HPI: The patient is a 57 y.o. yrs old wf here for pre operative history and physical prior to excision of a mass in her left breast.  She had a similar situation on the right side which was excised and negative.  She now has a large mass on the left breast. Ultrasound of the left breast showed a avascular complex mass in the upper outer quadrant consistent with a hematoma.   The sternal notch to inframammary fold on the right is 37 cm and 49 cm on the left.  The nipple areola is 6 cm wide on the right and 7 cm wide on the left.  The inframammary fold to the nipple is 23 cm on the left. The NAC from right to left is 40 cm. This is a significant difference in size. The mass is firm and large.  There will need to be a joint procedure with general surgery for closure and to get the left breast to approximate the size of the right breast.    Past Medical History:  Diagnosis Date  . Abscess of breast [id number]  . Anemia   . Arthritis   . Heart murmur    as a child  . Hyperthyroidism   . Osteoporosis   . Sleep apnea   . Thyroid disease 11/2014    Past Surgical History:  Procedure Laterality Date  . APPENDECTOMY    . BREAST CYST EXCISION Right 09/06/2016  . CESAREAN SECTION     4  . INCISION AND DRAINAGE ABSCESS Right 07/29/2015   Procedure: INCISION AND DRAINAGE BREAST ABSCESS;  Surgeon: Aviva Signs, MD;  Location: [facility];  Service: General;  Laterality: Right;  . MASTECTOMY, PARTIAL Right 09/06/2016   Procedure: EXCISION OF LARGE RIGHT BREAST CHRONIC ABSCESS CAVITY;  Surgeon: Autumn Messing III, MD;  Location: [facility];  Service: General;  Laterality: Right;    Family History  Problem Relation Age of Onset  . Rheumatic fever Mother   . Heart disease Mother   . Osteoporosis Mother   . Alcohol abuse Father   . Cancer Brother        liver  . Osteoporosis Brother   . Heart disease Brother    Social History:  reports that  she quit smoking about 9 years ago. She has never used smokeless tobacco. She reports that she drank alcohol. She reports that she does not use drugs.  Allergies:  Allergies  Allergen Reactions  . Keflex [Cephalexin] Itching and Rash  . Percocet [Oxycodone-Acetaminophen] Itching and Rash    Pt can take oxycodone and tylenol separately   . Percodan [Oxycodone-Aspirin] Itching and Rash    Pt can take oxycodone and aspirin separately     No medications prior to admission.    No results found for this or any previous visit (from the past 48 hour(s)). No results found.  Review of Systems  Constitutional: Negative.   HENT: Negative.   Eyes: Negative.   Respiratory: Negative.   Cardiovascular: Negative.   Gastrointestinal: Negative.   Genitourinary: Negative.   Musculoskeletal: Negative.   Skin: Negative.   Neurological: Negative.   Psychiatric/Behavioral: Negative.     There were no vitals taken for this visit. Physical Exam  Constitutional: She is oriented to person, place, and time. She appears well-developed and well-nourished.  HENT:  Head: Normocephalic and atraumatic.  Eyes: Pupils are equal, round, and reactive to light. EOM are normal.  Cardiovascular: Normal rate.  Respiratory: Effort normal.  GI: Soft.  Neurological: She is alert and oriented to person, place, and time.  Skin: Skin is warm. No rash noted. No erythema.  Psychiatric: She has a normal mood and affect. Her behavior is normal. Judgment and thought content normal.     Assessment/Plan Plan excision of mass/ abscess of left breast in conjunction with general surgery with reduction to improve symmetry of breasts.   [place], DO 01/29/2018, 7:47 AM

## 2018-01-30 ENCOUNTER — Encounter (HOSPITAL_COMMUNITY): Payer: Self-pay

## 2018-01-30 ENCOUNTER — Encounter (HOSPITAL_COMMUNITY): Payer: Self-pay | Admitting: Surgery

## 2018-01-30 ENCOUNTER — Encounter (HOSPITAL_COMMUNITY): Payer: Self-pay | Admitting: General Surgery

## 2018-01-30 DIAGNOSIS — N6002 Solitary cyst of left breast: Secondary | ICD-10-CM | POA: Diagnosis not present

## 2018-01-30 DIAGNOSIS — G4730 Sleep apnea, unspecified: Secondary | ICD-10-CM | POA: Diagnosis not present

## 2018-01-30 DIAGNOSIS — N6489 Other specified disorders of breast: Secondary | ICD-10-CM | POA: Diagnosis not present

## 2018-01-30 DIAGNOSIS — M549 Dorsalgia, unspecified: Secondary | ICD-10-CM | POA: Diagnosis not present

## 2018-01-30 DIAGNOSIS — E669 Obesity, unspecified: Secondary | ICD-10-CM | POA: Diagnosis not present

## 2018-01-30 DIAGNOSIS — M542 Cervicalgia: Secondary | ICD-10-CM | POA: Diagnosis not present

## 2018-01-30 DIAGNOSIS — Z79899 Other long term (current) drug therapy: Secondary | ICD-10-CM | POA: Diagnosis not present

## 2018-01-30 DIAGNOSIS — E039 Hypothyroidism, unspecified: Secondary | ICD-10-CM | POA: Diagnosis not present

## 2018-01-30 DIAGNOSIS — N62 Hypertrophy of breast: Secondary | ICD-10-CM | POA: Diagnosis not present

## 2018-01-30 DIAGNOSIS — Z87891 Personal history of nicotine dependence: Secondary | ICD-10-CM | POA: Diagnosis not present

## 2018-01-30 DIAGNOSIS — Z6839 Body mass index (BMI) 39.0-39.9, adult: Secondary | ICD-10-CM | POA: Diagnosis not present

## 2018-01-30 LAB — CBC
HCT: 37 % (ref 36.0–46.0)
HEMOGLOBIN: 12 g/dL (ref 12.0–15.0)
MCH: 30.5 pg (ref 26.0–34.0)
MCHC: 32.4 g/dL (ref 30.0–36.0)
MCV: 93.9 fL (ref 78.0–100.0)
Platelets: 237 10*3/uL (ref 150–400)
RBC: 3.94 MIL/uL (ref 3.87–5.11)
RDW: 13.1 % (ref 11.5–15.5)
WBC: 15.3 10*3/uL — ABNORMAL HIGH (ref 4.0–10.5)

## 2018-01-30 LAB — HIV ANTIBODY (ROUTINE TESTING W REFLEX): HIV SCREEN 4TH GENERATION: NONREACTIVE

## 2018-01-30 NOTE — Anesthesia Postprocedure Evaluation (Signed)
Anesthesia Post Note  Patient: LOISANN ROACH  Procedure(s) Performed: LEFT BREAST LUMPECTOMY (Left Breast) DEBRIDEMENT AND CLOSURE WOUND (Left Breast) MAMMARY REDUCTION  (BREAST) FOR SYMETRY (Left Breast)     Patient location during evaluation: PACU Anesthesia Type: General Level of consciousness: awake and alert Pain management: pain level controlled Vital Signs Assessment: post-procedure vital signs reviewed and stable Respiratory status: spontaneous breathing, nonlabored ventilation, respiratory function stable and patient connected to nasal cannula oxygen Cardiovascular status: blood pressure returned to baseline and stable Postop Assessment: no apparent nausea or vomiting Anesthetic complications: no    Last Vitals:  Vitals:   01/30/18 0453 01/30/18 0740  BP: (!) 106/57 111/60  Pulse: 87 77  Resp: 20 18  Temp: 36.8 C 36.9 C  SpO2: 90% 92%    Last Pain:  Vitals:   01/30/18 1032  TempSrc:   PainSc: 0-No pain                 Effie Berkshire

## 2018-01-30 NOTE — Progress Notes (Signed)
Patient discharged to home with instructions. 

## 2018-01-30 NOTE — Discharge Summary (Signed)
Physician Discharge Summary  Patient ID: Makayla Gibson MRN: [id number] DOB/AGE: 01-01-1961 58 y.o.  Admit date: 01/29/2018 Discharge date: 01/30/2018  Admission Diagnoses:  Discharge Diagnoses:  Active Problems:   Breast mass in female   Discharged Condition: good  Hospital Course: The patient was taken to the OR and underwent resection of a left breast mass / lumpectomy.  She then had a completion reduction.  She did very well and was managed on the surgery unit.  She was tolerating food, pain controlled and ready for home.  Consults: none  Significant Diagnostic Studies: none  Treatments: surgery  Discharge Exam: Blood pressure 111/60, pulse 77, temperature 98.4 F (36.9 C), temperature source Oral, resp. rate 18, height 5\' 4"  (1.626 m), weight 103.7 kg (228 lb 9.9 oz), SpO2 92 %. General appearance: alert, cooperative and no distress Incision/Wound:  Disposition: Discharge disposition: 01-Home or Self Care       Discharge Instructions    Call MD for:  difficulty breathing, headache or visual disturbances   Complete by:  As directed    Call MD for:  hives   Complete by:  As directed    Call MD for:  persistant nausea and vomiting   Complete by:  As directed    Call MD for:  redness, tenderness, or signs of infection (pain, swelling, redness, odor or green/yellow discharge around incision site)   Complete by:  As directed    Call MD for:  severe uncontrolled pain   Complete by:  As directed    Call MD for:  temperature >100.4   Complete by:  As directed    Diet general   Complete by:  As directed    Discharge wound care:   Complete by:  As directed    Drain care   Driving Restrictions   Complete by:  As directed    No driving while on pain meds.   Increase activity slowly   Complete by:  As directed    Lifting restrictions   Complete by:  As directed    No heavy lifting      Follow-up Information    Turon Kilmer, Loel Lofty, DO In 1 week.    Specialty:  Plastic Surgery Contact information: [place] [address] [phone]           Signed: Wallace Going 01/30/2018, 11:16 AM

## 2018-02-03 DIAGNOSIS — J9600 Acute respiratory failure, unspecified whether with hypoxia or hypercapnia: Secondary | ICD-10-CM | POA: Diagnosis not present

## 2018-02-26 ENCOUNTER — Other Ambulatory Visit: Payer: Self-pay | Admitting: Family Medicine

## 2018-02-26 ENCOUNTER — Telehealth: Payer: Self-pay | Admitting: Family Medicine

## 2018-02-26 NOTE — Telephone Encounter (Signed)
Patient notified that rx denied because Vit D level is back to normal level and she should continue with OTC Vit D. Patient verbalized understanding

## 2018-03-06 DIAGNOSIS — J9600 Acute respiratory failure, unspecified whether with hypoxia or hypercapnia: Secondary | ICD-10-CM | POA: Diagnosis not present

## 2018-03-17 ENCOUNTER — Ambulatory Visit: Payer: BLUE CROSS/BLUE SHIELD | Admitting: Family Medicine

## 2018-03-17 ENCOUNTER — Encounter: Payer: Self-pay | Admitting: Family Medicine

## 2018-03-17 ENCOUNTER — Ambulatory Visit (INDEPENDENT_AMBULATORY_CARE_PROVIDER_SITE_OTHER): Payer: BLUE CROSS/BLUE SHIELD

## 2018-03-17 VITALS — BP 101/59 | HR 49 | Temp 97.6°F | Ht 64.0 in | Wt 230.2 lb

## 2018-03-17 DIAGNOSIS — M79672 Pain in left foot: Secondary | ICD-10-CM | POA: Diagnosis not present

## 2018-03-17 DIAGNOSIS — M19072 Primary osteoarthritis, left ankle and foot: Secondary | ICD-10-CM | POA: Diagnosis not present

## 2018-03-17 DIAGNOSIS — M79671 Pain in right foot: Secondary | ICD-10-CM | POA: Diagnosis not present

## 2018-03-17 DIAGNOSIS — E782 Mixed hyperlipidemia: Secondary | ICD-10-CM | POA: Diagnosis not present

## 2018-03-17 DIAGNOSIS — E038 Other specified hypothyroidism: Secondary | ICD-10-CM

## 2018-03-17 DIAGNOSIS — N6029 Fibroadenosis of unspecified breast: Secondary | ICD-10-CM | POA: Diagnosis not present

## 2018-03-17 MED ORDER — POTASSIUM CHLORIDE ER 10 MEQ PO CPCR: ORAL_CAPSULE | ORAL | 1 refills | Status: DC

## 2018-03-17 MED ORDER — FUROSEMIDE 40 MG PO TABS: 40.0000 mg | ORAL_TABLET | Freq: Every day | ORAL | 1 refills | Status: DC

## 2018-03-17 MED ORDER — FERROUS FUMARATE 324 (106 FE) MG PO TABS: 1.0000 | ORAL_TABLET | Freq: Two times a day (BID) | ORAL | 1 refills | Status: DC

## 2018-03-17 MED ORDER — MELOXICAM 15 MG PO TABS: 15.0000 mg | ORAL_TABLET | Freq: Every day | ORAL | 5 refills | Status: DC

## 2018-03-17 MED ORDER — LEVOTHYROXINE SODIUM 100 MCG PO TABS: 100.0000 ug | ORAL_TABLET | Freq: Every day | ORAL | 1 refills | Status: DC

## 2018-03-17 MED ORDER — ATORVASTATIN CALCIUM 40 MG PO TABS: 40.0000 mg | ORAL_TABLET | Freq: Every day | ORAL | 1 refills | Status: DC

## 2018-03-17 NOTE — Patient Instructions (Signed)
Fat and Cholesterol Restricted Diet Getting too much fat and cholesterol in your diet may cause health problems. Following this diet helps keep your fat and cholesterol at normal levels. This can keep you from getting sick. What types of fat should I choose?  Choose monosaturated and polyunsaturated fats. These are found in foods such as olive oil, canola oil, flaxseeds, walnuts, almonds, and seeds.  Eat more omega-3 fats. Good choices include salmon, mackerel, sardines, tuna, flaxseed oil, and ground flaxseeds.  Limit saturated fats. These are in animal products such as meats, butter, and cream. They can also be in plant products such as palm oil, palm kernel oil, and coconut oil.  Avoid foods with partially hydrogenated oils in them. These contain trans fats. Examples of foods that have trans fats are stick margarine, some tub margarines, cookies, crackers, and other baked goods. What general guidelines do I need to follow?  Check food labels. Look for the words "trans fat" and "saturated fat."  When preparing a meal: ? Fill half of your plate with vegetables and green salads. ? Fill one fourth of your plate with whole grains. Look for the word "whole" as the first word in the ingredient list. ? Fill one fourth of your plate with lean protein foods.  Eat more foods that have fiber, like apples, carrots, beans, peas, and barley.  Eat more home-cooked foods. Eat less at restaurants and buffets.  Limit or avoid alcohol.  Limit foods high in starch and sugar.  Limit fried foods.  Cook foods without frying them. Baking, boiling, grilling, and broiling are all great options.  Lose weight if you are overweight. Losing even a small amount of weight can help your overall health. It can also help prevent diseases such as diabetes and heart disease. What foods can I eat? Grains Whole grains, such as whole wheat or whole grain breads, crackers, cereals, and pasta. Unsweetened oatmeal,  bulgur, barley, quinoa, or brown rice. Corn or whole wheat flour tortillas. Vegetables Fresh or frozen vegetables (raw, steamed, roasted, or grilled). Green salads. Fruits All fresh, canned (in natural juice), or frozen fruits. Meat and Other Protein Products Ground beef (85% or leaner), grass-fed beef, or beef trimmed of fat. Skinless chicken or turkey. Ground chicken or turkey. Pork trimmed of fat. All fish and seafood. Eggs. Dried beans, peas, or lentils. Unsalted nuts or seeds. Unsalted canned or dry beans. Dairy Low-fat dairy products, such as skim or 1% milk, 2% or reduced-fat cheeses, low-fat ricotta or cottage cheese, or plain low-fat yogurt. Fats and Oils Tub margarines without trans fats. Light or reduced-fat mayonnaise and salad dressings. Avocado. Olive, canola, sesame, or safflower oils. Natural peanut or almond butter (choose ones without added sugar and oil). The items listed above may not be a complete list of recommended foods or beverages. Contact your dietitian for more options. What foods are not recommended? Grains White bread. White pasta. White rice. Cornbread. Bagels, pastries, and croissants. Crackers that contain trans fat. Vegetables White potatoes. Corn. Creamed or fried vegetables. Vegetables in a cheese sauce. Fruits Dried fruits. Canned fruit in light or heavy syrup. Fruit juice. Meat and Other Protein Products Fatty cuts of meat. Ribs, chicken wings, bacon, sausage, bologna, salami, chitterlings, fatback, hot dogs, bratwurst, and packaged luncheon meats. Liver and organ meats. Dairy Whole or 2% milk, cream, half-and-half, and cream cheese. Whole milk cheeses. Whole-fat or sweetened yogurt. Full-fat cheeses. Nondairy creamers and whipped toppings. Processed cheese, cheese spreads, or cheese curds. Sweets and Desserts Corn   syrup, sugars, honey, and molasses. Candy. Jam and jelly. Syrup. Sweetened cereals. Cookies, pies, cakes, donuts, muffins, and ice  cream. Fats and Oils Butter, stick margarine, lard, shortening, ghee, or bacon fat. Coconut, palm kernel, or palm oils. Beverages Alcohol. Sweetened drinks (such as sodas, lemonade, and fruit drinks or punches). The items listed above may not be a complete list of foods and beverages to avoid. Contact your dietitian for more information. This information is not intended to replace advice given to you by your health care provider. Make sure you discuss any questions you have with your health care provider. Document Released: 03/18/2012 Document Revised: 05/24/2016 Document Reviewed: 12/17/2013 [employer] Interactive Patient Education  2018 [employer]  

## 2018-03-17 NOTE — Progress Notes (Signed)
Subjective:  Patient ID: Makayla Gibson, female    DOB: August 07, 1961  Age: 57 y.o. MRN: [id number]  CC: Medical Management of Chronic Issues   HPI Makayla Gibson presents for patient presents for follow-up on  thyroid. The patient has a history of hypothyroidism for many years. It has been stable recently. Pt. denies any change in  voice, loss of hair, heat or cold intolerance. Energy level has been adequate to good. Patient denies constipation and diarrhea. No myxedema. Medication is as noted below. Verified that pt is taking it daily on an empty stomach. Well tolerated. Patient in for follow-up of elevated cholesterol. Doing well without complaints on current medication. Denies side effects of statin including myalgia and arthralgia and nausea. Also in today for liver function testing. Currently no chest pain, shortness of breath or other cardiovascular related symptoms noted. Patient has been approved for right breast reduction.  She had a cyst removed from the left side that she describes as having been half the size of a football according to what she was told by the surgeon.  Because that reduced the size of the left she has now been approved for a breast reduction on the right to allow her breast sizes to match.  Depression screen [facility] 2/9 03/17/2018 09/16/2017 03/15/2017  Decreased Interest 0 0 0  Down, Depressed, Hopeless 0 0 0  PHQ - 2 Score 0 0 0    History Makayla Gibson has a past medical history of Abscess of breast ([id number]), Anemia, Arthritis, Heart murmur, Hyperthyroidism, Osteoporosis, Sleep apnea, and Thyroid disease (11/2014).   She has a past surgical history that includes Cesarean section; Appendectomy; Incision and drainage abscess (Right, 07/29/2015); Breast cyst excision (Right, 09/06/2016); Mastectomy, partial (Right, 09/06/2016); Breast lumpectomy (Left, 01/29/2018); Debridement and closure wound (Left, 01/29/2018); and Breast reduction surgery (Left, 01/29/2018).   Her  family history includes Alcohol abuse in her father; Cancer in her brother; Heart disease in her brother and mother; Osteoporosis in her brother and mother; Rheumatic fever in her mother.She reports that she quit smoking about 9 years ago. She has never used smokeless tobacco. She reports that she drank alcohol. She reports that she does not use drugs.    ROS Review of Systems  Constitutional: Negative.   HENT: Negative for congestion.   Eyes: Negative for visual disturbance.  Respiratory: Negative for shortness of breath.   Cardiovascular: Negative for chest pain.  Gastrointestinal: Negative for abdominal pain, constipation, diarrhea, nausea and vomiting.  Genitourinary: Negative for difficulty urinating.  Musculoskeletal: Positive for arthralgias and gait problem (She is on her feet quite a bit at work and is having pain at the dorsal forefoot base of the toes bilaterally). Negative for myalgias.  Neurological: Negative for headaches.  Psychiatric/Behavioral: Negative for sleep disturbance.    Objective:  BP (!) 101/59   Pulse (!) 49   Temp 97.6 F (36.4 C) (Oral)   Ht _0  (1.626 m)   Wt 230 lb 4 oz (104.4 kg)   BMI 39.52 kg/m   BP Readings from Last 3 Encounters:  03/17/18 (!) 101/59  01/30/18 107/63  01/24/18 125/83    Wt Readings from Last 3 Encounters:  03/17/18 230 lb 4 oz (104.4 kg)  01/29/18 228 lb 9.9 oz (103.7 kg)  01/24/18 229 lb 4.5 oz (104 kg)     Physical Exam  Constitutional: She is oriented to person, place, and time. She appears well-developed and well-nourished. No distress.  HENT:  Head: Normocephalic and atraumatic.  Right Ear: External ear normal.  Left Ear: External ear normal.  Nose: Nose normal.  Mouth/Throat: Oropharynx is clear and moist.  Eyes: Pupils are equal, round, and reactive to light. Conjunctivae and EOM are normal.  Neck: Normal range of motion. Neck supple. No thyromegaly present.  Cardiovascular: Normal rate, regular rhythm  and normal heart sounds.  No murmur heard. Pulmonary/Chest: Effort normal and breath sounds normal. No respiratory distress. She has no wheezes. She has no rales.  Abdominal: Soft. Bowel sounds are normal. She exhibits no distension. There is no tenderness.  Lymphadenopathy:    She has no cervical adenopathy.  Neurological: She is alert and oriented to person, place, and time. She has normal reflexes.  Skin: Skin is warm and dry.  Psychiatric: She has a normal mood and affect. Her behavior is normal. Judgment and thought content normal.   Foot x-rays: No acute abnormality.  No arthritic changes appreciated.   Assessment & Plan:   Makayla Gibson was seen today for medical management of chronic issues.  Diagnoses and all orders for this visit:  Other specified hypothyroidism -     TSH -     T4, Free  Mixed hyperlipidemia -     CBC with Differential/Platelet -     CMP14+EGFR -     Lipid panel  Foot pain, bilateral -     DG Foot Complete Left; Future -     DG Foot Complete Right; Future  Cystic fibroadenosis of breast, unspecified laterality  Other orders -     meloxicam (MOBIC) 15 MG tablet; Take 1 tablet (15 mg total) by mouth daily with supper. -     atorvastatin (LIPITOR) 40 MG tablet; Take 1 tablet (40 mg total) by mouth daily. Take for cholesterol control -     Ferrous Fumarate (FERROCITE) 324 (106 Fe) MG TABS tablet; Take 1 tablet (106 mg of iron total) by mouth 2 (two) times daily. -     furosemide (LASIX) 40 MG tablet; Take 1 tablet (40 mg total) by mouth daily. -     levothyroxine (SYNTHROID, LEVOTHROID) 100 MCG tablet; Take 1 tablet (100 mcg total) by mouth daily. -     potassium chloride (MICRO-K) 10 MEQ CR capsule; TAKE 1 CAPSULE BY MOUTH EVERY DAY       I have discontinued Makayla Gibson. Makayla Gibson's Vitamin D (Ergocalciferol). I have also changed her furosemide and levothyroxine. Additionally, I am having her start on meloxicam. Lastly, I am having her maintain her  multivitamin with minerals, Naphazoline-Glycerin, acetaminophen, polyethylene glycol, alendronate, atorvastatin, Ferrous Fumarate, and potassium chloride.  Allergies as of 03/17/2018      Reactions   Keflex [cephalexin] Itching, Rash   Percocet [oxycodone-acetaminophen] Itching, Rash   Pt can take oxycodone and tylenol separately    Percodan [oxycodone-aspirin] Itching, Rash   Pt can take oxycodone and aspirin separately       Medication List        Accurate as of 03/17/18  7:40 PM. Always use your most recent med list.          acetaminophen 500 MG tablet Commonly known as:  TYLENOL Take 500-1,000 mg by mouth daily as needed for headache.   alendronate 70 MG tablet Commonly known as:  FOSAMAX Take 1 tablet (70 mg total) by mouth every 7 (seven) days. Take with a full glass of water on an empty stomach.   atorvastatin 40 MG tablet Commonly known as:  LIPITOR Take 1 tablet (40 mg total) by mouth  daily. Take for cholesterol control   CLEAR EYES MAX REDNESS RELIEF 0.03-0.5 % Soln Generic drug:  Naphazoline-Glycerin Place 2 drops into both eyes 3 (three) times daily as needed (for redness/irritation.).   Ferrous Fumarate 324 (106 Fe) MG Tabs tablet Commonly known as:  FERROCITE Take 1 tablet (106 mg of iron total) by mouth 2 (two) times daily.   furosemide 40 MG tablet Commonly known as:  LASIX Take 1 tablet (40 mg total) by mouth daily.   levothyroxine 100 MCG tablet Commonly known as:  SYNTHROID, LEVOTHROID Take 1 tablet (100 mcg total) by mouth daily.   meloxicam 15 MG tablet Commonly known as:  MOBIC Take 1 tablet (15 mg total) by mouth daily with supper.   multivitamin with minerals Tabs tablet Take 1 tablet by mouth daily.   polyethylene glycol packet Commonly known as:  MIRALAX / GLYCOLAX Take 17 g by mouth daily as needed for mild constipation.   potassium chloride 10 MEQ CR capsule Commonly known as:  MICRO-K TAKE 1 CAPSULE BY MOUTH EVERY DAY         Follow-up: Return in about 6 months (around 09/16/2018).  Claretta Fraise, M.D.

## 2018-03-18 DIAGNOSIS — N611 Abscess of the breast and nipple: Secondary | ICD-10-CM | POA: Diagnosis not present

## 2018-03-18 LAB — CBC WITH DIFFERENTIAL/PLATELET
Basophils Absolute: 0 10*3/uL (ref 0.0–0.2)
Basos: 0 %
EOS (ABSOLUTE): 0.1 10*3/uL (ref 0.0–0.4)
EOS: 1 %
HEMATOCRIT: 37.6 % (ref 34.0–46.6)
HEMOGLOBIN: 12.5 g/dL (ref 11.1–15.9)
IMMATURE GRANS (ABS): 0 10*3/uL (ref 0.0–0.1)
IMMATURE GRANULOCYTES: 0 %
Lymphocytes Absolute: 2.2 10*3/uL (ref 0.7–3.1)
Lymphs: 24 %
MCH: 30.6 pg (ref 26.6–33.0)
MCHC: 33.2 g/dL (ref 31.5–35.7)
MCV: 92 fL (ref 79–97)
MONOCYTES: 5 %
Monocytes Absolute: 0.4 10*3/uL (ref 0.1–0.9)
NEUTROS PCT: 70 %
Neutrophils Absolute: 6.6 10*3/uL (ref 1.4–7.0)
Platelets: 249 10*3/uL (ref 150–450)
RBC: 4.08 x10E6/uL (ref 3.77–5.28)
RDW: 14 % (ref 12.3–15.4)
WBC: 9.4 10*3/uL (ref 3.4–10.8)

## 2018-03-18 LAB — CMP14+EGFR
ALBUMIN: 4.3 g/dL (ref 3.5–5.5)
ALT: 16 IU/L (ref 0–32)
AST: 15 IU/L (ref 0–40)
Albumin/Globulin Ratio: 2 (ref 1.2–2.2)
Alkaline Phosphatase: 52 IU/L (ref 39–117)
BUN / CREAT RATIO: 48 — AB (ref 9–23)
BUN: 23 mg/dL (ref 6–24)
Bilirubin Total: 0.3 mg/dL (ref 0.0–1.2)
CALCIUM: 9.7 mg/dL (ref 8.7–10.2)
CO2: 26 mmol/L (ref 20–29)
CREATININE: 0.48 mg/dL — AB (ref 0.57–1.00)
Chloride: 105 mmol/L (ref 96–106)
GFR calc Af Amer: 127 mL/min/{1.73_m2} (ref >59)
GFR, EST NON AFRICAN AMERICAN: 110 mL/min/{1.73_m2} (ref >59)
GLOBULIN, TOTAL: 2.1 g/dL (ref 1.5–4.5)
Glucose: 118 mg/dL — ABNORMAL HIGH (ref 65–99)
Potassium: 4.3 mmol/L (ref 3.5–5.2)
SODIUM: 146 mmol/L — AB (ref 134–144)
TOTAL PROTEIN: 6.4 g/dL (ref 6.0–8.5)

## 2018-03-18 LAB — LIPID PANEL
CHOL/HDL RATIO: 3.5 ratio (ref 0.0–4.4)
Cholesterol, Total: 138 mg/dL (ref 100–199)
HDL: 40 mg/dL (ref >39)
LDL CALC: 74 mg/dL (ref 0–99)
TRIGLYCERIDES: 120 mg/dL (ref 0–149)
VLDL Cholesterol Cal: 24 mg/dL (ref 5–40)

## 2018-03-18 LAB — T4, FREE: Free T4: 1.19 ng/dL (ref 0.82–1.77)

## 2018-03-18 LAB — TSH: TSH: 3.88 u[IU]/mL (ref 0.450–4.500)

## 2018-04-05 DIAGNOSIS — J9600 Acute respiratory failure, unspecified whether with hypoxia or hypercapnia: Secondary | ICD-10-CM | POA: Diagnosis not present

## 2018-05-06 DIAGNOSIS — J9600 Acute respiratory failure, unspecified whether with hypoxia or hypercapnia: Secondary | ICD-10-CM | POA: Diagnosis not present

## 2018-07-06 DIAGNOSIS — J9600 Acute respiratory failure, unspecified whether with hypoxia or hypercapnia: Secondary | ICD-10-CM | POA: Diagnosis not present

## 2018-08-06 DIAGNOSIS — J9600 Acute respiratory failure, unspecified whether with hypoxia or hypercapnia: Secondary | ICD-10-CM | POA: Diagnosis not present

## 2018-08-15 ENCOUNTER — Other Ambulatory Visit: Payer: Self-pay | Admitting: *Deleted

## 2018-08-15 MED ORDER — FERROUS FUMARATE 324 (106 FE) MG PO TABS: 1.0000 | ORAL_TABLET | Freq: Two times a day (BID) | ORAL | 0 refills | Status: DC

## 2018-09-05 DIAGNOSIS — J9600 Acute respiratory failure, unspecified whether with hypoxia or hypercapnia: Secondary | ICD-10-CM | POA: Diagnosis not present

## 2018-09-16 ENCOUNTER — Ambulatory Visit: Payer: BLUE CROSS/BLUE SHIELD | Admitting: Family Medicine

## 2018-09-16 ENCOUNTER — Encounter: Payer: Self-pay | Admitting: Family Medicine

## 2018-09-16 ENCOUNTER — Other Ambulatory Visit: Payer: Self-pay | Admitting: Family Medicine

## 2018-09-16 ENCOUNTER — Encounter (INDEPENDENT_AMBULATORY_CARE_PROVIDER_SITE_OTHER): Payer: Self-pay

## 2018-09-16 VITALS — BP 114/69 | HR 51 | Temp 96.8°F | Ht 64.0 in | Wt 223.4 lb

## 2018-09-16 DIAGNOSIS — D509 Iron deficiency anemia, unspecified: Secondary | ICD-10-CM

## 2018-09-16 DIAGNOSIS — D1722 Benign lipomatous neoplasm of skin and subcutaneous tissue of left arm: Secondary | ICD-10-CM | POA: Diagnosis not present

## 2018-09-16 DIAGNOSIS — E782 Mixed hyperlipidemia: Secondary | ICD-10-CM | POA: Diagnosis not present

## 2018-09-16 DIAGNOSIS — E039 Hypothyroidism, unspecified: Secondary | ICD-10-CM | POA: Diagnosis not present

## 2018-09-16 MED ORDER — LEVOTHYROXINE SODIUM 100 MCG PO TABS: 100.0000 ug | ORAL_TABLET | Freq: Every day | ORAL | 1 refills | Status: DC

## 2018-09-16 MED ORDER — TRAZODONE HCL 150 MG PO TABS: ORAL_TABLET | ORAL | 5 refills | Status: DC

## 2018-09-16 MED ORDER — ALENDRONATE SODIUM 70 MG PO TABS: 70.0000 mg | ORAL_TABLET | ORAL | 3 refills | Status: DC

## 2018-09-16 MED ORDER — ATORVASTATIN CALCIUM 40 MG PO TABS: 40.0000 mg | ORAL_TABLET | Freq: Every day | ORAL | 1 refills | Status: DC

## 2018-09-16 MED ORDER — FUROSEMIDE 40 MG PO TABS: 40.0000 mg | ORAL_TABLET | Freq: Every day | ORAL | 1 refills | Status: DC

## 2018-09-16 MED ORDER — FERROUS FUMARATE 324 (106 FE) MG PO TABS: 1.0000 | ORAL_TABLET | Freq: Two times a day (BID) | ORAL | 0 refills | Status: DC

## 2018-09-16 NOTE — Progress Notes (Signed)
Subjective:  Patient ID: Makayla Gibson, female    DOB: [birth year]  Age: 57 y.o. MRN: [id number]  CC: Hyperlipidemia (6 month follow up- Patient states she has a lump on her left arm that has been there for a year. )   HPI Makayla Gibson presents for patient presents for follow-up on  thyroid. The patient has a history of hypothyroidism for many years. It has been stable recently. Pt. denies any change in  voice, loss of hair, heat or cold intolerance. Energy level has been adequate to good. Patient denies constipation and diarrhea. No myxedema. Medication is as noted below. Verified that pt is taking it daily on an empty stomach. Well tolerated.  Patient in for follow-up of elevated cholesterol. Doing well without complaints on current medication. Denies side effects of statin including myalgia and arthralgia and nausea. Also in today for liver function testing. Currently no chest pain, shortness of breath or other cardiovascular related symptoms noted.  Patient also follow-up for anemia.  She has had some iron deficiency in the past.  Time for recheck.  She denies lack of energy sensation of being cold pallor or etc.  Patient also having over the last several months noticed a growth on the left arm it is freely movable under the skin she says it causes her discomfort she is just concerned that it is abnormal.  She has had infected cysts in her breast in the past and is worried that this could represent infectious processes.  Depression screen [facility] 2/9 09/16/2018 03/17/2018 09/16/2017  Decreased Interest 0 0 0  Down, Depressed, Hopeless 0 0 0  PHQ - 2 Score 0 0 0    History Makayla Gibson has a past medical history of Abscess of breast ([id number]), Anemia, Arthritis, Heart murmur, Hyperthyroidism, Osteoporosis, Sleep apnea, and Thyroid disease (11/2014).   She has a past surgical history that includes Cesarean section; Appendectomy; Incision and drainage abscess (Right, 07/29/2015); Breast  cyst excision (Right, 09/06/2016); Mastectomy, partial (Right, 09/06/2016); Breast lumpectomy (Left, 01/29/2018); Debridement and closure wound (Left, 01/29/2018); and Breast reduction surgery (Left, 01/29/2018).   Her family history includes Alcohol abuse in her father; Cancer in her brother; Heart disease in her brother and mother; Osteoporosis in her brother and mother; Rheumatic fever in her mother.She reports that she quit smoking about 9 years ago. She has never used smokeless tobacco. She reports previous alcohol use. She reports that she does not use drugs.    ROS Review of Systems  Constitutional: Negative.   HENT: Negative for congestion.   Eyes: Negative for visual disturbance.  Respiratory: Negative for shortness of breath.   Cardiovascular: Negative for chest pain.  Gastrointestinal: Negative for abdominal pain, constipation, diarrhea, nausea and vomiting.  Genitourinary: Negative for difficulty urinating.  Musculoskeletal: Negative for arthralgias and myalgias.  Neurological: Negative for headaches.  Psychiatric/Behavioral: Negative for sleep disturbance.    Objective:  BP 114/69   Pulse (!) 51   Temp (!) 96.8 F (36 C) (Oral)   Ht '5\' 4"'$  (1.626 m)   Wt 223 lb 6.4 oz (101.3 kg)   BMI 38.35 kg/m   BP Readings from Last 3 Encounters:  09/16/18 114/69  03/17/18 (!) 101/59  01/30/18 107/63    Wt Readings from Last 3 Encounters:  09/16/18 223 lb 6.4 oz (101.3 kg)  03/17/18 230 lb 4 oz (104.4 kg)  01/29/18 228 lb 9.9 oz (103.7 kg)     Physical Exam Constitutional:      General: She is  not in acute distress.    Appearance: She is well-developed.  HENT:     Head: Normocephalic and atraumatic.     Right Ear: External ear normal.     Left Ear: External ear normal.     Nose: Nose normal.  Eyes:     Conjunctiva/sclera: Conjunctivae normal.     Pupils: Pupils are equal, round, and reactive to light.  Neck:     Musculoskeletal: Normal range of motion and neck supple.       Thyroid: No thyromegaly.  Cardiovascular:     Rate and Rhythm: Normal rate and regular rhythm.     Heart sounds: Normal heart sounds. No murmur.  Pulmonary:     Effort: Pulmonary effort is normal. No respiratory distress.     Breath sounds: Normal breath sounds. No wheezing or rales.  Abdominal:     General: Bowel sounds are normal. There is no distension.     Palpations: Abdomen is soft.     Tenderness: There is no abdominal tenderness.  Lymphadenopathy:     Cervical: No cervical adenopathy.  Skin:    General: Skin is warm and dry.     Findings: Lesion (  4 cm freely movable rubbery lesion at the ventral aspect of the upper arm overlying the bicep.) present.  Neurological:     Mental Status: She is alert and oriented to person, place, and time.     Deep Tendon Reflexes: Reflexes are normal and symmetric.  Psychiatric:        Behavior: Behavior normal.        Thought Content: Thought content normal.        Judgment: Judgment normal.       Assessment & Plan:   Makayla Gibson was seen today for hyperlipidemia.  Diagnoses and all orders for this visit:  Hypothyroidism, unspecified type -     TSH -     T4, Free  Mixed hyperlipidemia -     CBC with Differential/Platelet -     CMP14+EGFR -     Lipid panel  Iron deficiency anemia, unspecified iron deficiency anemia type  Lipoma of left upper extremity  Other orders -     alendronate (FOSAMAX) 70 MG tablet; Take 1 tablet (70 mg total) by mouth every 7 (seven) days. Take with a full glass of water on an empty stomach. -     atorvastatin (LIPITOR) 40 MG tablet; Take 1 tablet (40 mg total) by mouth daily. Take for cholesterol control -     Ferrous Fumarate (FERROCITE) 324 (106 Fe) MG TABS tablet; Take 1 tablet (106 mg of iron total) by mouth 2 (two) times daily. -     furosemide (LASIX) 40 MG tablet; Take 1 tablet (40 mg total) by mouth daily. -     levothyroxine (SYNTHROID, LEVOTHROID) 100 MCG tablet; Take 1 tablet (100 mcg  total) by mouth daily.       I am having Makayla Gibson maintain her multivitamin with minerals, Naphazoline-Glycerin, acetaminophen, polyethylene glycol, meloxicam, potassium chloride, alendronate, atorvastatin, Ferrous Fumarate, furosemide, and levothyroxine.  Allergies as of 09/16/2018      Reactions   Keflex [cephalexin] Itching, Rash   Percocet [oxycodone-acetaminophen] Itching, Rash   Pt can take oxycodone and tylenol separately    Percodan [oxycodone-aspirin] Itching, Rash   Pt can take oxycodone and aspirin separately       Medication List       Accurate as of September 16, 2018 11:55 AM. Always use your most  recent med list.        acetaminophen 500 MG tablet Commonly known as:  TYLENOL Take 500-1,000 mg by mouth daily as needed for headache.   alendronate 70 MG tablet Commonly known as:  FOSAMAX Take 1 tablet (70 mg total) by mouth every 7 (seven) days. Take with a full glass of water on an empty stomach.   atorvastatin 40 MG tablet Commonly known as:  LIPITOR Take 1 tablet (40 mg total) by mouth daily. Take for cholesterol control   CLEAR EYES MAX REDNESS RELIEF 0.03-0.5 % Soln Generic drug:  Naphazoline-Glycerin Place 2 drops into both eyes 3 (three) times daily as needed (for redness/irritation.).   Ferrous Fumarate 324 (106 Fe) MG Tabs tablet Commonly known as:  FERROCITE Take 1 tablet (106 mg of iron total) by mouth 2 (two) times daily.   furosemide 40 MG tablet Commonly known as:  LASIX Take 1 tablet (40 mg total) by mouth daily.   levothyroxine 100 MCG tablet Commonly known as:  SYNTHROID, LEVOTHROID Take 1 tablet (100 mcg total) by mouth daily.   meloxicam 15 MG tablet Commonly known as:  MOBIC Take 1 tablet (15 mg total) by mouth daily with supper.   multivitamin with minerals Tabs tablet Take 1 tablet by mouth daily.   polyethylene glycol packet Commonly known as:  MIRALAX / GLYCOLAX Take 17 g by mouth daily as needed for mild  constipation.   potassium chloride 10 MEQ CR capsule Commonly known as:  MICRO-K TAKE 1 CAPSULE BY MOUTH EVERY DAY      Reassured regarding the lipoma.  However, it can be removed if it becomes a nuisance.  Follow-up: Return in about 6 months (around 03/18/2019).  Claretta Fraise, M.D.

## 2018-09-16 NOTE — Addendum Note (Signed)
Addended by: Claretta Fraise on: 09/16/2018 11:59 AM   Modules accepted: Orders

## 2018-09-17 ENCOUNTER — Other Ambulatory Visit: Payer: Self-pay | Admitting: *Deleted

## 2018-09-17 ENCOUNTER — Other Ambulatory Visit: Payer: Self-pay | Admitting: Family Medicine

## 2018-09-17 DIAGNOSIS — E039 Hypothyroidism, unspecified: Secondary | ICD-10-CM

## 2018-09-17 LAB — CMP14+EGFR
A/G RATIO: 2 (ref 1.2–2.2)
ALT: 32 IU/L (ref 0–32)
AST: 21 IU/L (ref 0–40)
Albumin: 4.2 g/dL (ref 3.5–5.5)
Alkaline Phosphatase: 52 IU/L (ref 39–117)
BILIRUBIN TOTAL: 0.4 mg/dL (ref 0.0–1.2)
BUN/Creatinine Ratio: 51 — ABNORMAL HIGH (ref 9–23)
BUN: 24 mg/dL (ref 6–24)
CHLORIDE: 102 mmol/L (ref 96–106)
CO2: 26 mmol/L (ref 20–29)
Calcium: 9.7 mg/dL (ref 8.7–10.2)
Creatinine, Ser: 0.47 mg/dL — ABNORMAL LOW (ref 0.57–1.00)
GFR calc non Af Amer: 111 mL/min/{1.73_m2} (ref >59)
GFR, EST AFRICAN AMERICAN: 128 mL/min/{1.73_m2} (ref >59)
GLOBULIN, TOTAL: 2.1 g/dL (ref 1.5–4.5)
Glucose: 111 mg/dL — ABNORMAL HIGH (ref 65–99)
POTASSIUM: 4.1 mmol/L (ref 3.5–5.2)
SODIUM: 141 mmol/L (ref 134–144)
Total Protein: 6.3 g/dL (ref 6.0–8.5)

## 2018-09-17 LAB — CBC WITH DIFFERENTIAL/PLATELET
BASOS ABS: 0.1 10*3/uL (ref 0.0–0.2)
Basos: 1 %
EOS (ABSOLUTE): 0.1 10*3/uL (ref 0.0–0.4)
Eos: 2 %
Hematocrit: 37.2 % (ref 34.0–46.6)
Hemoglobin: 12.9 g/dL (ref 11.1–15.9)
Immature Grans (Abs): 0 10*3/uL (ref 0.0–0.1)
Immature Granulocytes: 1 %
LYMPHS ABS: 2.5 10*3/uL (ref 0.7–3.1)
Lymphs: 28 %
MCH: 32 pg (ref 26.6–33.0)
MCHC: 34.7 g/dL (ref 31.5–35.7)
MCV: 92 fL (ref 79–97)
MONOS ABS: 0.7 10*3/uL (ref 0.1–0.9)
Monocytes: 8 %
Neutrophils Absolute: 5.5 10*3/uL (ref 1.4–7.0)
Neutrophils: 60 %
Platelets: 221 10*3/uL (ref 150–450)
RBC: 4.03 x10E6/uL (ref 3.77–5.28)
RDW: 12.4 % (ref 12.3–15.4)
WBC: 8.9 10*3/uL (ref 3.4–10.8)

## 2018-09-17 LAB — LIPID PANEL
CHOL/HDL RATIO: 3.7 ratio (ref 0.0–4.4)
Cholesterol, Total: 150 mg/dL (ref 100–199)
HDL: 41 mg/dL (ref >39)
LDL Calculated: 79 mg/dL (ref 0–99)
Triglycerides: 148 mg/dL (ref 0–149)
VLDL Cholesterol Cal: 30 mg/dL (ref 5–40)

## 2018-09-17 LAB — T4, FREE: FREE T4: 1.25 ng/dL (ref 0.82–1.77)

## 2018-09-17 LAB — TSH: TSH: 6.51 u[IU]/mL — AB (ref 0.450–4.500)

## 2018-09-17 MED ORDER — LEVOTHYROXINE SODIUM 112 MCG PO TABS: 112.0000 ug | ORAL_TABLET | Freq: Every day | ORAL | 1 refills | Status: DC

## 2018-10-06 DIAGNOSIS — J9600 Acute respiratory failure, unspecified whether with hypoxia or hypercapnia: Secondary | ICD-10-CM | POA: Diagnosis not present

## 2018-10-07 ENCOUNTER — Other Ambulatory Visit: Payer: Self-pay | Admitting: Family Medicine

## 2018-10-08 ENCOUNTER — Other Ambulatory Visit: Payer: Self-pay | Admitting: Family Medicine

## 2018-10-16 ENCOUNTER — Other Ambulatory Visit: Payer: Self-pay | Admitting: Family Medicine

## 2018-10-31 ENCOUNTER — Other Ambulatory Visit: Payer: BLUE CROSS/BLUE SHIELD

## 2018-10-31 DIAGNOSIS — E039 Hypothyroidism, unspecified: Secondary | ICD-10-CM | POA: Diagnosis not present

## 2018-11-01 LAB — TSH: TSH: 2.97 u[IU]/mL (ref 0.450–4.500)

## 2018-11-01 LAB — T4, FREE: Free T4: 1.43 ng/dL (ref 0.82–1.77)

## 2018-11-05 ENCOUNTER — Ambulatory Visit (INDEPENDENT_AMBULATORY_CARE_PROVIDER_SITE_OTHER): Payer: BLUE CROSS/BLUE SHIELD | Admitting: Family Medicine

## 2018-11-05 ENCOUNTER — Encounter: Payer: Self-pay | Admitting: Family Medicine

## 2018-11-05 VITALS — BP 107/58 | HR 43 | Temp 97.5°F | Ht 64.0 in | Wt 220.1 lb

## 2018-11-05 DIAGNOSIS — E039 Hypothyroidism, unspecified: Secondary | ICD-10-CM | POA: Diagnosis not present

## 2018-11-05 NOTE — Progress Notes (Signed)
Subjective:  Patient ID: Makayla Gibson, female    DOB: August 21, 1961  Age: 58 y.o. MRN: [id number]  CC: Hypothyroidism (pt here today for 8 week recheck of her thyroid since increasing levothyroxine)   HPI [name] presents for patient presents for follow-up on  thyroid. The patient has a history of hypothyroidism for many years. Dose was adjusted upward recently. Pt. denies any change in  voice, loss of hair, heat or cold intolerance. Energy level has improved since making the change. Patient denies constipation and diarrhea.Medication is as noted below. Verified that pt is taking it daily on an empty stomach. Well tolerated.  Depression screen [facility] 2/9 11/05/2018 09/16/2018 03/17/2018  Decreased Interest 0 0 0  Down, Depressed, Hopeless 0 0 0  PHQ - 2 Score 0 0 0    History Makayla Gibson has a past medical history of Abscess of breast ([id number]), Anemia, Arthritis, Heart murmur, Hyperthyroidism, Osteoporosis, Sleep apnea, and Thyroid disease (11/2014).   She has a past surgical history that includes Cesarean section; Appendectomy; Incision and drainage abscess (Right, 07/29/2015); Breast cyst excision (Right, 09/06/2016); Mastectomy, partial (Right, 09/06/2016); Breast lumpectomy (Left, 01/29/2018); Debridement and closure wound (Left, 01/29/2018); and Breast reduction surgery (Left, 01/29/2018).   Her family history includes Alcohol abuse in her father; Cancer in her brother; Heart disease in her brother and mother; Osteoporosis in her brother and mother; Rheumatic fever in her mother.She reports that she quit smoking about 10 years ago. She has never used smokeless tobacco. She reports previous alcohol use. She reports that she does not use drugs.    ROS Review of Systems  Constitutional: Negative.   HENT: Negative for congestion.   Eyes: Negative for visual disturbance.  Respiratory: Negative for shortness of breath.   Cardiovascular: Negative for chest pain.  Gastrointestinal:  Negative for abdominal pain, constipation, diarrhea, nausea and vomiting.  Genitourinary: Negative for difficulty urinating.  Musculoskeletal: Negative for arthralgias and myalgias.  Neurological: Negative for headaches.  Psychiatric/Behavioral: Negative for sleep disturbance.    Objective:  BP (!) 107/58   Pulse (!) 43   Temp (!) 97.5 F (36.4 C) (Oral)   Ht 5\' 4"  (1.626 m)   Wt 220 lb 2 oz (99.8 kg)   BMI 37.78 kg/m   BP Readings from Last 3 Encounters:  11/05/18 (!) 107/58  09/16/18 114/69  03/17/18 (!) 101/59    Wt Readings from Last 3 Encounters:  11/05/18 220 lb 2 oz (99.8 kg)  09/16/18 223 lb 6.4 oz (101.3 kg)  03/17/18 230 lb 4 oz (104.4 kg)     Physical Exam Constitutional:      General: She is not in acute distress.    Appearance: She is well-developed.  HENT:     Head: Normocephalic and atraumatic.  Eyes:     Conjunctiva/sclera: Conjunctivae normal.     Pupils: Pupils are equal, round, and reactive to light.  Neck:     Musculoskeletal: Normal range of motion.  Cardiovascular:     Rate and Rhythm: Normal rate and regular rhythm.     Heart sounds: Normal heart sounds. No murmur.  Pulmonary:     Effort: Pulmonary effort is normal. No respiratory distress.     Breath sounds: Normal breath sounds. No wheezing or rales.  Abdominal:     Palpations: Abdomen is soft.     Tenderness: There is no abdominal tenderness.  Musculoskeletal: Normal range of motion.  Skin:    General: Skin is warm and dry.  Neurological:  Mental Status: She is alert and oriented to person, place, and time.  Psychiatric:        Behavior: Behavior normal.       Assessment & Plan:   Locklyn was seen today for hypothyroidism.  Diagnoses and all orders for this visit:  Hypothyroidism, unspecified type       I am having [name]. Lapinski maintain her multivitamin with minerals, Naphazoline-Glycerin, acetaminophen, polyethylene glycol, alendronate, atorvastatin,  Ferrous Fumarate, furosemide, levothyroxine, potassium chloride, traZODone, and meloxicam.  Allergies as of 11/05/2018      Reactions   Keflex [cephalexin] Itching, Rash   Percocet [oxycodone-acetaminophen] Itching, Rash   Pt can take oxycodone and tylenol separately    Percodan [oxycodone-aspirin] Itching, Rash   Pt can take oxycodone and aspirin separately       Medication List       Accurate as of November 05, 2018 11:59 PM. Always use your most recent med list.        acetaminophen 500 MG tablet Commonly known as:  TYLENOL Take 500-1,000 mg by mouth daily as needed for headache.   alendronate 70 MG tablet Commonly known as:  FOSAMAX Take 1 tablet (70 mg total) by mouth every 7 (seven) days. Take with a full glass of water on an empty stomach.   atorvastatin 40 MG tablet Commonly known as:  LIPITOR Take 1 tablet (40 mg total) by mouth daily. Take for cholesterol control   CLEAR EYES MAX REDNESS RELIEF 0.03-0.5 % Soln Generic drug:  Naphazoline-Glycerin Place 2 drops into both eyes 3 (three) times daily as needed (for redness/irritation.).   Ferrous Fumarate 324 (106 Fe) MG Tabs tablet Commonly known as:  FERROCITE Take 1 tablet (106 mg of iron total) by mouth 2 (two) times daily.   furosemide 40 MG tablet Commonly known as:  LASIX Take 1 tablet (40 mg total) by mouth daily.   levothyroxine 112 MCG tablet Commonly known as:  SYNTHROID, LEVOTHROID Take 1 tablet (112 mcg total) by mouth daily.   meloxicam 15 MG tablet Commonly known as:  MOBIC TAKE 1 TABLET (15 MG TOTAL) BY MOUTH DAILY WITH SUPPER.   multivitamin with minerals Tabs tablet Take 1 tablet by mouth daily.   polyethylene glycol packet Commonly known as:  MIRALAX / GLYCOLAX Take 17 g by mouth daily as needed for mild constipation.   potassium chloride 10 MEQ CR capsule Commonly known as:  MICRO-K TAKE 1 CAPSULE BY MOUTH EVERY DAY   traZODone 150 MG tablet Commonly known as:  DESYREL USE FROM 1/3  TO 1 TABLET NIGHTLY AS NEEDED FOR SLEEP.        Follow-up: Return in about 3 months (around 02/03/2019) for Hypothyroidism.  Claretta Fraise, M.D.

## 2018-11-05 NOTE — Patient Instructions (Signed)
DASH Eating Plan  DASH stands for "Dietary Approaches to Stop Hypertension." The DASH eating plan is a healthy eating plan that has been shown to reduce high blood pressure (hypertension). It may also reduce your risk for type 2 diabetes, heart disease, and stroke. The DASH eating plan may also help with weight loss.  What are tips for following this plan?    General guidelines   Avoid eating more than 2,300 mg (milligrams) of salt (sodium) a day. If you have hypertension, you may need to reduce your sodium intake to 1,500 mg a day.   Limit alcohol intake to no more than 1 drink a day for nonpregnant women and 2 drinks a day for men. One drink equals 12 oz of beer, 5 oz of wine, or 1 oz of hard liquor.   Work with your health care provider to maintain a healthy body weight or to lose weight. Ask what an ideal weight is for you.   Get at least 30 minutes of exercise that causes your heart to beat faster (aerobic exercise) most days of the week. Activities may include walking, swimming, or biking.   Work with your health care provider or diet and nutrition specialist (dietitian) to adjust your eating plan to your individual calorie needs.  Reading food labels     Check food labels for the amount of sodium per serving. Choose foods with less than 5 percent of the Daily Value of sodium. Generally, foods with less than 300 mg of sodium per serving fit into this eating plan.   To find whole grains, look for the word "whole" as the first word in the ingredient list.  Shopping   Buy products labeled as "low-sodium" or "no salt added."   Buy fresh foods. Avoid canned foods and premade or frozen meals.  Cooking   Avoid adding salt when cooking. Use salt-free seasonings or herbs instead of table salt or sea salt. Check with your health care provider or pharmacist before using salt substitutes.   Do not fry foods. Cook foods using healthy methods such as baking, boiling, grilling, and broiling instead.   Cook with  heart-healthy oils, such as olive, canola, soybean, or sunflower oil.  Meal planning   Eat a balanced diet that includes:  ? 5 or more servings of fruits and vegetables each day. At each meal, try to fill half of your plate with fruits and vegetables.  ? Up to 6-8 servings of whole grains each day.  ? Less than 6 oz of lean meat, poultry, or fish each day. A 3-oz serving of meat is about the same size as a deck of cards. One egg equals 1 oz.  ? 2 servings of low-fat dairy each day.  ? A serving of nuts, seeds, or beans 5 times each week.  ? Heart-healthy fats. Healthy fats called Omega-3 fatty acids are found in foods such as flaxseeds and coldwater fish, like sardines, salmon, and mackerel.   Limit how much you eat of the following:  ? Canned or prepackaged foods.  ? Food that is high in trans fat, such as fried foods.  ? Food that is high in saturated fat, such as fatty meat.  ? Sweets, desserts, sugary drinks, and other foods with added sugar.  ? Full-fat dairy products.   Do not salt foods before eating.   Try to eat at least 2 vegetarian meals each week.   Eat more home-cooked food and less restaurant, buffet, and fast food.     When eating at a restaurant, ask that your food be prepared with less salt or no salt, if possible.  What foods are recommended?  The items listed may not be a complete list. Talk with your dietitian about what dietary choices are best for you.  Grains  Whole-grain or whole-wheat bread. Whole-grain or whole-wheat pasta. Brown rice. Oatmeal. Quinoa. Bulgur. Whole-grain and low-sodium cereals. Pita bread. Low-fat, low-sodium crackers. Whole-wheat flour tortillas.  Vegetables  Fresh or frozen vegetables (raw, steamed, roasted, or grilled). Low-sodium or reduced-sodium tomato and vegetable juice. Low-sodium or reduced-sodium tomato sauce and tomato paste. Low-sodium or reduced-sodium canned vegetables.  Fruits  All fresh, dried, or frozen fruit. Canned fruit in natural juice (without  added sugar).  Meat and other protein foods  Skinless chicken or turkey. Ground chicken or turkey. Pork with fat trimmed off. Fish and seafood. Egg whites. Dried beans, peas, or lentils. Unsalted nuts, nut butters, and seeds. Unsalted canned beans. Lean cuts of beef with fat trimmed off. Low-sodium, lean deli meat.  Dairy  Low-fat (1%) or fat-free (skim) milk. Fat-free, low-fat, or reduced-fat cheeses. Nonfat, low-sodium ricotta or cottage cheese. Low-fat or nonfat yogurt. Low-fat, low-sodium cheese.  Fats and oils  Soft margarine without trans fats. Vegetable oil. Low-fat, reduced-fat, or light mayonnaise and salad dressings (reduced-sodium). Canola, safflower, olive, soybean, and sunflower oils. Avocado.  Seasoning and other foods  Herbs. Spices. Seasoning mixes without salt. Unsalted popcorn and pretzels. Fat-free sweets.  What foods are not recommended?  The items listed may not be a complete list. Talk with your dietitian about what dietary choices are best for you.  Grains  Baked goods made with fat, such as croissants, muffins, or some breads. Dry pasta or rice meal packs.  Vegetables  Creamed or fried vegetables. Vegetables in a cheese sauce. Regular canned vegetables (not low-sodium or reduced-sodium). Regular canned tomato sauce and paste (not low-sodium or reduced-sodium). Regular tomato and vegetable juice (not low-sodium or reduced-sodium). Pickles. Olives.  Fruits  Canned fruit in a light or heavy syrup. Fried fruit. Fruit in cream or butter sauce.  Meat and other protein foods  Fatty cuts of meat. Ribs. Fried meat. Bacon. Sausage. Bologna and other processed lunch meats. Salami. Fatback. Hotdogs. Bratwurst. Salted nuts and seeds. Canned beans with added salt. Canned or smoked fish. Whole eggs or egg yolks. Chicken or turkey with skin.  Dairy  Whole or 2% milk, cream, and half-and-half. Whole or full-fat cream cheese. Whole-fat or sweetened yogurt. Full-fat cheese. Nondairy creamers. Whipped toppings.  Processed cheese and cheese spreads.  Fats and oils  Butter. Stick margarine. Lard. Shortening. Ghee. Bacon fat. Tropical oils, such as coconut, palm kernel, or palm oil.  Seasoning and other foods  Salted popcorn and pretzels. Onion salt, garlic salt, seasoned salt, table salt, and sea salt. Worcestershire sauce. Tartar sauce. Barbecue sauce. Teriyaki sauce. Soy sauce, including reduced-sodium. Steak sauce. Canned and packaged gravies. Fish sauce. Oyster sauce. Cocktail sauce. Horseradish that you find on the shelf. Ketchup. Mustard. Meat flavorings and tenderizers. Bouillon cubes. Hot sauce and Tabasco sauce. Premade or packaged marinades. Premade or packaged taco seasonings. Relishes. Regular salad dressings.  Where to find more information:   National Heart, Lung, and Blood Institute: www.nhlbi.nih.gov   American Heart Association: www.heart.org  Summary   The DASH eating plan is a healthy eating plan that has been shown to reduce high blood pressure (hypertension). It may also reduce your risk for type 2 diabetes, heart disease, and stroke.   With the   DASH eating plan, you should limit salt (sodium) intake to 2,300 mg a day. If you have hypertension, you may need to reduce your sodium intake to 1,500 mg a day.   When on the DASH eating plan, aim to eat more fresh fruits and vegetables, whole grains, lean proteins, low-fat dairy, and heart-healthy fats.   Work with your health care provider or diet and nutrition specialist (dietitian) to adjust your eating plan to your individual calorie needs.  This information is not intended to replace advice given to you by your health care provider. Make sure you discuss any questions you have with your health care provider.  Document Released: 09/06/2011 Document Revised: 09/10/2016 Document Reviewed: 09/10/2016  Elsevier Interactive Patient Education  2019 [employer]

## 2018-11-06 DIAGNOSIS — J9600 Acute respiratory failure, unspecified whether with hypoxia or hypercapnia: Secondary | ICD-10-CM | POA: Diagnosis not present

## 2018-11-09 ENCOUNTER — Encounter: Payer: Self-pay | Admitting: Family Medicine

## 2018-12-05 DIAGNOSIS — J9600 Acute respiratory failure, unspecified whether with hypoxia or hypercapnia: Secondary | ICD-10-CM | POA: Diagnosis not present

## 2019-01-05 DIAGNOSIS — J9600 Acute respiratory failure, unspecified whether with hypoxia or hypercapnia: Secondary | ICD-10-CM | POA: Diagnosis not present

## 2019-01-07 ENCOUNTER — Other Ambulatory Visit: Payer: Self-pay | Admitting: Family Medicine

## 2019-01-28 ENCOUNTER — Other Ambulatory Visit: Payer: Self-pay | Admitting: Family Medicine

## 2019-02-04 DIAGNOSIS — J9600 Acute respiratory failure, unspecified whether with hypoxia or hypercapnia: Secondary | ICD-10-CM | POA: Diagnosis not present

## 2019-02-12 ENCOUNTER — Telehealth: Payer: Self-pay | Admitting: Family Medicine

## 2019-03-05 ENCOUNTER — Other Ambulatory Visit: Payer: Self-pay | Admitting: Family Medicine

## 2019-03-07 DIAGNOSIS — J9600 Acute respiratory failure, unspecified whether with hypoxia or hypercapnia: Secondary | ICD-10-CM | POA: Diagnosis not present

## 2019-03-16 ENCOUNTER — Other Ambulatory Visit: Payer: Self-pay | Admitting: Family Medicine

## 2019-03-17 ENCOUNTER — Other Ambulatory Visit: Payer: Self-pay

## 2019-03-17 ENCOUNTER — Telehealth: Payer: Self-pay | Admitting: Family Medicine

## 2019-03-18 ENCOUNTER — Encounter: Payer: Self-pay | Admitting: Family Medicine

## 2019-03-18 ENCOUNTER — Ambulatory Visit: Payer: BLUE CROSS/BLUE SHIELD | Admitting: Family Medicine

## 2019-03-18 VITALS — BP 106/64 | HR 45 | Temp 97.2°F | Ht 64.0 in | Wt 220.2 lb

## 2019-03-18 DIAGNOSIS — E039 Hypothyroidism, unspecified: Secondary | ICD-10-CM | POA: Diagnosis not present

## 2019-03-18 DIAGNOSIS — E782 Mixed hyperlipidemia: Secondary | ICD-10-CM | POA: Diagnosis not present

## 2019-03-18 DIAGNOSIS — N6002 Solitary cyst of left breast: Secondary | ICD-10-CM | POA: Diagnosis not present

## 2019-03-18 LAB — URINALYSIS
Bilirubin, UA: NEGATIVE
Glucose, UA: NEGATIVE
Leukocytes,UA: NEGATIVE
Nitrite, UA: NEGATIVE
Specific Gravity, UA: 1.025 (ref 1.005–1.030)
Urobilinogen, Ur: 0.2 mg/dL (ref 0.2–1.0)
pH, UA: 5.5 (ref 5.0–7.5)

## 2019-03-18 MED ORDER — AMOXICILLIN-POT CLAVULANATE 875-125 MG PO TABS: 1.0000 | ORAL_TABLET | Freq: Two times a day (BID) | ORAL | 0 refills | Status: DC

## 2019-03-18 MED ORDER — POTASSIUM CHLORIDE ER 10 MEQ PO CPCR: 10.0000 meq | ORAL_CAPSULE | Freq: Every day | ORAL | 1 refills | Status: DC

## 2019-03-18 MED ORDER — ATORVASTATIN CALCIUM 40 MG PO TABS: 40.0000 mg | ORAL_TABLET | Freq: Every day | ORAL | 1 refills | Status: DC

## 2019-03-18 MED ORDER — FERROUS FUMARATE 324 (106 FE) MG PO TABS: 1.0000 | ORAL_TABLET | Freq: Every day | ORAL | 1 refills | Status: DC

## 2019-03-18 MED ORDER — FUROSEMIDE 40 MG PO TABS: 40.0000 mg | ORAL_TABLET | Freq: Every day | ORAL | 1 refills | Status: DC

## 2019-03-18 NOTE — Progress Notes (Signed)
Subjective:  Patient ID: Makayla Gibson, female    DOB: 02-22-1961  Age: 58 y.o. MRN: [id number]  CC: knot in left breasts and Medical Management of Chronic Issues (six month recheck)   HPI Makayla Gibson presents for patient presents for follow-up on  thyroid. The patient has a history of hypothyroidism for many years. It has been stable recently. Pt. denies any change in  voice, loss of hair, heat or cold intolerance. Energy level has been adequate to good. Patient denies constipation and diarrhea. No myxedema. Medication is as noted below. Verified that pt is taking it daily on an empty stomach. Well tolerated.  Patient also reports that a cyst is been enlarging on the left breast it is very painful.  Yesterday she put a needle in it to force it open and it popped open and copious amount of purulence came out she wants it checked today because it still very tender.  Patient in for follow-up of elevated cholesterol. Doing well without complaints on current medication. Denies side effects of statin including myalgia and arthralgia and nausea. Also in today for liver function testing. Currently no chest pain, shortness of breath or other cardiovascular related symptoms noted.  Depression screen [facility] 2/9 03/18/2019 11/05/2018 09/16/2018  Decreased Interest 0 0 0  Down, Depressed, Hopeless 0 0 0  PHQ - 2 Score 0 0 0    History Makayla Gibson has a past medical history of Abscess of breast ([id number]), Anemia, Arthritis, Heart murmur, Hyperthyroidism, Osteoporosis, Sleep apnea, and Thyroid disease (11/2014).   She has a past surgical history that includes Cesarean section; Appendectomy; Incision and drainage abscess (Right, 07/29/2015); Breast cyst excision (Right, 09/06/2016); Mastectomy, partial (Right, 09/06/2016); Breast lumpectomy (Left, 01/29/2018); Debridement and closure wound (Left, 01/29/2018); and Breast reduction surgery (Left, 01/29/2018).   Her family history includes Alcohol abuse in her  father; Cancer in her brother; Heart disease in her brother and mother; Osteoporosis in her brother and mother; Rheumatic fever in her mother.She reports that she quit smoking about 10 years ago. She has never used smokeless tobacco. She reports previous alcohol use. She reports that she does not use drugs.    ROS Review of Systems  Constitutional: Negative.   HENT: Negative for congestion.   Eyes: Negative for visual disturbance.  Respiratory: Negative for shortness of breath.   Cardiovascular: Negative for chest pain.  Gastrointestinal: Negative for abdominal pain, constipation, diarrhea, nausea and vomiting.  Genitourinary: Negative for difficulty urinating.  Musculoskeletal: Negative for arthralgias and myalgias.  Skin: Positive for wound (The cyst at the lateral upper quadrant of the left breast is tender.).  Neurological: Negative for headaches.  Psychiatric/Behavioral: Negative for sleep disturbance.    Objective:  BP 106/64   Pulse (!) 45   Temp (!) 97.2 F (36.2 C) (Oral)   Ht 5\' 4"  (1.626 m)   Wt 220 lb 3.2 oz (99.9 kg)   BMI 37.80 kg/m   BP Readings from Last 3 Encounters:  03/18/19 106/64  11/05/18 (!) 107/58  09/16/18 114/69    Wt Readings from Last 3 Encounters:  03/18/19 220 lb 3.2 oz (99.9 kg)  11/05/18 220 lb 2 oz (99.8 kg)  09/16/18 223 lb 6.4 oz (101.3 kg)     Physical Exam Constitutional:      General: She is not in acute distress.    Appearance: She is well-developed.  HENT:     Head: Normocephalic and atraumatic.     Right Ear: External ear normal.  Left Ear: External ear normal.     Nose: Nose normal.  Eyes:     Conjunctiva/sclera: Conjunctivae normal.     Pupils: Pupils are equal, round, and reactive to light.  Neck:     Musculoskeletal: Normal range of motion and neck supple.     Thyroid: No thyromegaly.  Cardiovascular:     Rate and Rhythm: Normal rate and regular rhythm.     Heart sounds: Normal heart sounds. No murmur.   Pulmonary:     Effort: Pulmonary effort is normal. No respiratory distress.     Breath sounds: Normal breath sounds. No wheezing or rales.  Abdominal:     General: Bowel sounds are normal. There is no distension.     Palpations: Abdomen is soft.     Tenderness: There is no abdominal tenderness.  Genitourinary:    General: Normal vulva.  Musculoskeletal: Normal range of motion.  Lymphadenopathy:     Cervical: No cervical adenopathy.  Skin:    General: Skin is warm and dry.     Findings: Lesion (There is a 2 x 3 area margin of erythema around a bleb at the left upper outer quadrant of the breast.  This represents the abscess that drained.  There is currently no fluctuance.  However it is quite tender to palpation.) present.  Neurological:     Mental Status: She is alert and oriented to person, place, and time.     Deep Tendon Reflexes: Reflexes are normal and symmetric.  Psychiatric:        Behavior: Behavior normal.        Thought Content: Thought content normal.        Judgment: Judgment normal.       Assessment & Plan:   Makayla Gibson was seen today for knot in left breasts and medical management of chronic issues.  Diagnoses and all orders for this visit:  Hypothyroidism, unspecified type -     Lipid panel -     Urinalysis -     TSH -     T4, free; Future  Mixed hyperlipidemia -     Lipid panel  Cyst of left breast  Other orders -     atorvastatin (LIPITOR) 40 MG tablet; Take 1 tablet (40 mg total) by mouth daily. Take for cholesterol control -     Ferrous Fumarate (FERROCITE) 324 (106 Fe) MG TABS tablet; Take 1 tablet (106 mg of iron total) by mouth daily. -     furosemide (LASIX) 40 MG tablet; Take 1 tablet (40 mg total) by mouth daily. -     potassium chloride (MICRO-K) 10 MEQ CR capsule; Take 1 capsule (10 mEq total) by mouth daily. -     amoxicillin-clavulanate (AUGMENTIN) 875-125 MG tablet; Take 1 tablet by mouth 2 (two) times daily. Take all of this medication        I have discontinued Makayla Gibson. Makayla Gibson's traZODone. I have changed her Ferrocite to Ferrous Fumarate. I have also changed her potassium chloride. Additionally, I am having her start on amoxicillin-clavulanate. Lastly, I am having her maintain her multivitamin with minerals, Naphazoline-Glycerin, acetaminophen, polyethylene glycol, alendronate, meloxicam, levothyroxine, atorvastatin, and furosemide.  Allergies as of 03/18/2019      Reactions   Keflex [cephalexin] Itching, Rash   Percocet [oxycodone-acetaminophen] Itching, Rash   Pt can take oxycodone and tylenol separately    Percodan [oxycodone-aspirin] Itching, Rash   Pt can take oxycodone and aspirin separately       Medication List  Accurate as of March 18, 2019 12:02 PM. If you have any questions, ask your nurse or doctor.        STOP taking these medications   traZODone 150 MG tablet Commonly known as: DESYREL Stopped by: Claretta Fraise, MD     TAKE these medications   acetaminophen 500 MG tablet Commonly known as: TYLENOL Take 500-1,000 mg by mouth daily as needed for headache.   alendronate 70 MG tablet Commonly known as: FOSAMAX Take 1 tablet (70 mg total) by mouth every 7 (seven) days. Take with a full glass of water on an empty stomach.   amoxicillin-clavulanate 875-125 MG tablet Commonly known as: AUGMENTIN Take 1 tablet by mouth 2 (two) times daily. Take all of this medication Started by: Claretta Fraise, MD   atorvastatin 40 MG tablet Commonly known as: LIPITOR Take 1 tablet (40 mg total) by mouth daily. Take for cholesterol control   Clear Eyes Max Redness Relief 0.03-0.5 % Soln Generic drug: Naphazoline-Glycerin Place 2 drops into both eyes 3 (three) times daily as needed (for redness/irritation.).   Ferrous Fumarate 324 (106 Fe) MG Tabs tablet Commonly known as: Ferrocite Take 1 tablet (106 mg of iron total) by mouth daily. What changed:   medication strength  See the new instructions.  Changed by: Claretta Fraise, MD   furosemide 40 MG tablet Commonly known as: LASIX Take 1 tablet (40 mg total) by mouth daily.   levothyroxine 112 MCG tablet Commonly known as: SYNTHROID TAKE 1 TABLET BY MOUTH EVERY DAY   meloxicam 15 MG tablet Commonly known as: MOBIC TAKE 1 TABLET (15 MG TOTAL) BY MOUTH DAILY WITH SUPPER.   multivitamin with minerals Tabs tablet Take 1 tablet by mouth daily.   polyethylene glycol 17 g packet Commonly known as: MIRALAX / GLYCOLAX Take 17 g by mouth daily as needed for mild constipation.   potassium chloride 10 MEQ CR capsule Commonly known as: MICRO-K Take 1 capsule (10 mEq total) by mouth daily. What changed: how much to take Changed by: Claretta Fraise, MD        Follow-up: Return in about 6 months (around 09/17/2019).  Claretta Fraise, M.D.

## 2019-03-19 LAB — LIPID PANEL
Chol/HDL Ratio: 4.3 ratio (ref 0.0–4.4)
Cholesterol, Total: 154 mg/dL (ref 100–199)
HDL: 36 mg/dL — ABNORMAL LOW (ref >39)
LDL Calculated: 72 mg/dL (ref 0–99)
Triglycerides: 231 mg/dL — ABNORMAL HIGH (ref 0–149)
VLDL Cholesterol Cal: 46 mg/dL — ABNORMAL HIGH (ref 5–40)

## 2019-03-19 LAB — TSH: TSH: 5.78 u[IU]/mL — ABNORMAL HIGH (ref 0.450–4.500)

## 2019-03-23 ENCOUNTER — Other Ambulatory Visit: Payer: Self-pay | Admitting: *Deleted

## 2019-03-23 ENCOUNTER — Other Ambulatory Visit: Payer: Self-pay | Admitting: Family Medicine

## 2019-03-23 DIAGNOSIS — E039 Hypothyroidism, unspecified: Secondary | ICD-10-CM

## 2019-03-23 MED ORDER — LEVOTHYROXINE SODIUM 125 MCG PO TABS: 125.0000 ug | ORAL_TABLET | Freq: Every day | ORAL | 2 refills | Status: DC

## 2019-03-30 ENCOUNTER — Other Ambulatory Visit: Payer: Self-pay | Admitting: Family

## 2019-04-01 ENCOUNTER — Other Ambulatory Visit: Payer: Self-pay | Admitting: Family Medicine

## 2019-04-01 NOTE — Telephone Encounter (Signed)
03/18/19-Please advise

## 2019-04-06 DIAGNOSIS — J9600 Acute respiratory failure, unspecified whether with hypoxia or hypercapnia: Secondary | ICD-10-CM | POA: Diagnosis not present

## 2019-04-29 ENCOUNTER — Other Ambulatory Visit: Payer: Self-pay | Admitting: Family Medicine

## 2019-05-05 ENCOUNTER — Other Ambulatory Visit: Payer: Self-pay

## 2019-05-06 ENCOUNTER — Other Ambulatory Visit: Payer: BC Managed Care – PPO

## 2019-05-06 DIAGNOSIS — E039 Hypothyroidism, unspecified: Secondary | ICD-10-CM

## 2019-05-07 DIAGNOSIS — J9600 Acute respiratory failure, unspecified whether with hypoxia or hypercapnia: Secondary | ICD-10-CM | POA: Diagnosis not present

## 2019-05-07 LAB — TSH: TSH: 3.68 u[IU]/mL (ref 0.450–4.500)

## 2019-05-07 LAB — T4, FREE: Free T4: 1.44 ng/dL (ref 0.82–1.77)

## 2019-05-08 ENCOUNTER — Other Ambulatory Visit: Payer: Self-pay

## 2019-05-11 ENCOUNTER — Encounter: Payer: Self-pay | Admitting: Family Medicine

## 2019-05-11 ENCOUNTER — Ambulatory Visit: Payer: BC Managed Care – PPO | Admitting: Family Medicine

## 2019-05-11 ENCOUNTER — Other Ambulatory Visit: Payer: Self-pay

## 2019-05-11 VITALS — BP 90/56 | HR 44 | Temp 97.5°F | Ht 64.0 in | Wt 224.0 lb

## 2019-05-11 DIAGNOSIS — R001 Bradycardia, unspecified: Secondary | ICD-10-CM

## 2019-05-11 DIAGNOSIS — E038 Other specified hypothyroidism: Secondary | ICD-10-CM | POA: Diagnosis not present

## 2019-05-11 NOTE — Progress Notes (Signed)
Subjective:  Patient ID: Makayla Gibson, female    DOB: 08-18-1961  Age: 58 y.o. MRN: [id number]  CC: Hypothyroidism   HPI Makayla Gibson presents for thyroid recheck after recent dose increase. Feels good. Denies lack of energy, diarrhea, constipation, as well as no heat and cold intolerance.    Depression screen [facility] 2/9 05/11/2019 03/18/2019 11/05/2018  Decreased Interest 0 0 0  Down, Depressed, Hopeless 0 0 0  PHQ - 2 Score 0 0 0  Altered sleeping 0 - -  Tired, decreased energy 0 - -  Change in appetite 0 - -  Feeling bad or failure about yourself  0 - -  Trouble concentrating 0 - -  Moving slowly or fidgety/restless 0 - -  Suicidal thoughts 0 - -  PHQ-9 Score 0 - -    History Makayla Gibson has a past medical history of Abscess of breast ([id number]), Anemia, Arthritis, Heart murmur, Hyperthyroidism, Osteoporosis, Sleep apnea, and Thyroid disease (11/2014).   She has a past surgical history that includes Cesarean section; Appendectomy; Incision and drainage abscess (Right, 07/29/2015); Breast cyst excision (Right, 09/06/2016); Mastectomy, partial (Right, 09/06/2016); Breast lumpectomy (Left, 01/29/2018); Debridement and closure wound (Left, 01/29/2018); and Breast reduction surgery (Left, 01/29/2018).   Her family history includes Alcohol abuse in her father; Cancer in her brother; Heart disease in her brother and mother; Osteoporosis in her brother and mother; Rheumatic fever in her mother.She reports that she quit smoking about 10 years ago. She has never used smokeless tobacco. She reports previous alcohol use. She reports that she does not use drugs.    ROS Review of Systems  Constitutional: Negative.   HENT: Negative.   Eyes: Negative for visual disturbance.  Respiratory: Negative for shortness of breath.   Cardiovascular: Negative for chest pain.  Gastrointestinal: Negative for abdominal pain.  Musculoskeletal: Negative for arthralgias.    Objective:  BP (!) 90/56    Pulse (!) 44   Temp (!) 97.5 F (36.4 C) (Temporal)   Ht 5\' 4"  (1.626 m)   Wt 224 lb (101.6 kg)   BMI 38.45 kg/m   BP Readings from Last 3 Encounters:  05/11/19 (!) 90/56  03/18/19 106/64  11/05/18 (!) 107/58    Wt Readings from Last 3 Encounters:  05/11/19 224 lb (101.6 kg)  03/18/19 220 lb 3.2 oz (99.9 kg)  11/05/18 220 lb 2 oz (99.8 kg)     Physical Exam Constitutional:      General: She is not in acute distress.    Appearance: She is well-developed.  Neck:     Musculoskeletal: Normal range of motion.     Thyroid: No thyroid mass, thyromegaly or thyroid tenderness.  Cardiovascular:     Rate and Rhythm: Normal rate and regular rhythm.  Pulmonary:     Breath sounds: Normal breath sounds.  Lymphadenopathy:     Cervical: No cervical adenopathy.  Skin:    General: Skin is warm and dry.  Neurological:     Mental Status: She is alert and oriented to person, place, and time.       Assessment & Plan:   Makayla Gibson was seen today for hypothyroidism.  Diagnoses and all orders for this visit:  Other specified hypothyroidism  Bradycardia       I have discontinued Makayla Gibson. Makayla Gibson's amoxicillin-clavulanate and traZODone. I am also having her maintain her multivitamin with minerals, Naphazoline-Glycerin, acetaminophen, polyethylene glycol, alendronate, atorvastatin, Ferrous Fumarate, furosemide, potassium chloride, levothyroxine, and meloxicam.  Allergies as of 05/11/2019  Reactions   Keflex [cephalexin] Itching, Rash   Percocet [oxycodone-acetaminophen] Itching, Rash   Pt can take oxycodone and tylenol separately    Percodan [oxycodone-aspirin] Itching, Rash   Pt can take oxycodone and aspirin separately       Medication List       Accurate as of May 11, 2019 10:57 AM. If you have any questions, ask your nurse or doctor.        STOP taking these medications   amoxicillin-clavulanate 875-125 MG tablet Commonly known as: AUGMENTIN Stopped by:  Claretta Fraise, MD   traZODone 150 MG tablet Commonly known as: DESYREL Stopped by: Claretta Fraise, MD     TAKE these medications   acetaminophen 500 MG tablet Commonly known as: TYLENOL Take 500-1,000 mg by mouth daily as needed for headache.   alendronate 70 MG tablet Commonly known as: FOSAMAX Take 1 tablet (70 mg total) by mouth every 7 (seven) days. Take with a full glass of water on an empty stomach.   atorvastatin 40 MG tablet Commonly known as: LIPITOR Take 1 tablet (40 mg total) by mouth daily. Take for cholesterol control   Clear Eyes Max Redness Relief 0.03-0.5 % Soln Generic drug: Naphazoline-Glycerin Place 2 drops into both eyes 3 (three) times daily as needed (for redness/irritation.).   Ferrous Fumarate 324 (106 Fe) MG Tabs tablet Commonly known as: Ferrocite Take 1 tablet (106 mg of iron total) by mouth daily. What changed: when to take this   furosemide 40 MG tablet Commonly known as: LASIX Take 1 tablet (40 mg total) by mouth daily.   levothyroxine 125 MCG tablet Commonly known as: SYNTHROID Take 1 tablet (125 mcg total) by mouth daily.   meloxicam 15 MG tablet Commonly known as: MOBIC TAKE 1 TABLET (15 MG TOTAL) BY MOUTH DAILY WITH SUPPER.   multivitamin with minerals Tabs tablet Take 1 tablet by mouth daily.   polyethylene glycol 17 g packet Commonly known as: MIRALAX / GLYCOLAX Take 17 g by mouth daily as needed for mild constipation.   potassium chloride 10 MEQ CR capsule Commonly known as: MICRO-K Take 1 capsule (10 mEq total) by mouth daily.        Follow-up: Return in about 6 months (around 11/11/2019) for Wellness, Hypothyroidism.  Claretta Fraise, M.D.

## 2019-05-26 ENCOUNTER — Other Ambulatory Visit: Payer: Self-pay | Admitting: Family Medicine

## 2019-05-27 ENCOUNTER — Other Ambulatory Visit: Payer: Self-pay | Admitting: Family Medicine

## 2019-06-07 DIAGNOSIS — J9600 Acute respiratory failure, unspecified whether with hypoxia or hypercapnia: Secondary | ICD-10-CM | POA: Diagnosis not present

## 2019-08-07 DIAGNOSIS — J9600 Acute respiratory failure, unspecified whether with hypoxia or hypercapnia: Secondary | ICD-10-CM | POA: Diagnosis not present

## 2019-09-15 ENCOUNTER — Other Ambulatory Visit: Payer: Self-pay | Admitting: Family Medicine

## 2019-09-17 ENCOUNTER — Other Ambulatory Visit: Payer: Self-pay | Admitting: Family Medicine

## 2019-09-17 NOTE — Telephone Encounter (Signed)
NOV 11/11/19

## 2019-09-18 ENCOUNTER — Ambulatory Visit: Payer: BC Managed Care – PPO | Admitting: Family Medicine

## 2019-09-18 ENCOUNTER — Other Ambulatory Visit: Payer: Self-pay | Admitting: Family Medicine

## 2019-10-04 ENCOUNTER — Other Ambulatory Visit: Payer: Self-pay | Admitting: Family Medicine

## 2019-11-09 ENCOUNTER — Other Ambulatory Visit: Payer: Self-pay

## 2019-11-11 ENCOUNTER — Ambulatory Visit: Payer: 59 | Admitting: Family Medicine

## 2019-11-11 ENCOUNTER — Encounter: Payer: Self-pay | Admitting: Family Medicine

## 2019-11-11 ENCOUNTER — Other Ambulatory Visit: Payer: Self-pay

## 2019-11-11 ENCOUNTER — Ambulatory Visit (INDEPENDENT_AMBULATORY_CARE_PROVIDER_SITE_OTHER): Payer: 59

## 2019-11-11 ENCOUNTER — Other Ambulatory Visit: Payer: Self-pay | Admitting: Family Medicine

## 2019-11-11 VITALS — BP 112/72 | HR 66 | Temp 97.8°F | Ht 64.0 in | Wt 226.0 lb

## 2019-11-11 DIAGNOSIS — M810 Age-related osteoporosis without current pathological fracture: Secondary | ICD-10-CM

## 2019-11-11 DIAGNOSIS — D509 Iron deficiency anemia, unspecified: Secondary | ICD-10-CM | POA: Diagnosis not present

## 2019-11-11 DIAGNOSIS — E038 Other specified hypothyroidism: Secondary | ICD-10-CM | POA: Diagnosis not present

## 2019-11-11 DIAGNOSIS — E782 Mixed hyperlipidemia: Secondary | ICD-10-CM

## 2019-11-11 DIAGNOSIS — R600 Localized edema: Secondary | ICD-10-CM

## 2019-11-11 DIAGNOSIS — E559 Vitamin D deficiency, unspecified: Secondary | ICD-10-CM

## 2019-11-11 DIAGNOSIS — Z1231 Encounter for screening mammogram for malignant neoplasm of breast: Secondary | ICD-10-CM

## 2019-11-11 DIAGNOSIS — Z1211 Encounter for screening for malignant neoplasm of colon: Secondary | ICD-10-CM

## 2019-11-11 MED ORDER — MELOXICAM 15 MG PO TABS: 15.0000 mg | ORAL_TABLET | Freq: Every day | ORAL | 2 refills | Status: DC

## 2019-11-11 MED ORDER — ATORVASTATIN CALCIUM 40 MG PO TABS: 40.0000 mg | ORAL_TABLET | Freq: Every day | ORAL | 1 refills | Status: DC

## 2019-11-11 MED ORDER — FERROUS FUMARATE 324 (106 FE) MG PO TABS: 1.0000 | ORAL_TABLET | Freq: Every day | ORAL | 1 refills | Status: DC

## 2019-11-11 MED ORDER — FUROSEMIDE 40 MG PO TABS: 40.0000 mg | ORAL_TABLET | Freq: Every day | ORAL | 1 refills | Status: DC

## 2019-11-11 MED ORDER — POTASSIUM CHLORIDE ER 10 MEQ PO CPCR: ORAL_CAPSULE | ORAL | 1 refills | Status: DC

## 2019-11-11 MED ORDER — LEVOTHYROXINE SODIUM 125 MCG PO TABS: 125.0000 ug | ORAL_TABLET | Freq: Every day | ORAL | 3 refills | Status: DC

## 2019-11-11 NOTE — Progress Notes (Signed)
Subjective:  Patient ID: Makayla Gibson, female    DOB: [birth year]  Age: 59 y.o. MRN: [id number]  CC: Follow-up (6 month)   HPI Makayla Gibson presents for follow-up of multiple chronic conditions.  Patient presents for follow-up on  thyroid. The patient has a history of hypothyroidism for many years. It has been stable recently. Pt. denies any change in  voice, loss of hair, heat or cold intolerance. Energy level has been adequate to good. Patient denies constipation and diarrhea. No myxedema. Medication is as noted below. Verified that pt is taking it daily on an empty stomach. Well tolerated.  She says she is warm all the time not cold.  She has been taking iron for iron deficiency anemia.  Denies any fatigue.  Patient in for follow-up of elevated cholesterol. Doing well without complaints on current medication. Denies side effects of statin including myalgia and arthralgia and nausea. Also in today for liver function testing. Currently no chest pain, shortness of breath or other cardiovascular related symptoms noted.  Patient is also due for osteoporosis evaluation with DEXA.  She currently takes Fosamax for osteopenia.  She is reluctant to colonoscopy but is willing to perform a Cologuard.  She is interested in having her mammogram done here at the office by the traveling mobile unit.  That is scheduled for March 11.  Patient has some his issues with constipation for which she uses MiraLAX on a regular basis.  Depression screen [facility] 2/9 11/11/2019 05/11/2019 03/18/2019  Decreased Interest 0 0 0  Down, Depressed, Hopeless 0 0 0  PHQ - 2 Score 0 0 0  Altered sleeping - 0 -  Tired, decreased energy - 0 -  Change in appetite - 0 -  Feeling bad or failure about yourself  - 0 -  Trouble concentrating - 0 -  Moving slowly or fidgety/restless - 0 -  Suicidal thoughts - 0 -  PHQ-9 Score - 0 -    History Aritza has a past medical history of Abscess of breast ([id number]), Anemia,  Arthritis, Heart murmur, Hyperthyroidism, Osteoporosis, Sleep apnea, and Thyroid disease (11/2014).   She has a past surgical history that includes Cesarean section; Appendectomy; Incision and drainage abscess (Right, 07/29/2015); Breast cyst excision (Right, 09/06/2016); Mastectomy, partial (Right, 09/06/2016); Breast lumpectomy (Left, 01/29/2018); Debridement and closure wound (Left, 01/29/2018); and Breast reduction surgery (Left, 01/29/2018).   Her family history includes Alcohol abuse in her father; Cancer in her brother; Heart disease in her brother and mother; Osteoporosis in her brother and mother; Rheumatic fever in her mother.She reports that she quit smoking about 11 years ago. She has never used smokeless tobacco. She reports previous alcohol use. She reports that she does not use drugs.    ROS Review of Systems  Constitutional: Negative.   HENT: Negative for congestion.   Eyes: Negative for visual disturbance.  Respiratory: Negative for shortness of breath.   Cardiovascular: Negative for chest pain.  Gastrointestinal: Positive for constipation. Negative for abdominal pain, diarrhea, nausea and vomiting.  Genitourinary: Negative for difficulty urinating.  Musculoskeletal: Negative for arthralgias and myalgias.  Neurological: Negative for headaches.  Psychiatric/Behavioral: Negative for sleep disturbance.    Objective:  BP 112/72   Pulse 66   Temp 97.8 F (36.6 C) (Temporal)   Ht _0  (1.626 m)   Wt 226 lb (102.5 kg)   BMI 38.79 kg/m   BP Readings from Last 3 Encounters:  11/11/19 112/72  05/11/19 (!) 90/56  03/18/19 106/64  Wt Readings from Last 3 Encounters:  11/11/19 226 lb (102.5 kg)  05/11/19 224 lb (101.6 kg)  03/18/19 220 lb 3.2 oz (99.9 kg)     Physical Exam Constitutional:      General: She is not in acute distress.    Appearance: She is well-developed.  HENT:     Head: Normocephalic and atraumatic.     Right Ear: External ear normal.     Left Ear:  External ear normal.     Nose: Nose normal.  Eyes:     Conjunctiva/sclera: Conjunctivae normal.     Pupils: Pupils are equal, round, and reactive to light.  Neck:     Thyroid: No thyromegaly.  Cardiovascular:     Rate and Rhythm: Normal rate and regular rhythm.     Heart sounds: Normal heart sounds. No murmur.  Pulmonary:     Effort: Pulmonary effort is normal. No respiratory distress.     Breath sounds: Normal breath sounds. No wheezing or rales.  Abdominal:     General: Bowel sounds are normal. There is no distension.     Palpations: Abdomen is soft.     Tenderness: There is no abdominal tenderness.  Musculoskeletal:     Cervical back: Normal range of motion and neck supple.  Lymphadenopathy:     Cervical: No cervical adenopathy.  Skin:    General: Skin is warm and dry.  Neurological:     Mental Status: She is alert and oriented to person, place, and time.     Deep Tendon Reflexes: Reflexes are normal and symmetric.  Psychiatric:        Behavior: Behavior normal.        Thought Content: Thought content normal.        Judgment: Judgment normal.   DEXA 2 today shows stability.  She does have a T score of -2.2 at the wrist and -1.5 at the hip.   Assessment & Plan:   Oriyah was seen today for follow-up.  Diagnoses and all orders for this visit:  Other specified hypothyroidism -     levothyroxine (SYNTHROID) 125 MCG tablet; Take 1 tablet (125 mcg total) by mouth daily. -     CBC with Differential/Platelet -     CMP14+EGFR -     TSH + free T4  Iron deficiency anemia, unspecified iron deficiency anemia type -     Ferrous Fumarate (FERROCITE) 324 (106 Fe) MG TABS tablet; Take 1 tablet (106 mg of iron total) by mouth daily. -     CBC with Differential/Platelet -     CMP14+EGFR -     Iron, TIBC and Ferritin Panel  Mixed hyperlipidemia -     atorvastatin (LIPITOR) 40 MG tablet; Take 1 tablet (40 mg total) by mouth daily. Take for cholesterol control -     CBC with  Differential/Platelet -     CMP14+EGFR -     Lipid panel  Age-related osteoporosis without current pathological fracture -     DG Bone Density; Future  Vitamin D deficiency -     VITAMIN D 25 Hydroxy (Vit-D Deficiency, Fractures)  Encounter for screening mammogram for malignant neoplasm of breast -     MM 3D SCREEN BREAST BILATERAL; Future  Screen for colon cancer -     Cologuard  Edema of both legs -     potassium chloride (MICRO-K) 10 MEQ CR capsule; TAKE 1 CAPSULE BY MOUTH EVERY DAY -     furosemide (LASIX) 40 MG tablet; Take  1 tablet (40 mg total) by mouth daily. -     CMP14+EGFR  Other orders -     meloxicam (MOBIC) 15 MG tablet; Take 1 tablet (15 mg total) by mouth daily with supper.       I have discontinued [name]. Bergerson's traZODone. I have also changed her furosemide and levothyroxine. Additionally, I am having her maintain her multivitamin with minerals, Naphazoline-Glycerin, acetaminophen, polyethylene glycol, alendronate, potassium chloride, Ferrous Fumarate, meloxicam, and atorvastatin.  Allergies as of 11/11/2019      Reactions   Keflex [cephalexin] Itching, Rash   Percocet [oxycodone-acetaminophen] Itching, Rash   Pt can take oxycodone and tylenol separately    Percodan [oxycodone-aspirin] Itching, Rash   Pt can take oxycodone and aspirin separately       Medication List       Accurate as of November 11, 2019  5:54 PM. If you have any questions, ask your nurse or doctor.        STOP taking these medications   traZODone 150 MG tablet Commonly known as: DESYREL Stopped by: Claretta Fraise, MD     TAKE these medications   acetaminophen 500 MG tablet Commonly known as: TYLENOL Take 500-1,000 mg by mouth daily as needed for headache.   alendronate 70 MG tablet Commonly known as: FOSAMAX TAKE 1 TABLET BY MOUTH EVERY 7 DAYS. TAKE WITH A FULL GLASS OF WATER ON AN EMPTY STOMACH.   atorvastatin 40 MG tablet Commonly known as: LIPITOR Take 1  tablet (40 mg total) by mouth daily. Take for cholesterol control   Clear Eyes Max Redness Relief 0.03-0.5 % Soln Generic drug: Naphazoline-Glycerin Place 2 drops into both eyes 3 (three) times daily as needed (for redness/irritation.).   Ferrous Fumarate 324 (106 Fe) MG Tabs tablet Commonly known as: Ferrocite Take 1 tablet (106 mg of iron total) by mouth daily. What changed: when to take this   furosemide 40 MG tablet Commonly known as: LASIX Take 1 tablet (40 mg total) by mouth daily.   levothyroxine 125 MCG tablet Commonly known as: SYNTHROID Take 1 tablet (125 mcg total) by mouth daily.   meloxicam 15 MG tablet Commonly known as: MOBIC Take 1 tablet (15 mg total) by mouth daily with supper.   multivitamin with minerals Tabs tablet Take 1 tablet by mouth daily.   polyethylene glycol 17 g packet Commonly known as: MIRALAX / GLYCOLAX Take 17 g by mouth daily as needed for mild constipation.   potassium chloride 10 MEQ CR capsule Commonly known as: MICRO-K TAKE 1 CAPSULE BY MOUTH EVERY DAY What changed:   how much to take  how to take this  when to take this  additional instructions Changed by: Claretta Fraise, MD        Follow-up: Return in about 6 weeks (around 12/23/2019) for Compete physical with pap.  Claretta Fraise, M.D.

## 2019-11-12 LAB — LIPID PANEL
Chol/HDL Ratio: 3.7 ratio (ref 0.0–4.4)
Cholesterol, Total: 162 mg/dL (ref 100–199)
HDL: 44 mg/dL (ref >39)
LDL Chol Calc (NIH): 86 mg/dL (ref 0–99)
Triglycerides: 186 mg/dL — ABNORMAL HIGH (ref 0–149)
VLDL Cholesterol Cal: 32 mg/dL (ref 5–40)

## 2019-11-12 LAB — CBC WITH DIFFERENTIAL/PLATELET
Basophils Absolute: 0.1 10*3/uL (ref 0.0–0.2)
Basos: 1 %
EOS (ABSOLUTE): 0.2 10*3/uL (ref 0.0–0.4)
Eos: 2 %
Hematocrit: 39.2 % (ref 34.0–46.6)
Hemoglobin: 13.6 g/dL (ref 11.1–15.9)
Immature Grans (Abs): 0 10*3/uL (ref 0.0–0.1)
Immature Granulocytes: 0 %
Lymphocytes Absolute: 2.3 10*3/uL (ref 0.7–3.1)
Lymphs: 27 %
MCH: 32.1 pg (ref 26.6–33.0)
MCHC: 34.7 g/dL (ref 31.5–35.7)
MCV: 93 fL (ref 79–97)
Monocytes Absolute: 0.6 10*3/uL (ref 0.1–0.9)
Monocytes: 7 %
Neutrophils Absolute: 5.2 10*3/uL (ref 1.4–7.0)
Neutrophils: 63 %
Platelets: 226 10*3/uL (ref 150–450)
RBC: 4.24 x10E6/uL (ref 3.77–5.28)
RDW: 12 % (ref 11.7–15.4)
WBC: 8.4 10*3/uL (ref 3.4–10.8)

## 2019-11-12 LAB — VITAMIN D 25 HYDROXY (VIT D DEFICIENCY, FRACTURES): Vit D, 25-Hydroxy: 32.8 ng/mL (ref 30.0–100.0)

## 2019-11-12 LAB — CMP14+EGFR
ALT: 23 IU/L (ref 0–32)
AST: 21 IU/L (ref 0–40)
Albumin/Globulin Ratio: 1.9 (ref 1.2–2.2)
Albumin: 4.3 g/dL (ref 3.8–4.9)
Alkaline Phosphatase: 51 IU/L (ref 39–117)
BUN/Creatinine Ratio: 50 — ABNORMAL HIGH (ref 9–23)
BUN: 26 mg/dL — ABNORMAL HIGH (ref 6–24)
Bilirubin Total: 0.4 mg/dL (ref 0.0–1.2)
CO2: 25 mmol/L (ref 20–29)
Calcium: 9.7 mg/dL (ref 8.7–10.2)
Chloride: 103 mmol/L (ref 96–106)
Creatinine, Ser: 0.52 mg/dL — ABNORMAL LOW (ref 0.57–1.00)
GFR calc Af Amer: 123 mL/min/{1.73_m2} (ref >59)
GFR calc non Af Amer: 106 mL/min/{1.73_m2} (ref >59)
Globulin, Total: 2.3 g/dL (ref 1.5–4.5)
Glucose: 115 mg/dL — ABNORMAL HIGH (ref 65–99)
Potassium: 4.6 mmol/L (ref 3.5–5.2)
Sodium: 143 mmol/L (ref 134–144)
Total Protein: 6.6 g/dL (ref 6.0–8.5)

## 2019-11-12 LAB — IRON,TIBC AND FERRITIN PANEL
Ferritin: 653 ng/mL — ABNORMAL HIGH (ref 15–150)
Iron Saturation: 35 % (ref 15–55)
Iron: 84 ug/dL (ref 27–159)
Total Iron Binding Capacity: 243 ug/dL — ABNORMAL LOW (ref 250–450)
UIBC: 159 ug/dL (ref 131–425)

## 2019-11-12 LAB — TSH+FREE T4
Free T4: 1.45 ng/dL (ref 0.82–1.77)
TSH: 3.03 u[IU]/mL (ref 0.450–4.500)

## 2019-11-12 NOTE — Progress Notes (Signed)
Hello Desteny,  Your lab result is normal and/or stable.Some minor variations that are not significant are commonly marked abnormal, but do not represent any medical problem for you.  Best regards, Akil Hoos, M.D.

## 2019-12-23 ENCOUNTER — Other Ambulatory Visit: Payer: Self-pay

## 2019-12-23 ENCOUNTER — Ambulatory Visit (INDEPENDENT_AMBULATORY_CARE_PROVIDER_SITE_OTHER): Payer: 59 | Admitting: Family Medicine

## 2019-12-23 ENCOUNTER — Encounter: Payer: Self-pay | Admitting: Family Medicine

## 2019-12-23 VITALS — BP 110/73 | HR 67 | Temp 97.7°F | Ht 64.0 in | Wt 223.0 lb

## 2019-12-23 DIAGNOSIS — Z0000 Encounter for general adult medical examination without abnormal findings: Secondary | ICD-10-CM

## 2019-12-23 DIAGNOSIS — D509 Iron deficiency anemia, unspecified: Secondary | ICD-10-CM

## 2019-12-23 DIAGNOSIS — Z0001 Encounter for general adult medical examination with abnormal findings: Secondary | ICD-10-CM

## 2019-12-23 DIAGNOSIS — E038 Other specified hypothyroidism: Secondary | ICD-10-CM

## 2019-12-23 DIAGNOSIS — M810 Age-related osteoporosis without current pathological fracture: Secondary | ICD-10-CM

## 2019-12-23 DIAGNOSIS — E782 Mixed hyperlipidemia: Secondary | ICD-10-CM | POA: Diagnosis not present

## 2019-12-23 DIAGNOSIS — Z01419 Encounter for gynecological examination (general) (routine) without abnormal findings: Secondary | ICD-10-CM

## 2019-12-23 NOTE — Progress Notes (Signed)
Subjective:  Patient ID: Makayla Gibson, female    DOB: 04-Oct-1960  Age: 59 y.o. MRN: [id number]  CC: Annual Exam and Gynecologic Exam   HPI [name] presents for CPE with pap. Last pap was done 4 years ago.  Depression screen [facility] 2/9 12/23/2019 11/11/2019 05/11/2019  Decreased Interest 0 0 0  Down, Depressed, Hopeless 0 0 0  PHQ - 2 Score 0 0 0  Altered sleeping - - 0  Tired, decreased energy - - 0  Change in appetite - - 0  Feeling bad or failure about yourself  - - 0  Trouble concentrating - - 0  Moving slowly or fidgety/restless - - 0  Suicidal thoughts - - 0  PHQ-9 Score - - 0    History [name] has a past medical history of Abscess of breast [id number]), Anemia, Arthritis, Heart murmur, Hyperthyroidism, Osteoporosis, Sleep apnea, and Thyroid disease (11/2014).   She has a past surgical history that includes Cesarean section; Appendectomy; Incision and drainage abscess (Right, 07/29/2015); Breast cyst excision (Right, 09/06/2016); Mastectomy, partial (Right, 09/06/2016); Breast lumpectomy (Left, 01/29/2018); Debridement and closure wound (Left, 01/29/2018); and Breast reduction surgery (Left, 01/29/2018).   Her family history includes Alcohol abuse in her father; Cancer in her brother; Heart disease in her brother and mother; Osteoporosis in her brother and mother; Rheumatic fever in her mother.She reports that she quit smoking about 11 years ago. She has never used smokeless tobacco. She reports previous alcohol use. She reports that she does not use drugs.    ROS Review of Systems  Constitutional: Negative for appetite change, chills, diaphoresis, fatigue, fever and unexpected weight change.  HENT: Negative for congestion, ear pain, hearing loss, postnasal drip, rhinorrhea, sneezing, sore throat and trouble swallowing.   Eyes: Negative for pain.  Respiratory: Negative for cough, chest tightness and shortness of breath.   Cardiovascular: Negative for chest pain and  palpitations.  Gastrointestinal: Negative for abdominal pain, constipation, diarrhea, nausea and vomiting.  Endocrine: Negative for cold intolerance, heat intolerance, polydipsia, polyphagia and polyuria.  Genitourinary: Negative for dysuria, frequency and menstrual problem.  Musculoskeletal: Negative for arthralgias and joint swelling.  Skin: Negative for rash.  Allergic/Immunologic: Negative for environmental allergies.  Neurological: Negative for dizziness, weakness, numbness and headaches.  Psychiatric/Behavioral: Negative for agitation and dysphoric mood.    Objective:  BP 110/73   Pulse 67   Temp 97.7 F (36.5 C) (Temporal)   Ht 5\' 4"  (1.626 m)   Wt 223 lb (101.2 kg)   BMI 38.28 kg/m   BP Readings from Last 3 Encounters:  12/23/19 110/73  11/11/19 112/72  05/11/19 (!) 90/56    Wt Readings from Last 3 Encounters:  12/23/19 223 lb (101.2 kg)  11/11/19 226 lb (102.5 kg)  05/11/19 224 lb (101.6 kg)     Physical Exam Exam conducted with a chaperone present.  Constitutional:      General: She is not in acute distress.    Appearance: Normal appearance. She is well-developed.  HENT:     Head: Normocephalic and atraumatic.     Right Ear: External ear normal.     Left Ear: External ear normal.     Nose: Nose normal.  Eyes:     Conjunctiva/sclera: Conjunctivae normal.     Pupils: Pupils are equal, round, and reactive to light.  Neck:     Thyroid: No thyromegaly.  Cardiovascular:     Rate and Rhythm: Normal rate and regular rhythm.     Heart  sounds: Normal heart sounds. No murmur.  Pulmonary:     Effort: Pulmonary effort is normal. No respiratory distress.     Breath sounds: Normal breath sounds. No wheezing or rales.  Chest:     Chest wall: Deformity (partial right mastectomy causes defornity (From removal of infected cyst.)) present.     Breasts: Breasts are asymmetrical.        Right: No inverted nipple, mass or tenderness.        Left: No inverted nipple, mass  or tenderness.  Abdominal:     General: Bowel sounds are normal. There is no distension or abdominal bruit.     Palpations: Abdomen is soft. There is no hepatomegaly, splenomegaly or mass.     Tenderness: There is no abdominal tenderness. Negative signs include Murphy's sign and McBurney's sign.     Hernia: There is no hernia in the left inguinal area or right inguinal area.  Genitourinary:    General: Normal vulva.     Exam position: Lithotomy position.     Tanner stage (genital): 5.     Vagina: No vaginal discharge, erythema, tenderness, bleeding, lesions or prolapsed vaginal walls.     Cervix: No friability.  Musculoskeletal:        General: No tenderness. Normal range of motion.     Cervical back: Normal range of motion and neck supple.  Lymphadenopathy:     Cervical: No cervical adenopathy.     Lower Body: No right inguinal adenopathy. No left inguinal adenopathy.  Skin:    General: Skin is warm and dry.     Findings: No rash.  Neurological:     Mental Status: She is alert and oriented to person, place, and time.     Deep Tendon Reflexes: Reflexes are normal and symmetric.  Psychiatric:        Behavior: Behavior normal.        Thought Content: Thought content normal.        Judgment: Judgment normal.       Assessment & Plan:   Bellagrace was seen today for annual exam and gynecologic exam.  Diagnoses and all orders for this visit:  Physical exam, annual -     Urinalysis, Complete -     Pap IG w/ reflex to HPV when ASC-U  Mixed hyperlipidemia  Age-related osteoporosis without current pathological fracture  Other specified hypothyroidism  Iron deficiency anemia, unspecified iron deficiency anemia type  Encounter for gynecological examination -     Pap IG w/ reflex to HPV when ASC-U       I am having [name]. Hodgkin maintain her multivitamin with minerals, Naphazoline-Glycerin, acetaminophen, polyethylene glycol, alendronate, potassium chloride, Ferrous  Fumarate, meloxicam, furosemide, atorvastatin, and levothyroxine.  Allergies as of 12/23/2019      Reactions   Keflex [cephalexin] Itching, Rash   Percocet [oxycodone-acetaminophen] Itching, Rash   Pt can take oxycodone and tylenol separately    Percodan [oxycodone-aspirin] Itching, Rash   Pt can take oxycodone and aspirin separately       Medication List       Accurate as of December 23, 2019  5:40 PM. If you have any questions, ask your nurse or doctor.        acetaminophen 500 MG tablet Commonly known as: TYLENOL Take 500-1,000 mg by mouth daily as needed for headache.   alendronate 70 MG tablet Commonly known as: FOSAMAX TAKE 1 TABLET BY MOUTH EVERY 7 DAYS. TAKE WITH A FULL GLASS OF WATER ON AN  EMPTY STOMACH.   atorvastatin 40 MG tablet Commonly known as: LIPITOR Take 1 tablet (40 mg total) by mouth daily. Take for cholesterol control   Clear Eyes Max Redness Relief 0.03-0.5 % Soln Generic drug: Naphazoline-Glycerin Place 2 drops into both eyes 3 (three) times daily as needed (for redness/irritation.).   Ferrous Fumarate 324 (106 Fe) MG Tabs tablet Commonly known as: Ferrocite Take 1 tablet (106 mg of iron total) by mouth daily.   furosemide 40 MG tablet Commonly known as: LASIX Take 1 tablet (40 mg total) by mouth daily.   levothyroxine 125 MCG tablet Commonly known as: SYNTHROID Take 1 tablet (125 mcg total) by mouth daily.   meloxicam 15 MG tablet Commonly known as: MOBIC Take 1 tablet (15 mg total) by mouth daily with supper.   multivitamin with minerals Tabs tablet Take 1 tablet by mouth daily.   polyethylene glycol 17 g packet Commonly known as: MIRALAX / GLYCOLAX Take 17 g by mouth daily as needed for mild constipation.   potassium chloride 10 MEQ CR capsule Commonly known as: MICRO-K TAKE 1 CAPSULE BY MOUTH EVERY DAY        Follow-up: Return in about 6 months (around 06/24/2020).  Claretta Fraise, M.D.

## 2019-12-25 LAB — IGP, RFX APTIMA HPV ASCU

## 2020-03-25 ENCOUNTER — Other Ambulatory Visit: Payer: Self-pay | Admitting: Family Medicine

## 2020-03-25 NOTE — Telephone Encounter (Signed)
Last OV 11/2019.   Next OV 05/2020

## 2020-05-04 ENCOUNTER — Other Ambulatory Visit: Payer: Self-pay | Admitting: Family Medicine

## 2020-05-04 DIAGNOSIS — R600 Localized edema: Secondary | ICD-10-CM

## 2020-05-10 ENCOUNTER — Other Ambulatory Visit: Payer: Self-pay

## 2020-05-10 ENCOUNTER — Ambulatory Visit: Payer: 59 | Admitting: Family Medicine

## 2020-05-10 ENCOUNTER — Encounter: Payer: Self-pay | Admitting: Family Medicine

## 2020-05-10 VITALS — BP 115/65 | HR 45 | Temp 97.6°F | Ht 64.0 in | Wt 222.8 lb

## 2020-05-10 DIAGNOSIS — D509 Iron deficiency anemia, unspecified: Secondary | ICD-10-CM

## 2020-05-10 DIAGNOSIS — E038 Other specified hypothyroidism: Secondary | ICD-10-CM | POA: Diagnosis not present

## 2020-05-10 DIAGNOSIS — E782 Mixed hyperlipidemia: Secondary | ICD-10-CM | POA: Diagnosis not present

## 2020-05-10 NOTE — Progress Notes (Signed)
Subjective:  Patient ID: Makayla Gibson, female    DOB: November 13, 1960  Age: 59 y.o. MRN: [id number]  CC: Follow-up (6 month)   HPI Makayla Gibson presents for  follow-up on  thyroid. The patient has a history of hypothyroidism for many years. It has been stable recently. Pt. denies any change in  voice, loss of hair, heat or cold intolerance. Energy level has been adequate to good. Patient denies constipation and diarrhea. No myxedema. Medication is as noted below. Verified that pt is taking it daily on an empty stomach. Well tolerated.   Depression screen [facility] 2/9 05/10/2020 12/23/2019 11/11/2019  Decreased Interest 0 0 0  Down, Depressed, Hopeless 0 0 0  PHQ - 2 Score 0 0 0  Altered sleeping - - -  Tired, decreased energy - - -  Change in appetite - - -  Feeling bad or failure about yourself  - - -  Trouble concentrating - - -  Moving slowly or fidgety/restless - - -  Suicidal thoughts - - -  PHQ-9 Score - - -    History Makayla Gibson has a past medical history of Abscess of breast ([id number]), Anemia, Arthritis, Heart murmur, Hyperthyroidism, Osteoporosis, Sleep apnea, and Thyroid disease (11/2014).   She has a past surgical history that includes Cesarean section; Appendectomy; Incision and drainage abscess (Right, 07/29/2015); Breast cyst excision (Right, 09/06/2016); Mastectomy, partial (Right, 09/06/2016); Breast lumpectomy (Left, 01/29/2018); Debridement and closure wound (Left, 01/29/2018); and Breast reduction surgery (Left, 01/29/2018).   Her family history includes Alcohol abuse in her father; Cancer in her brother; Heart disease in her brother and mother; Osteoporosis in her brother and mother; Rheumatic fever in her mother.She reports that she quit smoking about 11 years ago. She has never used smokeless tobacco. She reports previous alcohol use. She reports that she does not use drugs.    ROS Review of Systems  Constitutional: Negative.   HENT: Negative for congestion.   Eyes:  Negative for visual disturbance.  Respiratory: Negative for shortness of breath.   Cardiovascular: Negative for chest pain.  Gastrointestinal: Negative for abdominal pain, constipation, diarrhea, nausea and vomiting.  Genitourinary: Negative for difficulty urinating.  Musculoskeletal: Negative for arthralgias and myalgias.  Neurological: Negative for headaches.  Psychiatric/Behavioral: Negative for sleep disturbance.    Objective:  BP 115/65    Pulse (!) 45    Temp 97.6 F (36.4 C) (Temporal)    Ht _0  (1.626 m)    Wt 222 lb 12.8 oz (101.1 kg)    BMI 38.24 kg/m   BP Readings from Last 3 Encounters:  05/10/20 115/65  12/23/19 110/73  11/11/19 112/72    Wt Readings from Last 3 Encounters:  05/10/20 222 lb 12.8 oz (101.1 kg)  12/23/19 223 lb (101.2 kg)  11/11/19 226 lb (102.5 kg)     Physical Exam Constitutional:      General: She is not in acute distress.    Appearance: She is well-developed.  HENT:     Head: Normocephalic and atraumatic.  Eyes:     Conjunctiva/sclera: Conjunctivae normal.     Pupils: Pupils are equal, round, and reactive to light.  Neck:     Thyroid: No thyromegaly.  Cardiovascular:     Rate and Rhythm: Normal rate and regular rhythm.     Heart sounds: Normal heart sounds. No murmur heard.   Pulmonary:     Effort: Pulmonary effort is normal. No respiratory distress.     Breath sounds: Normal breath sounds.  No wheezing or rales.  Abdominal:     General: Bowel sounds are normal. There is no distension.     Palpations: Abdomen is soft.     Tenderness: There is no abdominal tenderness.  Musculoskeletal:        General: Normal range of motion.     Cervical back: Normal range of motion and neck supple.  Lymphadenopathy:     Cervical: No cervical adenopathy.  Skin:    General: Skin is warm and dry.  Neurological:     Mental Status: She is alert and oriented to person, place, and time.  Psychiatric:        Behavior: Behavior normal.         Thought Content: Thought content normal.        Judgment: Judgment normal.       Assessment & Plan:   Makayla Gibson was seen today for follow-up.  Diagnoses and all orders for this visit:  Iron deficiency anemia, unspecified iron deficiency anemia type -     CBC with Differential/Platelet -     CMP14+EGFR  Mixed hyperlipidemia -     CBC with Differential/Platelet -     CMP14+EGFR -     Lipid panel  Other specified hypothyroidism -     CBC with Differential/Platelet -     CMP14+EGFR -     TSH + free T4       I am having Makayla Gibson maintain her multivitamin with minerals, Naphazoline-Glycerin, acetaminophen, polyethylene glycol, alendronate, Ferrous Fumarate, atorvastatin, levothyroxine, meloxicam, furosemide, and potassium chloride.  Allergies as of 05/10/2020      Reactions   Keflex [cephalexin] Itching, Rash   Percocet [oxycodone-acetaminophen] Itching, Rash   Pt can take oxycodone and tylenol separately    Percodan [oxycodone-aspirin] Itching, Rash   Pt can take oxycodone and aspirin separately       Medication List       Accurate as of May 10, 2020 11:59 PM. If you have any questions, ask your nurse or doctor.        acetaminophen 500 MG tablet Commonly known as: TYLENOL Take 500-1,000 mg by mouth daily as needed for headache.   alendronate 70 MG tablet Commonly known as: FOSAMAX TAKE 1 TABLET BY MOUTH EVERY 7 DAYS. TAKE WITH A FULL GLASS OF WATER ON AN EMPTY STOMACH.   atorvastatin 40 MG tablet Commonly known as: LIPITOR Take 1 tablet (40 mg total) by mouth daily. Take for cholesterol control   Clear Eyes Max Redness Relief 0.03-0.5 % Soln Generic drug: Naphazoline-Glycerin Place 2 drops into both eyes 3 (three) times daily as needed (for redness/irritation.).   Ferrous Fumarate 324 (106 Fe) MG Tabs tablet Commonly known as: Ferrocite Take 1 tablet (106 mg of iron total) by mouth daily.   furosemide 40 MG tablet Commonly known as:  LASIX TAKE 1 TABLET BY MOUTH EVERY DAY   levothyroxine 125 MCG tablet Commonly known as: SYNTHROID Take 1 tablet (125 mcg total) by mouth daily.   meloxicam 15 MG tablet Commonly known as: MOBIC TAKE 1 TABLET (15 MG TOTAL) BY MOUTH DAILY WITH SUPPER.   multivitamin with minerals Tabs tablet Take 1 tablet by mouth daily.   polyethylene glycol 17 g packet Commonly known as: MIRALAX / GLYCOLAX Take 17 g by mouth daily as needed for mild constipation.   potassium chloride 10 MEQ CR capsule Commonly known as: MICRO-K TAKE 1 CAPSULE BY MOUTH EVERY DAY        Follow-up: No  follow-ups on file.  Claretta Fraise, M.D.

## 2020-05-11 LAB — CMP14+EGFR
ALT: 31 IU/L (ref 0–32)
AST: 20 IU/L (ref 0–40)
Albumin/Globulin Ratio: 1.8 (ref 1.2–2.2)
Albumin: 4.2 g/dL (ref 3.8–4.9)
Alkaline Phosphatase: 57 IU/L (ref 48–121)
BUN/Creatinine Ratio: 41 — ABNORMAL HIGH (ref 9–23)
BUN: 21 mg/dL (ref 6–24)
Bilirubin Total: 0.3 mg/dL (ref 0.0–1.2)
CO2: 26 mmol/L (ref 20–29)
Calcium: 9.7 mg/dL (ref 8.7–10.2)
Chloride: 105 mmol/L (ref 96–106)
Creatinine, Ser: 0.51 mg/dL — ABNORMAL LOW (ref 0.57–1.00)
GFR calc Af Amer: 123 mL/min/{1.73_m2} (ref >59)
GFR calc non Af Amer: 106 mL/min/{1.73_m2} (ref >59)
Globulin, Total: 2.4 g/dL (ref 1.5–4.5)
Glucose: 110 mg/dL — ABNORMAL HIGH (ref 65–99)
Potassium: 4.8 mmol/L (ref 3.5–5.2)
Sodium: 143 mmol/L (ref 134–144)
Total Protein: 6.6 g/dL (ref 6.0–8.5)

## 2020-05-11 LAB — CBC WITH DIFFERENTIAL/PLATELET
Basophils Absolute: 0.1 10*3/uL (ref 0.0–0.2)
Basos: 1 %
EOS (ABSOLUTE): 0.2 10*3/uL (ref 0.0–0.4)
Eos: 2 %
Hematocrit: 39 % (ref 34.0–46.6)
Hemoglobin: 13.1 g/dL (ref 11.1–15.9)
Immature Grans (Abs): 0 10*3/uL (ref 0.0–0.1)
Immature Granulocytes: 0 %
Lymphocytes Absolute: 2 10*3/uL (ref 0.7–3.1)
Lymphs: 24 %
MCH: 31.6 pg (ref 26.6–33.0)
MCHC: 33.6 g/dL (ref 31.5–35.7)
MCV: 94 fL (ref 79–97)
Monocytes Absolute: 0.6 10*3/uL (ref 0.1–0.9)
Monocytes: 7 %
Neutrophils Absolute: 5.4 10*3/uL (ref 1.4–7.0)
Neutrophils: 66 %
Platelets: 207 10*3/uL (ref 150–450)
RBC: 4.15 x10E6/uL (ref 3.77–5.28)
RDW: 12.1 % (ref 11.7–15.4)
WBC: 8.2 10*3/uL (ref 3.4–10.8)

## 2020-05-11 LAB — LIPID PANEL
Chol/HDL Ratio: 3.2 ratio (ref 0.0–4.4)
Cholesterol, Total: 148 mg/dL (ref 100–199)
HDL: 46 mg/dL (ref >39)
LDL Chol Calc (NIH): 81 mg/dL (ref 0–99)
Triglycerides: 116 mg/dL (ref 0–149)
VLDL Cholesterol Cal: 21 mg/dL (ref 5–40)

## 2020-05-11 LAB — TSH+FREE T4
Free T4: 1.42 ng/dL (ref 0.82–1.77)
TSH: 2.6 u[IU]/mL (ref 0.450–4.500)

## 2020-05-11 NOTE — Progress Notes (Signed)
Hello Makayla Gibson,  Your lab result is normal and/or stable.Some minor variations that are not significant are commonly marked abnormal, but do not represent any medical problem for you.  Best regards, Kariyah Baugh, M.D.

## 2020-05-17 ENCOUNTER — Encounter: Payer: Self-pay | Admitting: Family Medicine

## 2020-06-15 ENCOUNTER — Other Ambulatory Visit: Payer: Self-pay | Admitting: Family Medicine

## 2020-07-27 ENCOUNTER — Other Ambulatory Visit: Payer: Self-pay | Admitting: Family Medicine

## 2020-07-27 DIAGNOSIS — R600 Localized edema: Secondary | ICD-10-CM

## 2020-09-19 ENCOUNTER — Other Ambulatory Visit: Payer: Self-pay | Admitting: Family Medicine

## 2020-10-01 ENCOUNTER — Other Ambulatory Visit: Payer: Self-pay | Admitting: Family Medicine

## 2020-10-01 DIAGNOSIS — E782 Mixed hyperlipidemia: Secondary | ICD-10-CM

## 2020-10-14 ENCOUNTER — Other Ambulatory Visit: Payer: Self-pay | Admitting: Family Medicine

## 2020-10-19 ENCOUNTER — Other Ambulatory Visit: Payer: Self-pay | Admitting: Family Medicine

## 2020-10-19 DIAGNOSIS — R600 Localized edema: Secondary | ICD-10-CM

## 2020-11-10 ENCOUNTER — Other Ambulatory Visit: Payer: Self-pay

## 2020-11-10 ENCOUNTER — Ambulatory Visit (INDEPENDENT_AMBULATORY_CARE_PROVIDER_SITE_OTHER): Payer: 59 | Admitting: Family Medicine

## 2020-11-10 ENCOUNTER — Ambulatory Visit: Payer: 59 | Admitting: Family Medicine

## 2020-11-10 DIAGNOSIS — E782 Mixed hyperlipidemia: Secondary | ICD-10-CM | POA: Diagnosis not present

## 2020-11-10 DIAGNOSIS — D509 Iron deficiency anemia, unspecified: Secondary | ICD-10-CM

## 2020-11-10 DIAGNOSIS — E038 Other specified hypothyroidism: Secondary | ICD-10-CM | POA: Diagnosis not present

## 2020-11-10 DIAGNOSIS — R600 Localized edema: Secondary | ICD-10-CM | POA: Diagnosis not present

## 2020-11-10 MED ORDER — MELOXICAM 15 MG PO TABS
15.0000 mg | ORAL_TABLET | Freq: Every day | ORAL | 2 refills | Status: DC
Start: 2020-11-10 — End: 2021-03-24

## 2020-11-10 MED ORDER — FUROSEMIDE 40 MG PO TABS: 40.0000 mg | ORAL_TABLET | Freq: Every day | ORAL | 1 refills | Status: DC

## 2020-11-10 MED ORDER — ALENDRONATE SODIUM 70 MG PO TABS
ORAL_TABLET | ORAL | 0 refills | Status: DC
Start: 2020-11-10 — End: 2021-01-18

## 2020-11-10 MED ORDER — ATORVASTATIN CALCIUM 40 MG PO TABS: 40.0000 mg | ORAL_TABLET | Freq: Every day | ORAL | 1 refills | Status: DC

## 2020-11-10 MED ORDER — LEVOTHYROXINE SODIUM 125 MCG PO TABS: 125.0000 ug | ORAL_TABLET | Freq: Every day | ORAL | 3 refills | Status: DC

## 2020-11-10 MED ORDER — FERROUS FUMARATE 324 (106 FE) MG PO TABS: 1.0000 | ORAL_TABLET | Freq: Every day | ORAL | 1 refills | Status: DC

## 2020-11-10 NOTE — Progress Notes (Signed)
Subjective:  Patient ID: Makayla Gibson, female    DOB: 08-04-1961  Age: 60 y.o. MRN: [id number]  CC: Medical Management of Chronic Issues   HPI Makayla Gibson presents for patient presents for follow-up on  thyroid. The patient has a history of hypothyroidism for many years. It has been stable recently. Pt. denies any change in  voice, loss of hair, heat or cold intolerance. Energy level has been adequate to good. Patient denies constipation and diarrhea. No myxedema. Medication is as noted below. Verified that pt is taking it daily on an empty stomach. Well tolerated.  Patient in for follow-up of elevated cholesterol. Doing well without complaints on current medication. Denies side effects of statin including myalgia and arthralgia and nausea. Also in today for liver function testing. Currently no chest pain, shortness of breath or other cardiovascular related symptoms noted.  She has had anemia.  She denies any source of bleeding including melena hematochezia excessive bruising.  She is here also to have blood counts for that. Depression screen [facility] 2/9 11/10/2020 05/10/2020 12/23/2019  Decreased Interest 0 0 0  Down, Depressed, Hopeless 0 0 0  PHQ - 2 Score 0 0 0  Altered sleeping - - -  Tired, decreased energy - - -  Change in appetite - - -  Feeling bad or failure about yourself  - - -  Trouble concentrating - - -  Moving slowly or fidgety/restless - - -  Suicidal thoughts - - -  PHQ-9 Score - - -    History Makayla Gibson has a past medical history of Abscess of breast ([id number]), Anemia, Arthritis, Heart murmur, Hyperthyroidism, Osteoporosis, Sleep apnea, and Thyroid disease (11/2014).   She has a past surgical history that includes Cesarean section; Appendectomy; Incision and drainage abscess (Right, 07/29/2015); Breast cyst excision (Right, 09/06/2016); Mastectomy, partial (Right, 09/06/2016); Breast lumpectomy (Left, 01/29/2018); Debridement and closure wound (Left, 01/29/2018); and  Breast reduction surgery (Left, 01/29/2018).   Her family history includes Alcohol abuse in her father; Cancer in her brother; Heart disease in her brother and mother; Osteoporosis in her brother and mother; Rheumatic fever in her mother.She reports that she quit smoking about 12 years ago. She has never used smokeless tobacco. She reports previous alcohol use. She reports that she does not use drugs.    ROS Review of Systems  Constitutional: Negative.   HENT: Negative.   Eyes: Negative for visual disturbance.  Respiratory: Negative for shortness of breath.   Cardiovascular: Negative for chest pain.  Gastrointestinal: Negative for abdominal pain.  Musculoskeletal: Negative for arthralgias.    Objective:  BP 138/83   Pulse 69   Temp 97.9 F (36.6 C) (Temporal)   Ht _0  (1.626 m)   Wt 210 lb 12.8 oz (95.6 kg)   BMI 36.18 kg/m   BP Readings from Last 3 Encounters:  11/10/20 138/83  05/10/20 115/65  12/23/19 110/73    Wt Readings from Last 3 Encounters:  11/10/20 210 lb 12.8 oz (95.6 kg)  05/10/20 222 lb 12.8 oz (101.1 kg)  12/23/19 223 lb (101.2 kg)     Physical Exam Constitutional:      General: She is not in acute distress.    Appearance: She is well-developed.  HENT:     Head: Normocephalic and atraumatic.  Eyes:     Conjunctiva/sclera: Conjunctivae normal.     Pupils: Pupils are equal, round, and reactive to light.  Neck:     Thyroid: No thyromegaly.  Cardiovascular:  Rate and Rhythm: Normal rate and regular rhythm.     Heart sounds: Normal heart sounds. No murmur heard.   Pulmonary:     Effort: Pulmonary effort is normal. No respiratory distress.     Breath sounds: Normal breath sounds. No wheezing or rales.  Abdominal:     General: Bowel sounds are normal. There is no distension.     Palpations: Abdomen is soft.     Tenderness: There is no abdominal tenderness.  Musculoskeletal:        General: Normal range of motion.     Cervical back: Normal  range of motion and neck supple.  Lymphadenopathy:     Cervical: No cervical adenopathy.  Skin:    General: Skin is warm and dry.  Neurological:     Mental Status: She is alert and oriented to person, place, and time.  Psychiatric:        Behavior: Behavior normal.        Thought Content: Thought content normal.        Judgment: Judgment normal.       Assessment & Plan:   Makayla Gibson was seen today for medical management of chronic issues.  Diagnoses and all orders for this visit:  Other specified hypothyroidism -     levothyroxine (SYNTHROID) 125 MCG tablet; Take 1 tablet (125 mcg total) by mouth daily. -     CBC with Differential/Platelet -     CMP14+EGFR -     TSH + free T4  Edema of both legs -     furosemide (LASIX) 40 MG tablet; Take 1 tablet (40 mg total) by mouth daily. -     CBC with Differential/Platelet -     CMP14+EGFR -     TSH + free T4  Iron deficiency anemia, unspecified iron deficiency anemia type -     Ferrous Fumarate (FERROCITE) 324 (106 Fe) MG TABS tablet; Take 1 tablet (106 mg of iron total) by mouth daily. -     CBC with Differential/Platelet -     CMP14+EGFR -     TSH + free T4  Mixed hyperlipidemia -     atorvastatin (LIPITOR) 40 MG tablet; Take 1 tablet (40 mg total) by mouth daily. Take for cholesterol control -     CBC with Differential/Platelet -     CMP14+EGFR -     TSH + free T4 -     Lipid panel  Other orders -     meloxicam (MOBIC) 15 MG tablet; Take 1 tablet (15 mg total) by mouth daily with supper. -     alendronate (FOSAMAX) 70 MG tablet; TAKE 1 TABLET BY MOUTH EVERY 7 DAYS. TAKE WITH A FULL GLASS OF WATER ON AN EMPTY STOMACH.       I have changed [name]'s furosemide. I am also having her maintain her multivitamin with minerals, Naphazoline-Glycerin, acetaminophen, polyethylene glycol, potassium chloride, meloxicam, levothyroxine, Ferrous Fumarate, atorvastatin, and alendronate.  Allergies as of 11/10/2020       Reactions   Keflex [cephalexin] Itching, Rash   Percocet [oxycodone-acetaminophen] Itching, Rash   Pt can take oxycodone and tylenol separately    Percodan [oxycodone-aspirin] Itching, Rash   Pt can take oxycodone and aspirin separately       Medication List       Accurate as of November 10, 2020 11:59 PM. If you have any questions, ask your nurse or doctor.        acetaminophen 500 MG tablet Commonly known  as: TYLENOL Take 500-1,000 mg by mouth daily as needed for headache.   alendronate 70 MG tablet Commonly known as: FOSAMAX TAKE 1 TABLET BY MOUTH EVERY 7 DAYS. TAKE WITH A FULL GLASS OF WATER ON AN EMPTY STOMACH.   atorvastatin 40 MG tablet Commonly known as: LIPITOR Take 1 tablet (40 mg total) by mouth daily. Take for cholesterol control   Ferrous Fumarate 324 (106 Fe) MG Tabs tablet Commonly known as: Ferrocite Take 1 tablet (106 mg of iron total) by mouth daily.   furosemide 40 MG tablet Commonly known as: LASIX Take 1 tablet (40 mg total) by mouth daily.   levothyroxine 125 MCG tablet Commonly known as: SYNTHROID Take 1 tablet (125 mcg total) by mouth daily.   meloxicam 15 MG tablet Commonly known as: MOBIC Take 1 tablet (15 mg total) by mouth daily with supper.   multivitamin with minerals Tabs tablet Take 1 tablet by mouth daily.   Naphazoline-Glycerin 0.03-0.5 % Soln Place 2 drops into both eyes 3 (three) times daily as needed (for redness/irritation.).   polyethylene glycol 17 g packet Commonly known as: MIRALAX / GLYCOLAX Take 17 g by mouth daily as needed for mild constipation.   potassium chloride 10 MEQ CR capsule Commonly known as: MICRO-K TAKE 1 CAPSULE BY MOUTH EVERY DAY        Follow-up: Return in about 6 months (around 05/10/2021).  Claretta Fraise, M.D.

## 2020-11-14 ENCOUNTER — Other Ambulatory Visit: Payer: Self-pay

## 2020-11-14 ENCOUNTER — Other Ambulatory Visit: Payer: 59

## 2020-11-14 ENCOUNTER — Encounter: Payer: Self-pay | Admitting: Family Medicine

## 2020-11-15 LAB — CBC WITH DIFFERENTIAL/PLATELET
Basophils Absolute: 0.1 10*3/uL (ref 0.0–0.2)
Basos: 1 %
EOS (ABSOLUTE): 0.1 10*3/uL (ref 0.0–0.4)
Eos: 1 %
Hematocrit: 41.4 % (ref 34.0–46.6)
Hemoglobin: 14.1 g/dL (ref 11.1–15.9)
Immature Grans (Abs): 0 10*3/uL (ref 0.0–0.1)
Immature Granulocytes: 0 %
Lymphocytes Absolute: 2.6 10*3/uL (ref 0.7–3.1)
Lymphs: 28 %
MCH: 31.5 pg (ref 26.6–33.0)
MCHC: 34.1 g/dL (ref 31.5–35.7)
MCV: 93 fL (ref 79–97)
Monocytes Absolute: 0.7 10*3/uL (ref 0.1–0.9)
Monocytes: 8 %
Neutrophils Absolute: 5.8 10*3/uL (ref 1.4–7.0)
Neutrophils: 62 %
Platelets: 238 10*3/uL (ref 150–450)
RBC: 4.47 x10E6/uL (ref 3.77–5.28)
RDW: 11.6 % — ABNORMAL LOW (ref 11.7–15.4)
WBC: 9.2 10*3/uL (ref 3.4–10.8)

## 2020-11-15 LAB — CMP14+EGFR
ALT: 24 IU/L (ref 0–32)
AST: 18 IU/L (ref 0–40)
Albumin/Globulin Ratio: 2.1 (ref 1.2–2.2)
Albumin: 4.6 g/dL (ref 3.8–4.9)
Alkaline Phosphatase: 55 IU/L (ref 44–121)
BUN/Creatinine Ratio: 39 — ABNORMAL HIGH (ref 9–23)
BUN: 22 mg/dL (ref 6–24)
Bilirubin Total: 0.4 mg/dL (ref 0.0–1.2)
CO2: 24 mmol/L (ref 20–29)
Calcium: 10.4 mg/dL — ABNORMAL HIGH (ref 8.7–10.2)
Chloride: 104 mmol/L (ref 96–106)
Creatinine, Ser: 0.57 mg/dL (ref 0.57–1.00)
GFR calc Af Amer: 117 mL/min/{1.73_m2} (ref >59)
GFR calc non Af Amer: 102 mL/min/{1.73_m2} (ref >59)
Globulin, Total: 2.2 g/dL (ref 1.5–4.5)
Glucose: 91 mg/dL (ref 65–99)
Potassium: 4.7 mmol/L (ref 3.5–5.2)
Sodium: 145 mmol/L — ABNORMAL HIGH (ref 134–144)
Total Protein: 6.8 g/dL (ref 6.0–8.5)

## 2020-11-15 LAB — LIPID PANEL
Chol/HDL Ratio: 3.1 ratio (ref 0.0–4.4)
Cholesterol, Total: 162 mg/dL (ref 100–199)
HDL: 52 mg/dL (ref >39)
LDL Chol Calc (NIH): 88 mg/dL (ref 0–99)
Triglycerides: 122 mg/dL (ref 0–149)
VLDL Cholesterol Cal: 22 mg/dL (ref 5–40)

## 2020-11-15 LAB — TSH+FREE T4
Free T4: 1.68 ng/dL (ref 0.82–1.77)
TSH: 1.67 u[IU]/mL (ref 0.450–4.500)

## 2020-11-15 NOTE — Progress Notes (Signed)
Hello [name],  Your lab result is normal and/or stable.Some minor variations that are not significant are commonly marked abnormal, but do not represent any medical problem for you.  Best regards, Bralin Garry, M.D.

## 2021-01-17 ENCOUNTER — Other Ambulatory Visit: Payer: Self-pay | Admitting: Family Medicine

## 2021-01-17 DIAGNOSIS — R600 Localized edema: Secondary | ICD-10-CM

## 2021-02-22 ENCOUNTER — Other Ambulatory Visit: Payer: Self-pay | Admitting: Family Medicine

## 2021-02-22 DIAGNOSIS — R600 Localized edema: Secondary | ICD-10-CM

## 2021-03-24 ENCOUNTER — Other Ambulatory Visit: Payer: Self-pay | Admitting: Family Medicine

## 2021-05-10 ENCOUNTER — Other Ambulatory Visit: Payer: Self-pay

## 2021-05-10 ENCOUNTER — Ambulatory Visit (INDEPENDENT_AMBULATORY_CARE_PROVIDER_SITE_OTHER): Payer: 59 | Admitting: Family Medicine

## 2021-05-10 ENCOUNTER — Encounter: Payer: Self-pay | Admitting: Family Medicine

## 2021-05-10 VITALS — BP 121/74 | HR 58 | Temp 97.6°F | Ht 64.0 in | Wt 202.2 lb

## 2021-05-10 DIAGNOSIS — E038 Other specified hypothyroidism: Secondary | ICD-10-CM | POA: Diagnosis not present

## 2021-05-10 DIAGNOSIS — E782 Mixed hyperlipidemia: Secondary | ICD-10-CM

## 2021-05-10 DIAGNOSIS — R600 Localized edema: Secondary | ICD-10-CM

## 2021-05-10 DIAGNOSIS — E559 Vitamin D deficiency, unspecified: Secondary | ICD-10-CM | POA: Diagnosis not present

## 2021-05-10 DIAGNOSIS — D509 Iron deficiency anemia, unspecified: Secondary | ICD-10-CM

## 2021-05-10 DIAGNOSIS — M810 Age-related osteoporosis without current pathological fracture: Secondary | ICD-10-CM

## 2021-05-10 MED ORDER — FUROSEMIDE 40 MG PO TABS: 40.0000 mg | ORAL_TABLET | Freq: Every day | ORAL | 3 refills | Status: DC

## 2021-05-10 MED ORDER — ALENDRONATE SODIUM 70 MG PO TABS: ORAL_TABLET | ORAL | 3 refills | Status: DC

## 2021-05-10 MED ORDER — ATORVASTATIN CALCIUM 40 MG PO TABS: 40.0000 mg | ORAL_TABLET | Freq: Every day | ORAL | 3 refills | Status: DC

## 2021-05-10 MED ORDER — POTASSIUM CHLORIDE ER 10 MEQ PO CPCR: 10.0000 meq | ORAL_CAPSULE | Freq: Every day | ORAL | 3 refills | Status: DC

## 2021-05-10 MED ORDER — LEVOTHYROXINE SODIUM 125 MCG PO TABS: 125.0000 ug | ORAL_TABLET | Freq: Every day | ORAL | 3 refills | Status: DC

## 2021-05-10 MED ORDER — FERROUS FUMARATE 324 (106 FE) MG PO TABS: 1.0000 | ORAL_TABLET | Freq: Every day | ORAL | 3 refills | Status: DC

## 2021-05-10 MED ORDER — MELOXICAM 15 MG PO TABS: 15.0000 mg | ORAL_TABLET | Freq: Every day | ORAL | 3 refills | Status: DC

## 2021-05-10 NOTE — Progress Notes (Signed)
Subjective:  Patient ID: Makayla Gibson, female    DOB: [birth year]  Age: 60 y.o. MRN: [id number]  CC: Medical Management of Chronic Issues   HPI Makayla Gibson presents for  follow-up on  thyroid. The patient has a history of hypothyroidism for many years. It has been stable recently. Pt. denies any change in  voice, loss of hair, heat or cold intolerance. Energy level has been adequate to good. Patient denies constipation and diarrhea. No myxedema. Medication is as noted below. Verified that pt is taking it daily on an empty stomach. Well tolerated.  .Taking fosamax for bone health   in for follow-up of elevated cholesterol. Doing well without complaints on current medication. Denies side effects of statin including myalgia and arthralgia and nausea. Currently no chest pain, shortness of breath or other cardiovascular related symptoms noted.  Hx of anemia. Due for bloodwork. Taking iron.  Depression screen [facility] 2/9 05/10/2021 05/10/2021 11/10/2020  Decreased Interest 0 0 0  Down, Depressed, Hopeless 1 0 0  PHQ - 2 Score 1 0 0  Altered sleeping 0 - -  Tired, decreased energy 1 - -  Change in appetite 0 - -  Feeling bad or failure about yourself  0 - -  Trouble concentrating 0 - -  Moving slowly or fidgety/restless 0 - -  Suicidal thoughts - - -  PHQ-9 Score 2 - -  Difficult doing work/chores Not difficult at all - -    History Makayla Gibson has a past medical history of Abscess of breast ([id number]), Anemia, Arthritis, Heart murmur, Hyperthyroidism, Osteoporosis, Sleep apnea, and Thyroid disease (11/2014).   She has a past surgical history that includes Cesarean section; Appendectomy; Incision and drainage abscess (Right, 07/29/2015); Breast cyst excision (Right, 09/06/2016); Mastectomy, partial (Right, 09/06/2016); Breast lumpectomy (Left, 01/29/2018); Debridement and closure wound (Left, 01/29/2018); and Breast reduction surgery (Left, 01/29/2018).   Her family history includes Alcohol  abuse in her father; Cancer in her brother; Heart disease in her brother and mother; Osteoporosis in her brother and mother; Rheumatic fever in her mother.She reports that she quit smoking about 12 years ago. She has never used smokeless tobacco. She reports that she does not currently use alcohol. She reports that she does not use drugs.    ROS Review of Systems  Constitutional: Negative.   HENT: Negative.    Eyes:  Negative for visual disturbance.  Respiratory:  Negative for shortness of breath.   Cardiovascular:  Negative for chest pain.  Gastrointestinal:  Negative for abdominal pain.  Musculoskeletal:  Negative for arthralgias.   Objective:  BP 121/74   Pulse (!) 58   Temp 97.6 F (36.4 C)   Ht 5' 4" (1.626 m)   Wt 202 lb 3.2 oz (91.7 kg)   SpO2 95%   BMI 34.71 kg/m   BP Readings from Last 3 Encounters:  05/10/21 121/74  11/10/20 138/83  05/10/20 115/65    Wt Readings from Last 3 Encounters:  05/10/21 202 lb 3.2 oz (91.7 kg)  11/10/20 210 lb 12.8 oz (95.6 kg)  05/10/20 222 lb 12.8 oz (101.1 kg)     Physical Exam Constitutional:      General: She is not in acute distress.    Appearance: She is well-developed.  HENT:     Head: Normocephalic and atraumatic.  Eyes:     Conjunctiva/sclera: Conjunctivae normal.     Pupils: Pupils are equal, round, and reactive to light.  Neck:     Thyroid: No thyromegaly.  Cardiovascular:     Rate and Rhythm: Normal rate and regular rhythm.     Heart sounds: Normal heart sounds. No murmur heard. Pulmonary:     Effort: Pulmonary effort is normal. No respiratory distress.     Breath sounds: Normal breath sounds. No wheezing or rales.  Abdominal:     General: Bowel sounds are normal. There is no distension.     Palpations: Abdomen is soft.     Tenderness: There is no abdominal tenderness.  Musculoskeletal:        General: Normal range of motion.     Cervical back: Normal range of motion and neck supple.  Lymphadenopathy:      Cervical: No cervical adenopathy.  Skin:    General: Skin is warm and dry.  Neurological:     Mental Status: She is alert and oriented to person, place, and time.  Psychiatric:        Behavior: Behavior normal.        Thought Content: Thought content normal.        Judgment: Judgment normal.      Assessment & Plan:   Makayla Gibson was seen today for medical management of chronic issues.  Diagnoses and all orders for this visit:  Other specified hypothyroidism -     TSH + free T4 -     levothyroxine (SYNTHROID) 125 MCG tablet; Take 1 tablet (125 mcg total) by mouth daily.  Mixed hyperlipidemia -     CMP14+EGFR -     Lipid panel -     atorvastatin (LIPITOR) 40 MG tablet; Take 1 tablet (40 mg total) by mouth daily. Take for cholesterol control  Vitamin D deficiency -     VITAMIN D 25 Hydroxy (Vit-D Deficiency, Fractures)  Iron deficiency anemia, unspecified iron deficiency anemia type -     CBC with Differential/Platelet -     Ferrous Fumarate (FERROCITE) 324 (106 Fe) MG TABS tablet; Take 1 tablet (106 mg of iron total) by mouth daily. -     Iron, TIBC and Ferritin Panel  Edema of both legs -     furosemide (LASIX) 40 MG tablet; Take 1 tablet (40 mg total) by mouth daily. -     potassium chloride (MICRO-K) 10 MEQ CR capsule; Take 1 capsule (10 mEq total) by mouth daily. TAKE 1 CAPSULE BY MOUTH EVERY DAY  Age-related osteoporosis without current pathological fracture  Other orders -     alendronate (FOSAMAX) 70 MG tablet; Take with a full glass of water on an empty stomach. -     meloxicam (MOBIC) 15 MG tablet; Take 1 tablet (15 mg total) by mouth daily with supper.      I have changed Makayla Crigler. Gibson's alendronate and potassium chloride. I am also having her maintain her multivitamin with minerals, Naphazoline-Glycerin, acetaminophen, polyethylene glycol, atorvastatin, Ferrous Fumarate, furosemide, levothyroxine, and meloxicam.  Allergies as of 05/10/2021        Reactions   Keflex [cephalexin] Itching, Rash   Percocet [oxycodone-acetaminophen] Itching, Rash   Pt can take oxycodone and tylenol separately    Percodan [oxycodone-aspirin] Itching, Rash   Pt can take oxycodone and aspirin separately         Medication List        Accurate as of May 10, 2021 11:59 PM. If you have any questions, ask your nurse or doctor.          acetaminophen 500 MG tablet Commonly known as: TYLENOL Take 500-1,000 mg by mouth daily  as needed for headache.   alendronate 70 MG tablet Commonly known as: FOSAMAX Take with a full glass of water on an empty stomach. What changed: See the new instructions. Changed by: Claretta Fraise, MD   atorvastatin 40 MG tablet Commonly known as: LIPITOR Take 1 tablet (40 mg total) by mouth daily. Take for cholesterol control   Ferrous Fumarate 324 (106 Fe) MG Tabs tablet Commonly known as: Ferrocite Take 1 tablet (106 mg of iron total) by mouth daily.   furosemide 40 MG tablet Commonly known as: LASIX Take 1 tablet (40 mg total) by mouth daily.   levothyroxine 125 MCG tablet Commonly known as: SYNTHROID Take 1 tablet (125 mcg total) by mouth daily.   meloxicam 15 MG tablet Commonly known as: MOBIC Take 1 tablet (15 mg total) by mouth daily with supper.   multivitamin with minerals Tabs tablet Take 1 tablet by mouth daily.   Naphazoline-Glycerin 0.03-0.5 % Soln Place 2 drops into both eyes 3 (three) times daily as needed (for redness/irritation.).   polyethylene glycol 17 g packet Commonly known as: MIRALAX / GLYCOLAX Take 17 g by mouth daily as needed for mild constipation.   potassium chloride 10 MEQ CR capsule Commonly known as: MICRO-K Take 1 capsule (10 mEq total) by mouth daily. TAKE 1 CAPSULE BY MOUTH EVERY DAY What changed:  how much to take additional instructions Changed by: Claretta Fraise, MD         Follow-up: Return in about 6 months (around 11/10/2021).  Claretta Fraise, M.D.

## 2021-05-11 LAB — CBC WITH DIFFERENTIAL/PLATELET
Basophils Absolute: 0.1 10*3/uL (ref 0.0–0.2)
Basos: 1 %
EOS (ABSOLUTE): 0.1 10*3/uL (ref 0.0–0.4)
Eos: 1 %
Hematocrit: 40.1 % (ref 34.0–46.6)
Hemoglobin: 14 g/dL (ref 11.1–15.9)
Immature Grans (Abs): 0 10*3/uL (ref 0.0–0.1)
Immature Granulocytes: 0 %
Lymphocytes Absolute: 2 10*3/uL (ref 0.7–3.1)
Lymphs: 23 %
MCH: 32.3 pg (ref 26.6–33.0)
MCHC: 34.9 g/dL (ref 31.5–35.7)
MCV: 93 fL (ref 79–97)
Monocytes Absolute: 0.6 10*3/uL (ref 0.1–0.9)
Monocytes: 6 %
Neutrophils Absolute: 6 10*3/uL (ref 1.4–7.0)
Neutrophils: 69 %
Platelets: 232 10*3/uL (ref 150–450)
RBC: 4.33 x10E6/uL (ref 3.77–5.28)
RDW: 11.8 % (ref 11.7–15.4)
WBC: 8.8 10*3/uL (ref 3.4–10.8)

## 2021-05-11 LAB — CMP14+EGFR
ALT: 25 IU/L (ref 0–32)
AST: 18 IU/L (ref 0–40)
Albumin/Globulin Ratio: 2.3 — ABNORMAL HIGH (ref 1.2–2.2)
Albumin: 4.5 g/dL (ref 3.8–4.9)
Alkaline Phosphatase: 54 IU/L (ref 44–121)
BUN/Creatinine Ratio: 43 — ABNORMAL HIGH (ref 9–23)
BUN: 23 mg/dL (ref 6–24)
Bilirubin Total: 0.4 mg/dL (ref 0.0–1.2)
CO2: 25 mmol/L (ref 20–29)
Calcium: 9.1 mg/dL (ref 8.7–10.2)
Chloride: 105 mmol/L (ref 96–106)
Creatinine, Ser: 0.53 mg/dL — ABNORMAL LOW (ref 0.57–1.00)
Globulin, Total: 2 g/dL (ref 1.5–4.5)
Glucose: 96 mg/dL (ref 65–99)
Potassium: 4.9 mmol/L (ref 3.5–5.2)
Sodium: 145 mmol/L — ABNORMAL HIGH (ref 134–144)
Total Protein: 6.5 g/dL (ref 6.0–8.5)
eGFR: 106 mL/min/{1.73_m2} (ref >59)

## 2021-05-11 LAB — TSH+FREE T4
Free T4: 1.47 ng/dL (ref 0.82–1.77)
TSH: 1.74 u[IU]/mL (ref 0.450–4.500)

## 2021-05-11 LAB — LIPID PANEL
Chol/HDL Ratio: 2.8 ratio (ref 0.0–4.4)
Cholesterol, Total: 145 mg/dL (ref 100–199)
HDL: 51 mg/dL (ref >39)
LDL Chol Calc (NIH): 57 mg/dL (ref 0–99)
Triglycerides: 229 mg/dL — ABNORMAL HIGH (ref 0–149)
VLDL Cholesterol Cal: 37 mg/dL (ref 5–40)

## 2021-05-11 LAB — IRON,TIBC AND FERRITIN PANEL
Ferritin: 685 ng/mL — ABNORMAL HIGH (ref 15–150)
Iron Saturation: 25 % (ref 15–55)
Iron: 62 ug/dL (ref 27–159)
Total Iron Binding Capacity: 244 ug/dL — ABNORMAL LOW (ref 250–450)
UIBC: 182 ug/dL (ref 131–425)

## 2021-05-11 LAB — VITAMIN D 25 HYDROXY (VIT D DEFICIENCY, FRACTURES): Vit D, 25-Hydroxy: 30 ng/mL (ref 30.0–100.0)

## 2021-05-15 ENCOUNTER — Encounter: Payer: Self-pay | Admitting: Family Medicine

## 2021-05-29 ENCOUNTER — Telehealth: Payer: Self-pay | Admitting: Family Medicine

## 2021-05-29 NOTE — Telephone Encounter (Signed)
  Prescription Request  05/29/2021  Is this a "Controlled Substance" medicine? Have you seen your PCP in the last 2 weeks? If YES, route message to pool  -  If NO, patient needs to be seen.  What is the name of the medication or equipment? Needs CPAP mask, hose that connects to the CPAP, and the strap that goes over your head and connects to the top and side of the mask and air filter.Also needs the water tub.Patient has had this since 2016.  Have you contacted your pharmacy to request a refill? NO.  Which pharmacy would you like this sent to? Send to [facility] P# [phone]   Patient notified that their request is being sent to the clinical staff for review and that they should receive a response within 2 business days.

## 2021-05-30 NOTE — Telephone Encounter (Signed)
Pt's sleep study was done in [place] 12[place]2014, gave her the phone number to the [facility] to get her study and made an appt w/ Dr. Livia Snellen to discuss her OSA so there are notes about her OSA so that orders for supplies can be done

## 2021-06-22 ENCOUNTER — Other Ambulatory Visit: Payer: Self-pay

## 2021-06-22 ENCOUNTER — Encounter: Payer: Self-pay | Admitting: Family Medicine

## 2021-06-22 ENCOUNTER — Ambulatory Visit (INDEPENDENT_AMBULATORY_CARE_PROVIDER_SITE_OTHER): Payer: 59 | Admitting: Family Medicine

## 2021-06-22 VITALS — BP 124/73 | HR 82 | Temp 97.5°F | Ht 64.0 in | Wt 197.4 lb

## 2021-06-22 DIAGNOSIS — G4733 Obstructive sleep apnea (adult) (pediatric): Secondary | ICD-10-CM

## 2021-06-23 ENCOUNTER — Telehealth: Payer: Self-pay | Admitting: Family Medicine

## 2021-06-23 NOTE — Telephone Encounter (Signed)
Pt called back to let Dr Livia Snellen know that she needs him to send her medical supply order for CPAP machine to [facility] (fax# [phone])  Pt would like to be called once the order has been sent.

## 2021-06-26 NOTE — Telephone Encounter (Signed)
LMOVM I sent community message to [place] with [facility].

## 2021-06-27 ENCOUNTER — Encounter: Payer: Self-pay | Admitting: Family Medicine

## 2021-06-27 NOTE — Progress Notes (Signed)
No chief complaint on file.   HPI  Patient presents today for sleep apnea. She uses CPAP and needs new supplies to continue treatment. It is working well for her overall but her tubing and mask have begun to leak.   PMH: Smoking status noted ROS: Per HPI  Objective: BP 124/73   Pulse 82   Temp (!) 97.5 F (36.4 C)   Ht 5\' 4"  (1.626 m)   Wt 197 lb 6.4 oz (89.5 kg)   SpO2 94%   BMI 33.88 kg/m  Gen: NAD, alert, cooperative with exam HEENT: NCAT, EOMI, PERRL CV: RRR, good S1/S2, no murmur Resp: CTABL, no wheezes, non-labored Abd: SNTND, BS present, no guarding or organomegaly Ext: No edema, warm Neuro: Alert and oriented, No gross deficits  Assessment and plan:  1. Sleep apnea, obstructive     No orders of the defined types were placed in this encounter.   Orders Placed This Encounter  Procedures   For home use only DME Other see comment    CPAP mask, tubing, water chamber and  air filter Dx: Z61.09    Order Specific Question:   Length of Need    Answer:   Lifetime    Follow up as needed.  Claretta Fraise, MD

## 2021-06-28 NOTE — Telephone Encounter (Signed)
This was done last week, right?

## 2021-07-05 ENCOUNTER — Telehealth: Payer: Self-pay | Admitting: *Deleted

## 2021-07-05 NOTE — Telephone Encounter (Signed)
Pt called in checking on her CPAP supply order which I sent to [place] by community message last Monday In calling [facility] and talked to [facility], they only have her as having oxygen with them. She had a 1 mos rental on a Bipap machine back in 2016, or cash paid but that was it, nothing else since. They will need sleep study and titration. Pt did have sleep study done in [place] in May 2016 at [facility]. Will fax records release request to them at [phone] to try to get these records. Pt aware to come to the office to sign a records release for [place] to get her sleep study.

## 2021-10-20 ENCOUNTER — Telehealth: Payer: Self-pay | Admitting: Family Medicine

## 2021-10-23 NOTE — Telephone Encounter (Signed)
Pt needs handwritten order to DC her oxygen faxed to [place]: [place] for them to pick it up. Also needing to get CPAP supplies. We have still not gotten her sleep study from [facility] in [place] from 2016. Pt will stop by and sign another record release form.

## 2021-10-31 NOTE — Telephone Encounter (Signed)
TC to [place] w/ [facility] DC order for Oxygen was received & pt's account has been closed  Also put a call into [place] [phone] medical records to see if they received the records release form so that we may obtain the pt's sleep study results from 01/2015

## 2021-11-01 ENCOUNTER — Ambulatory Visit (INDEPENDENT_AMBULATORY_CARE_PROVIDER_SITE_OTHER): Payer: 59 | Admitting: Family Medicine

## 2021-11-01 ENCOUNTER — Encounter: Payer: Self-pay | Admitting: Family Medicine

## 2021-11-01 VITALS — BP 129/74 | HR 56 | Temp 97.1°F | Ht 64.0 in | Wt 201.0 lb

## 2021-11-01 DIAGNOSIS — E038 Other specified hypothyroidism: Secondary | ICD-10-CM | POA: Diagnosis not present

## 2021-11-01 DIAGNOSIS — E782 Mixed hyperlipidemia: Secondary | ICD-10-CM

## 2021-11-01 DIAGNOSIS — D509 Iron deficiency anemia, unspecified: Secondary | ICD-10-CM | POA: Diagnosis not present

## 2021-11-02 LAB — CMP14+EGFR
ALT: 26 IU/L (ref 0–32)
AST: 20 IU/L (ref 0–40)
Albumin/Globulin Ratio: 1.8 (ref 1.2–2.2)
Albumin: 4.4 g/dL (ref 3.8–4.9)
Alkaline Phosphatase: 50 IU/L (ref 44–121)
BUN/Creatinine Ratio: 38 — ABNORMAL HIGH (ref 9–23)
BUN: 23 mg/dL (ref 6–24)
Bilirubin Total: 0.6 mg/dL (ref 0.0–1.2)
CO2: 25 mmol/L (ref 20–29)
Calcium: 9.9 mg/dL (ref 8.7–10.2)
Chloride: 103 mmol/L (ref 96–106)
Creatinine, Ser: 0.61 mg/dL (ref 0.57–1.00)
Globulin, Total: 2.5 g/dL (ref 1.5–4.5)
Glucose: 87 mg/dL (ref 70–99)
Potassium: 4.4 mmol/L (ref 3.5–5.2)
Sodium: 143 mmol/L (ref 134–144)
Total Protein: 6.9 g/dL (ref 6.0–8.5)
eGFR: 103 mL/min/{1.73_m2} (ref >59)

## 2021-11-02 LAB — CBC WITH DIFFERENTIAL/PLATELET
Basophils Absolute: 0.1 10*3/uL (ref 0.0–0.2)
Basos: 1 %
EOS (ABSOLUTE): 0.1 10*3/uL (ref 0.0–0.4)
Eos: 1 %
Hematocrit: 42 % (ref 34.0–46.6)
Hemoglobin: 14.6 g/dL (ref 11.1–15.9)
Immature Grans (Abs): 0 10*3/uL (ref 0.0–0.1)
Immature Granulocytes: 0 %
Lymphocytes Absolute: 2.2 10*3/uL (ref 0.7–3.1)
Lymphs: 27 %
MCH: 31.8 pg (ref 26.6–33.0)
MCHC: 34.8 g/dL (ref 31.5–35.7)
MCV: 92 fL (ref 79–97)
Monocytes Absolute: 0.6 10*3/uL (ref 0.1–0.9)
Monocytes: 7 %
Neutrophils Absolute: 5.2 10*3/uL (ref 1.4–7.0)
Neutrophils: 64 %
Platelets: 218 10*3/uL (ref 150–450)
RBC: 4.59 x10E6/uL (ref 3.77–5.28)
RDW: 12 % (ref 11.7–15.4)
WBC: 8.2 10*3/uL (ref 3.4–10.8)

## 2021-11-02 LAB — TSH+FREE T4
Free T4: 1.64 ng/dL (ref 0.82–1.77)
TSH: 1.36 u[IU]/mL (ref 0.450–4.500)

## 2021-11-03 ENCOUNTER — Other Ambulatory Visit: Payer: Self-pay | Admitting: Family Medicine

## 2021-11-03 DIAGNOSIS — Z1231 Encounter for screening mammogram for malignant neoplasm of breast: Secondary | ICD-10-CM

## 2021-11-03 NOTE — Progress Notes (Signed)
Subjective:  Patient ID: Makayla Gibson, female    DOB: 09-25-1961  Age: 61 y.o. MRN: [id number]  CC: Medical Management of Chronic Issues   HPI Makayla Gibson presents for  follow-up on  thyroid. The patient has a history of hypothyroidism for many years. It has been stable recently. Pt. denies any change in  voice, loss of hair, heat or cold intolerance. Energy level has been adequate to good. Patient denies constipation and diarrhea. No myxedema. Medication is as noted below. Verified that pt is taking it daily on an empty stomach. Well tolerated.  in for follow-up of elevated cholesterol. Doing well without complaints on current medication. Denies side effects of statin including myalgia and arthralgia and nausea. Currently no chest pain, shortness of breath or other cardiovascular related symptoms noted.    Depression screen [facility] 2/9 11/01/2021 05/10/2021 05/10/2021  Decreased Interest 0 0 0  Down, Depressed, Hopeless 0 1 0  PHQ - 2 Score 0 1 0  Altered sleeping - 0 -  Tired, decreased energy - 1 -  Change in appetite - 0 -  Feeling bad or failure about yourself  - 0 -  Trouble concentrating - 0 -  Moving slowly or fidgety/restless - 0 -  Suicidal thoughts - - -  PHQ-9 Score - 2 -  Difficult doing work/chores - Not difficult at all -    History Makayla Gibson has a past medical history of Abscess of breast ([id number]), Anemia, Arthritis, Heart murmur, Hyperthyroidism, Osteoporosis, Sleep apnea, and Thyroid disease (11/2014).   She has a past surgical history that includes Cesarean section; Appendectomy; Incision and drainage abscess (Right, 07/29/2015); Breast cyst excision (Right, 09/06/2016); Mastectomy, partial (Right, 09/06/2016); Breast lumpectomy (Left, 01/29/2018); Debridement and closure wound (Left, 01/29/2018); and Breast reduction surgery (Left, 01/29/2018).   Her family history includes Alcohol abuse in her father; Cancer in her brother; Heart disease in her brother and mother;  Osteoporosis in her brother and mother; Rheumatic fever in her mother.She reports that she quit smoking about 13 years ago. Her smoking use included cigarettes. She has never used smokeless tobacco. She reports that she does not currently use alcohol. She reports that she does not use drugs.    ROS Review of Systems  Constitutional: Negative.   HENT: Negative.    Eyes:  Negative for visual disturbance.  Respiratory:  Negative for shortness of breath.   Cardiovascular:  Negative for chest pain.  Gastrointestinal:  Negative for abdominal pain.  Musculoskeletal:  Negative for arthralgias.   Objective:  BP 129/74    Pulse (!) 56    Temp (!) 97.1 F (36.2 C)    Ht _0  (1.626 m)    Wt 201 lb (91.2 kg)    SpO2 95%    BMI 34.50 kg/m   BP Readings from Last 3 Encounters:  11/01/21 129/74  06/22/21 124/73  05/10/21 121/74    Wt Readings from Last 3 Encounters:  11/01/21 201 lb (91.2 kg)  06/22/21 197 lb 6.4 oz (89.5 kg)  05/10/21 202 lb 3.2 oz (91.7 kg)     Physical Exam Constitutional:      General: She is not in acute distress.    Appearance: She is well-developed.  Cardiovascular:     Rate and Rhythm: Normal rate and regular rhythm.  Pulmonary:     Breath sounds: Normal breath sounds.  Musculoskeletal:        General: Normal range of motion.  Skin:    General: Skin is  warm and dry.  Neurological:     Mental Status: She is alert and oriented to person, place, and time.      Assessment & Plan:   Makayla Gibson was seen today for medical management of chronic issues.  Diagnoses and all orders for this visit:  Other specified hypothyroidism -     CBC with Differential/Platelet -     CMP14+EGFR -     TSH + free T4  Iron deficiency anemia, unspecified iron deficiency anemia type  Mixed hyperlipidemia -     Lipid panel       I am having Makayla Gibson. Makayla Gibson maintain her multivitamin with minerals, Naphazoline-Glycerin, acetaminophen, polyethylene glycol,  alendronate, atorvastatin, furosemide, potassium chloride, levothyroxine, and meloxicam.  Allergies as of 11/01/2021       Reactions   Keflex [cephalexin] Itching, Rash   Percocet [oxycodone-acetaminophen] Itching, Rash   Pt can take oxycodone and tylenol separately    Percodan [oxycodone-aspirin] Itching, Rash   Pt can take oxycodone and aspirin separately         Medication List        Accurate as of November 01, 2021 11:59 PM. If you have any questions, ask your nurse or doctor.          acetaminophen 500 MG tablet Commonly known as: TYLENOL Take 500-1,000 mg by mouth daily as needed for headache.   alendronate 70 MG tablet Commonly known as: FOSAMAX Take with a full glass of water on an empty stomach.   atorvastatin 40 MG tablet Commonly known as: LIPITOR Take 1 tablet (40 mg total) by mouth daily. Take for cholesterol control   furosemide 40 MG tablet Commonly known as: LASIX Take 1 tablet (40 mg total) by mouth daily.   levothyroxine 125 MCG tablet Commonly known as: SYNTHROID Take 1 tablet (125 mcg total) by mouth daily.   meloxicam 15 MG tablet Commonly known as: MOBIC Take 1 tablet (15 mg total) by mouth daily with supper.   multivitamin with minerals Tabs tablet Take 1 tablet by mouth daily.   Naphazoline-Glycerin 0.03-0.5 % Soln Place 2 drops into both eyes 3 (three) times daily as needed (for redness/irritation.).   polyethylene glycol 17 g packet Commonly known as: MIRALAX / GLYCOLAX Take 17 g by mouth daily as needed for mild constipation.   potassium chloride 10 MEQ CR capsule Commonly known as: MICRO-K Take 1 capsule (10 mEq total) by mouth daily. TAKE 1 CAPSULE BY MOUTH EVERY DAY         Follow-up: No follow-ups on file.  Claretta Fraise, M.D.

## 2021-11-22 ENCOUNTER — Other Ambulatory Visit: Payer: Self-pay

## 2021-11-22 ENCOUNTER — Ambulatory Visit: Admission: RE | Admit: 2021-11-22 | Payer: Self-pay | Source: Ambulatory Visit

## 2022-04-16 ENCOUNTER — Other Ambulatory Visit: Payer: Self-pay | Admitting: Family Medicine

## 2022-05-01 ENCOUNTER — Encounter: Payer: Self-pay | Admitting: Family Medicine

## 2022-05-01 ENCOUNTER — Ambulatory Visit (INDEPENDENT_AMBULATORY_CARE_PROVIDER_SITE_OTHER): Payer: 59 | Admitting: Family Medicine

## 2022-05-01 VITALS — BP 124/70 | HR 51 | Temp 98.6°F | Ht 64.0 in | Wt 206.4 lb

## 2022-05-01 DIAGNOSIS — R600 Localized edema: Secondary | ICD-10-CM

## 2022-05-01 DIAGNOSIS — E782 Mixed hyperlipidemia: Secondary | ICD-10-CM

## 2022-05-01 DIAGNOSIS — E038 Other specified hypothyroidism: Secondary | ICD-10-CM | POA: Diagnosis not present

## 2022-05-01 DIAGNOSIS — D509 Iron deficiency anemia, unspecified: Secondary | ICD-10-CM | POA: Diagnosis not present

## 2022-05-01 MED ORDER — LEVOTHYROXINE SODIUM 125 MCG PO TABS: 125.0000 ug | ORAL_TABLET | Freq: Every day | ORAL | 3 refills | Status: DC

## 2022-05-01 MED ORDER — POTASSIUM CHLORIDE ER 10 MEQ PO CPCR: 10.0000 meq | ORAL_CAPSULE | Freq: Every day | ORAL | 3 refills | Status: DC

## 2022-05-01 MED ORDER — ALENDRONATE SODIUM 70 MG PO TABS: ORAL_TABLET | ORAL | 3 refills | Status: DC

## 2022-05-01 MED ORDER — FUROSEMIDE 40 MG PO TABS: 40.0000 mg | ORAL_TABLET | Freq: Every day | ORAL | 3 refills | Status: DC

## 2022-05-01 MED ORDER — ATORVASTATIN CALCIUM 40 MG PO TABS: 40.0000 mg | ORAL_TABLET | Freq: Every day | ORAL | 3 refills | Status: DC

## 2022-05-01 NOTE — Progress Notes (Signed)
Subjective:  Patient ID: Makayla Gibson, female    DOB: October 05, 1960  Age: 61 y.o. MRN: [id number]  CC: Medical Management of Chronic Issues   HPI Makayla Gibson presents for  follow-up on  thyroid. The patient has a history of hypothyroidism for many years. It has been stable recently. Pt. denies any change in  voice, loss of hair, heat or cold intolerance. Energy level has been adequate to good. Patient denies constipation and diarrhea. No myxedema. Medication is as noted below. Verified that pt is taking it daily on an empty stomach. Well tolerated.  Stillusing CPAP. Can't seem to get [facility] to provide supplies.      05/01/2022    3:53 PM 11/01/2021    4:24 PM 05/10/2021    4:18 PM  Depression screen PHQ 2/9  Decreased Interest 0 0 0  Down, Depressed, Hopeless 0 0 1  PHQ - 2 Score 0 0 1  Altered sleeping   0  Tired, decreased energy   1  Change in appetite   0  Feeling bad or failure about yourself    0  Trouble concentrating   0  Moving slowly or fidgety/restless   0  PHQ-9 Score   2  Difficult doing work/chores   Not difficult at all    History Makayla Gibson has a past medical history of Abscess of breast ([id number]), Anemia, Arthritis, Heart murmur, Hyperthyroidism, Osteoporosis, Sleep apnea, and Thyroid disease (11/2014).   She has a past surgical history that includes Cesarean section; Appendectomy; Incision and drainage abscess (Right, 07/29/2015); Breast cyst excision (Right, 09/06/2016); Mastectomy, partial (Right, 09/06/2016); Breast lumpectomy (Left, 01/29/2018); Debridement and closure wound (Left, 01/29/2018); and Breast reduction surgery (Left, 01/29/2018).   Her family history includes Alcohol abuse in her father; Cancer in her brother; Heart disease in her brother and mother; Osteoporosis in her brother and mother; Rheumatic fever in her mother.She reports that she quit smoking about 13 years ago. Her smoking use included cigarettes. She has never used smokeless tobacco.  She reports that she does not currently use alcohol. She reports that she does not use drugs.    ROS Review of Systems  Constitutional: Negative.   HENT: Negative.    Eyes:  Negative for visual disturbance.  Respiratory:  Negative for shortness of breath.   Cardiovascular:  Negative for chest pain.  Gastrointestinal:  Negative for abdominal pain.  Musculoskeletal:  Negative for arthralgias.    Objective:  BP 124/70   Pulse (!) 51   Temp 98.6 F (37 C)   Ht 5\' 4"  (1.626 m)   Wt 206 lb 6.4 oz (93.6 kg)   SpO2 93%   BMI 35.43 kg/m   BP Readings from Last 3 Encounters:  05/01/22 124/70  11/01/21 129/74  06/22/21 124/73    Wt Readings from Last 3 Encounters:  05/01/22 206 lb 6.4 oz (93.6 kg)  11/01/21 201 lb (91.2 kg)  06/22/21 197 lb 6.4 oz (89.5 kg)     Physical Exam Constitutional:      General: She is not in acute distress.    Appearance: She is well-developed.  Cardiovascular:     Rate and Rhythm: Normal rate and regular rhythm.  Pulmonary:     Breath sounds: Normal breath sounds.  Musculoskeletal:        General: Normal range of motion.  Skin:    General: Skin is warm and dry.  Neurological:     Mental Status: She is alert and oriented to person, place,  and time.       Assessment & Plan:   Makayla Gibson was seen today for medical management of chronic issues.  Diagnoses and all orders for this visit:  Other specified hypothyroidism -     CBC with Differential/Platelet -     CMP14+EGFR -     TSH + free T4 -     levothyroxine (SYNTHROID) 125 MCG tablet; Take 1 tablet (125 mcg total) by mouth daily.  Mixed hyperlipidemia -     CBC with Differential/Platelet -     CMP14+EGFR -     Lipid panel -     atorvastatin (LIPITOR) 40 MG tablet; Take 1 tablet (40 mg total) by mouth daily. Take for cholesterol control  Iron deficiency anemia, unspecified iron deficiency anemia type -     CBC with Differential/Platelet -     Iron, TIBC and Ferritin  Panel  Edema of both legs -     furosemide (LASIX) 40 MG tablet; Take 1 tablet (40 mg total) by mouth daily. -     potassium chloride (MICRO-K) 10 MEQ CR capsule; Take 1 capsule (10 mEq total) by mouth daily. TAKE 1 CAPSULE BY MOUTH EVERY DAY  Other orders -     alendronate (FOSAMAX) 70 MG tablet; Take with a full glass of water on an empty stomach.       I am having Makayla Gibson. Min maintain her multivitamin with minerals, Naphazoline-Glycerin, acetaminophen, polyethylene glycol, meloxicam, alendronate, atorvastatin, furosemide, levothyroxine, and potassium chloride.  Allergies as of 05/01/2022       Reactions   Keflex [cephalexin] Itching, Rash   Percocet [oxycodone-acetaminophen] Itching, Rash   Pt can take oxycodone and tylenol separately    Percodan [oxycodone-aspirin] Itching, Rash   Pt can take oxycodone and aspirin separately         Medication List        Accurate as of May 01, 2022  4:44 PM. If you have any questions, ask your nurse or doctor.          acetaminophen 500 MG tablet Commonly known as: TYLENOL Take 500-1,000 mg by mouth daily as needed for headache.   alendronate 70 MG tablet Commonly known as: FOSAMAX Take with a full glass of water on an empty stomach.   atorvastatin 40 MG tablet Commonly known as: LIPITOR Take 1 tablet (40 mg total) by mouth daily. Take for cholesterol control   furosemide 40 MG tablet Commonly known as: LASIX Take 1 tablet (40 mg total) by mouth daily.   levothyroxine 125 MCG tablet Commonly known as: SYNTHROID Take 1 tablet (125 mcg total) by mouth daily.   meloxicam 15 MG tablet Commonly known as: MOBIC TAKE 1 TABLET (15 MG TOTAL) BY MOUTH DAILY WITH SUPPER.   multivitamin with minerals Tabs tablet Take 1 tablet by mouth daily.   Naphazoline-Glycerin 0.03-0.5 % Soln Place 2 drops into both eyes 3 (three) times daily as needed (for redness/irritation.).   polyethylene glycol 17 g packet Commonly  known as: MIRALAX / GLYCOLAX Take 17 g by mouth daily as needed for mild constipation.   potassium chloride 10 MEQ CR capsule Commonly known as: MICRO-K Take 1 capsule (10 mEq total) by mouth daily. TAKE 1 CAPSULE BY MOUTH EVERY DAY         Follow-up: Return in about 6 months (around 11/01/2022) for [facility] physical.  Claretta Fraise, M.D.

## 2022-05-02 LAB — CMP14+EGFR
ALT: 28 IU/L (ref 0–32)
AST: 20 IU/L (ref 0–40)
Albumin/Globulin Ratio: 1.8 (ref 1.2–2.2)
Albumin: 4.4 g/dL (ref 3.8–4.9)
Alkaline Phosphatase: 51 IU/L (ref 44–121)
BUN/Creatinine Ratio: 40 — ABNORMAL HIGH (ref 12–28)
BUN: 23 mg/dL (ref 8–27)
Bilirubin Total: 0.4 mg/dL (ref 0.0–1.2)
CO2: 26 mmol/L (ref 20–29)
Calcium: 10.5 mg/dL — ABNORMAL HIGH (ref 8.7–10.3)
Chloride: 105 mmol/L (ref 96–106)
Creatinine, Ser: 0.58 mg/dL (ref 0.57–1.00)
Globulin, Total: 2.4 g/dL (ref 1.5–4.5)
Glucose: 89 mg/dL (ref 70–99)
Potassium: 5 mmol/L (ref 3.5–5.2)
Sodium: 143 mmol/L (ref 134–144)
Total Protein: 6.8 g/dL (ref 6.0–8.5)
eGFR: 104 mL/min/{1.73_m2} (ref >59)

## 2022-05-02 LAB — CBC WITH DIFFERENTIAL/PLATELET
Basophils Absolute: 0.1 10*3/uL (ref 0.0–0.2)
Basos: 1 %
EOS (ABSOLUTE): 0.2 10*3/uL (ref 0.0–0.4)
Eos: 2 %
Hematocrit: 40.6 % (ref 34.0–46.6)
Hemoglobin: 13.9 g/dL (ref 11.1–15.9)
Immature Grans (Abs): 0.1 10*3/uL (ref 0.0–0.1)
Immature Granulocytes: 1 %
Lymphocytes Absolute: 2.5 10*3/uL (ref 0.7–3.1)
Lymphs: 29 %
MCH: 31.4 pg (ref 26.6–33.0)
MCHC: 34.2 g/dL (ref 31.5–35.7)
MCV: 92 fL (ref 79–97)
Monocytes Absolute: 0.7 10*3/uL (ref 0.1–0.9)
Monocytes: 8 %
Neutrophils Absolute: 5.3 10*3/uL (ref 1.4–7.0)
Neutrophils: 59 %
Platelets: 218 10*3/uL (ref 150–450)
RBC: 4.43 x10E6/uL (ref 3.77–5.28)
RDW: 12 % (ref 11.7–15.4)
WBC: 8.7 10*3/uL (ref 3.4–10.8)

## 2022-05-02 LAB — IRON,TIBC AND FERRITIN PANEL
Ferritin: 827 ng/mL — ABNORMAL HIGH (ref 15–150)
Iron Saturation: 34 % (ref 15–55)
Iron: 84 ug/dL (ref 27–159)
Total Iron Binding Capacity: 246 ug/dL — ABNORMAL LOW (ref 250–450)
UIBC: 162 ug/dL (ref 131–425)

## 2022-05-02 LAB — TSH+FREE T4
Free T4: 1.53 ng/dL (ref 0.82–1.77)
TSH: 1.79 u[IU]/mL (ref 0.450–4.500)

## 2022-05-02 LAB — LIPID PANEL
Chol/HDL Ratio: 3.1 ratio (ref 0.0–4.4)
Cholesterol, Total: 159 mg/dL (ref 100–199)
HDL: 52 mg/dL (ref >39)
LDL Chol Calc (NIH): 90 mg/dL (ref 0–99)
Triglycerides: 93 mg/dL (ref 0–149)
VLDL Cholesterol Cal: 17 mg/dL (ref 5–40)

## 2022-05-04 NOTE — Progress Notes (Signed)
Hello Lekeisha,  Your lab result is normal and/or stable.Some minor variations that are not significant are commonly marked abnormal, but do not represent any medical problem for you.  Best regards, Leaann Nevils, M.D.

## 2022-07-11 ENCOUNTER — Other Ambulatory Visit: Payer: Self-pay | Admitting: Family Medicine

## 2022-11-05 ENCOUNTER — Other Ambulatory Visit (HOSPITAL_COMMUNITY)
Admission: RE | Admit: 2022-11-05 | Discharge: 2022-11-05 | Disposition: A | Discharge status: Home / Self Care | Payer: 59 | Source: Ambulatory Visit | Attending: Family Medicine | Admitting: Family Medicine

## 2022-11-05 ENCOUNTER — Encounter: Payer: Self-pay | Admitting: Family Medicine

## 2022-11-05 ENCOUNTER — Ambulatory Visit: Payer: 59 | Admitting: Family Medicine

## 2022-11-05 ENCOUNTER — Ambulatory Visit (INDEPENDENT_AMBULATORY_CARE_PROVIDER_SITE_OTHER): Payer: 59

## 2022-11-05 VITALS — BP 119/61 | HR 80 | Temp 97.6°F | Ht 64.0 in | Wt 209.6 lb

## 2022-11-05 DIAGNOSIS — D509 Iron deficiency anemia, unspecified: Secondary | ICD-10-CM

## 2022-11-05 DIAGNOSIS — E782 Mixed hyperlipidemia: Secondary | ICD-10-CM

## 2022-11-05 DIAGNOSIS — Z1211 Encounter for screening for malignant neoplasm of colon: Secondary | ICD-10-CM

## 2022-11-05 DIAGNOSIS — Z124 Encounter for screening for malignant neoplasm of cervix: Secondary | ICD-10-CM | POA: Insufficient documentation

## 2022-11-05 DIAGNOSIS — Z0001 Encounter for general adult medical examination with abnormal findings: Secondary | ICD-10-CM

## 2022-11-05 DIAGNOSIS — E038 Other specified hypothyroidism: Secondary | ICD-10-CM | POA: Diagnosis not present

## 2022-11-05 DIAGNOSIS — Z87891 Personal history of nicotine dependence: Secondary | ICD-10-CM | POA: Diagnosis not present

## 2022-11-05 DIAGNOSIS — M810 Age-related osteoporosis without current pathological fracture: Secondary | ICD-10-CM

## 2022-11-05 DIAGNOSIS — Z0000 Encounter for general adult medical examination without abnormal findings: Secondary | ICD-10-CM | POA: Diagnosis not present

## 2022-11-05 DIAGNOSIS — E559 Vitamin D deficiency, unspecified: Secondary | ICD-10-CM | POA: Diagnosis not present

## 2022-11-05 DIAGNOSIS — G4733 Obstructive sleep apnea (adult) (pediatric): Secondary | ICD-10-CM | POA: Diagnosis not present

## 2022-11-05 MED ORDER — ROPINIROLE HCL 1 MG PO TABS: 1.0000 mg | ORAL_TABLET | Freq: Every day | ORAL | 5 refills | Status: DC

## 2022-11-05 NOTE — Addendum Note (Signed)
Addended by: Christia Reading on: 11/05/2022 04:56 PM   Modules accepted: Orders

## 2022-11-05 NOTE — Progress Notes (Addendum)
Subjective:  Patient ID: Makayla Gibson, female    DOB: July 24, 1961  Age: 62 y.o. MRN: [id number]  CC: Annual Exam   HPI MAUREEN KATZEN presents for Annual exam with GYN exam.      05/01/2022    3:53 PM 11/01/2021    4:24 PM 05/10/2021    4:18 PM  Depression screen PHQ 2/9  Decreased Interest 0 0 0  Down, Depressed, Hopeless 0 0 1  PHQ - 2 Score 0 0 1  Altered sleeping   0  Tired, decreased energy   1  Change in appetite   0  Feeling bad or failure about yourself    0  Trouble concentrating   0  Moving slowly or fidgety/restless   0  PHQ-9 Score   2  Difficult doing work/chores   Not difficult at all    History Jorgia has a past medical history of Abscess of breast [id number]), Anemia, Arthritis, Heart murmur, Hyperthyroidism, Osteoporosis, Sleep apnea, and Thyroid disease (11/2014).   She has a past surgical history that includes Cesarean section; Appendectomy; Incision and drainage abscess (Right, 07/29/2015); Breast cyst excision (Right, 09/06/2016); Mastectomy, partial (Right, 09/06/2016); Breast lumpectomy (Left, 01/29/2018); Debridement and closure wound (Left, 01/29/2018); and Breast reduction surgery (Left, 01/29/2018).   Her family history includes Alcohol abuse in her father; Cancer in her brother; Heart disease in her brother and mother; Osteoporosis in her brother and mother; Rheumatic fever in her mother.She reports that she quit smoking about 14 years ago. Her smoking use included cigarettes. She has never used smokeless tobacco. She reports that she does not currently use alcohol. She reports that she does not use drugs.    ROS Review of Systems  Constitutional:  Negative for appetite change, chills, diaphoresis, fatigue, fever and unexpected weight change.  HENT:  Negative for congestion, ear pain, hearing loss, postnasal drip, rhinorrhea, sneezing, sore throat and trouble swallowing.   Eyes:  Negative for pain.  Respiratory:  Negative for cough, chest tightness  and shortness of breath.   Cardiovascular:  Negative for chest pain and palpitations.  Gastrointestinal:  Negative for abdominal pain, constipation, diarrhea, nausea and vomiting.  Endocrine: Negative for cold intolerance, heat intolerance, polydipsia, polyphagia and polyuria.  Genitourinary:  Negative for dysuria, frequency and menstrual problem.  Musculoskeletal:  Negative for arthralgias and joint swelling.  Skin:  Negative for rash.  Allergic/Immunologic: Negative for environmental allergies.  Neurological:  Negative for dizziness, weakness, numbness and headaches.  Psychiatric/Behavioral:  Negative for agitation and dysphoric mood.     Objective:  BP 119/61   Pulse 80   Temp 97.6 F (36.4 C)   Ht 5' 4"$  (1.626 m)   Wt 209 lb 9.6 oz (95.1 kg)   SpO2 94%   BMI 35.98 kg/m   BP Readings from Last 3 Encounters:  11/05/22 119/61  05/01/22 124/70  11/01/21 129/74    Wt Readings from Last 3 Encounters:  11/05/22 209 lb 9.6 oz (95.1 kg)  05/01/22 206 lb 6.4 oz (93.6 kg)  11/01/21 201 lb (91.2 kg)     Physical Exam Vitals reviewed. Exam conducted with a chaperone present.  Constitutional:      General: She is not in acute distress.    Appearance: She is well-developed. She is not diaphoretic.  HENT:     Head: Normocephalic and atraumatic.     Right Ear: External ear normal.     Left Ear: External ear normal.     Nose: Nose normal.  Mouth/Throat:     Pharynx: No oropharyngeal exudate.  Eyes:     General: No scleral icterus.       Right eye: No discharge.        Left eye: No discharge.     Conjunctiva/sclera: Conjunctivae normal.     Pupils: Pupils are equal, round, and reactive to light.  Neck:     Thyroid: No thyromegaly.     Vascular: No JVD.  Cardiovascular:     Rate and Rhythm: Normal rate and regular rhythm.     Heart sounds: Normal heart sounds. No murmur heard.    No friction rub. No gallop.  Pulmonary:     Effort: Pulmonary effort is normal. No  respiratory distress.     Breath sounds: Normal breath sounds. No stridor. No wheezing or rales.  Chest:     Chest wall: No tenderness.  Abdominal:     General: Bowel sounds are normal.     Palpations: Abdomen is soft.     Tenderness: There is no abdominal tenderness.  Genitourinary:    Exam position: Lithotomy position.     Pubic Area: No rash.      Tanner stage (genital): 5.     Labia:        Right: No lesion.        Left: No lesion.      Vagina: Normal.     Cervix: Normal.     Comments: Pap obtained Musculoskeletal:        General: Normal range of motion.     Cervical back: Normal range of motion and neck supple.  Lymphadenopathy:     Cervical: No cervical adenopathy.  Skin:    General: Skin is warm and dry.  Neurological:     Mental Status: She is alert and oriented to person, place, and time.     Cranial Nerves: No cranial nerve deficit.     Motor: No abnormal muscle tone.     Coordination: Coordination normal.     Deep Tendon Reflexes: Reflexes normal.  Psychiatric:        Behavior: Behavior normal.       Assessment & Plan:   Leshea was seen today for annual exam.  Diagnoses and all orders for this visit:  Mixed hyperlipidemia -     CMP14+EGFR -     Lipid panel  Iron deficiency anemia, unspecified iron deficiency anemia type -     CBC with Differential/Platelet -     CMP14+EGFR  Other specified hypothyroidism -     Cancel: DG Bone Density; Future -     CMP14+EGFR -     Urinalysis -     TSH + free T4 -     DG [facility] DEXA; Future  Vitamin D deficiency -     CMP14+EGFR -     Urinalysis -     VITAMIN D 25 Hydroxy (Vit-D Deficiency, Fractures)  Age-related osteoporosis without current pathological fracture -     Cancel: DG Bone Density; Future -     DG [facility] DEXA; Future  Sleep apnea, obstructive  Well adult exam -     CMP14+EGFR -     Urinalysis  Encounter for screening for malignant neoplasm of lung in former smoker who quit in past 15 years  with 30 pack year history or greater -     CT CHEST LUNG CANCER SCREENING LOW DOSE WO CONTRAST; Future  Screen for colon cancer -     Cologuard  Cervical cancer  screening -     Cytology - PAP([facility])  Other orders -     rOPINIRole (REQUIP) 1 MG tablet; Take 1 tablet (1 mg total) by mouth at bedtime. For leg cramps       I am having [name]. Bonn start on rOPINIRole. I am also having her maintain her multivitamin with minerals, Naphazoline-Glycerin, acetaminophen, polyethylene glycol, alendronate, atorvastatin, furosemide, levothyroxine, potassium chloride, and meloxicam.  Allergies as of 11/05/2022       Reactions   Keflex [cephalexin] Itching, Rash   Percocet [oxycodone-acetaminophen] Itching, Rash   Pt can take oxycodone and tylenol separately    Percodan [oxycodone-aspirin] Itching, Rash   Pt can take oxycodone and aspirin separately         Medication List        Accurate as of November 05, 2022 11:59 PM. If you have any questions, ask your nurse or doctor.          acetaminophen 500 MG tablet Commonly known as: TYLENOL Take 500-1,000 mg by mouth daily as needed for headache.   alendronate 70 MG tablet Commonly known as: FOSAMAX Take with a full glass of water on an empty stomach.   atorvastatin 40 MG tablet Commonly known as: LIPITOR Take 1 tablet (40 mg total) by mouth daily. Take for cholesterol control   furosemide 40 MG tablet Commonly known as: LASIX Take 1 tablet (40 mg total) by mouth daily.   levothyroxine 125 MCG tablet Commonly known as: SYNTHROID Take 1 tablet (125 mcg total) by mouth daily.   meloxicam 15 MG tablet Commonly known as: MOBIC TAKE 1 TABLET (15 MG TOTAL) BY MOUTH DAILY WITH SUPPER.   multivitamin with minerals Tabs tablet Take 1 tablet by mouth daily.   Naphazoline-Glycerin 0.03-0.5 % Soln Place 2 drops into both eyes 3 (three) times daily as needed (for redness/irritation.).   polyethylene glycol 17 g  packet Commonly known as: MIRALAX / GLYCOLAX Take 17 g by mouth daily as needed for mild constipation.   potassium chloride 10 MEQ CR capsule Commonly known as: MICRO-K Take 1 capsule (10 mEq total) by mouth daily. TAKE 1 CAPSULE BY MOUTH EVERY DAY   rOPINIRole 1 MG tablet Commonly known as: REQUIP Take 1 tablet (1 mg total) by mouth at bedtime. For leg cramps Started by: Claretta Fraise, MD         Follow-up: Return in about 6 months (around 05/06/2023).  Claretta Fraise, M.D.

## 2022-11-06 ENCOUNTER — Other Ambulatory Visit: Payer: Self-pay | Admitting: Family Medicine

## 2022-11-06 ENCOUNTER — Other Ambulatory Visit: Payer: Self-pay | Admitting: *Deleted

## 2022-11-06 DIAGNOSIS — Z1231 Encounter for screening mammogram for malignant neoplasm of breast: Secondary | ICD-10-CM

## 2022-11-06 LAB — URINALYSIS
Bilirubin, UA: NEGATIVE
Glucose, UA: NEGATIVE
Ketones, UA: NEGATIVE
Leukocytes,UA: NEGATIVE
Nitrite, UA: NEGATIVE
Protein,UA: NEGATIVE
Specific Gravity, UA: 1.025 (ref 1.005–1.030)
Urobilinogen, Ur: 1 mg/dL (ref 0.2–1.0)
pH, UA: 6 (ref 5.0–7.5)

## 2022-11-06 LAB — CBC WITH DIFFERENTIAL/PLATELET
Basophils Absolute: 0.1 10*3/uL (ref 0.0–0.2)
Basos: 1 %
EOS (ABSOLUTE): 0.2 10*3/uL (ref 0.0–0.4)
Eos: 2 %
Hematocrit: 43.3 % (ref 34.0–46.6)
Hemoglobin: 14.8 g/dL (ref 11.1–15.9)
Immature Grans (Abs): 0 10*3/uL (ref 0.0–0.1)
Immature Granulocytes: 0 %
Lymphocytes Absolute: 2.4 10*3/uL (ref 0.7–3.1)
Lymphs: 22 %
MCH: 32 pg (ref 26.6–33.0)
MCHC: 34.2 g/dL (ref 31.5–35.7)
MCV: 94 fL (ref 79–97)
Monocytes Absolute: 0.8 10*3/uL (ref 0.1–0.9)
Monocytes: 8 %
Neutrophils Absolute: 7.4 10*3/uL — ABNORMAL HIGH (ref 1.4–7.0)
Neutrophils: 67 %
Platelets: 238 10*3/uL (ref 150–450)
RBC: 4.62 x10E6/uL (ref 3.77–5.28)
RDW: 11.8 % (ref 11.7–15.4)
WBC: 10.9 10*3/uL — ABNORMAL HIGH (ref 3.4–10.8)

## 2022-11-06 LAB — TSH+FREE T4
Free T4: 1.56 ng/dL (ref 0.82–1.77)
TSH: 1.4 u[IU]/mL (ref 0.450–4.500)

## 2022-11-06 LAB — CMP14+EGFR
ALT: 26 IU/L (ref 0–32)
AST: 22 IU/L (ref 0–40)
Albumin/Globulin Ratio: 2.1 (ref 1.2–2.2)
Albumin: 4.6 g/dL (ref 3.8–4.9)
Alkaline Phosphatase: 71 IU/L (ref 44–121)
BUN/Creatinine Ratio: 38 — ABNORMAL HIGH (ref 12–28)
BUN: 21 mg/dL (ref 8–27)
Bilirubin Total: 0.3 mg/dL (ref 0.0–1.2)
CO2: 23 mmol/L (ref 20–29)
Calcium: 10.2 mg/dL (ref 8.7–10.3)
Chloride: 106 mmol/L (ref 96–106)
Creatinine, Ser: 0.55 mg/dL — ABNORMAL LOW (ref 0.57–1.00)
Globulin, Total: 2.2 g/dL (ref 1.5–4.5)
Glucose: 110 mg/dL — ABNORMAL HIGH (ref 70–99)
Potassium: 4.2 mmol/L (ref 3.5–5.2)
Sodium: 143 mmol/L (ref 134–144)
Total Protein: 6.8 g/dL (ref 6.0–8.5)
eGFR: 105 mL/min/{1.73_m2} (ref >59)

## 2022-11-06 LAB — LIPID PANEL
Chol/HDL Ratio: 3.3 ratio (ref 0.0–4.4)
Cholesterol, Total: 173 mg/dL (ref 100–199)
HDL: 53 mg/dL (ref >39)
LDL Chol Calc (NIH): 89 mg/dL (ref 0–99)
Triglycerides: 185 mg/dL — ABNORMAL HIGH (ref 0–149)
VLDL Cholesterol Cal: 31 mg/dL (ref 5–40)

## 2022-11-06 LAB — VITAMIN D 25 HYDROXY (VIT D DEFICIENCY, FRACTURES): Vit D, 25-Hydroxy: 26.5 ng/mL — ABNORMAL LOW (ref 30.0–100.0)

## 2022-11-06 MED ORDER — VITAMIN D (ERGOCALCIFEROL) 1.25 MG (50000 UNIT) PO CAPS: 50000.0000 [IU] | ORAL_CAPSULE | ORAL | 3 refills | Status: DC

## 2022-11-06 NOTE — Progress Notes (Signed)
Dear Makayla Gibson, Your Vitamin D is  low. You need a prescription strength supplement I will send that in for you. Nurse, if at all possible, could you send in a prescription for the patient for vitamin D 50,000 units, 1 p.o. weekly #13 with 3 refills? Many thanks, WS

## 2022-11-07 DIAGNOSIS — Z780 Asymptomatic menopausal state: Secondary | ICD-10-CM | POA: Diagnosis not present

## 2022-11-07 DIAGNOSIS — M8589 Other specified disorders of bone density and structure, multiple sites: Secondary | ICD-10-CM | POA: Diagnosis not present

## 2022-11-08 ENCOUNTER — Encounter: Payer: Self-pay | Admitting: Family Medicine

## 2022-11-08 LAB — CYTOLOGY - PAP: Diagnosis: NEGATIVE

## 2022-11-13 NOTE — Progress Notes (Signed)
Your recent Pap smear performed in the office came back normal.

## 2022-11-21 ENCOUNTER — Ambulatory Visit
Admission: RE | Admit: 2022-11-21 | Discharge: 2022-11-21 | Disposition: A | Discharge status: Home / Self Care | Payer: 59 | Source: Ambulatory Visit

## 2022-11-21 DIAGNOSIS — Z1231 Encounter for screening mammogram for malignant neoplasm of breast: Secondary | ICD-10-CM

## 2022-11-23 ENCOUNTER — Other Ambulatory Visit: Payer: Self-pay

## 2022-11-23 DIAGNOSIS — N6320 Unspecified lump in the left breast, unspecified quadrant: Secondary | ICD-10-CM

## 2023-01-23 LAB — HM MAMMOGRAPHY

## 2023-01-29 ENCOUNTER — Encounter: Payer: Self-pay | Admitting: Family Medicine

## 2023-03-03 ENCOUNTER — Other Ambulatory Visit: Payer: Self-pay | Admitting: Family Medicine

## 2023-03-14 ENCOUNTER — Ambulatory Visit: Payer: Managed Care, Other (non HMO) | Admitting: Nurse Practitioner

## 2023-03-14 ENCOUNTER — Encounter: Payer: Self-pay | Admitting: Family Medicine

## 2023-03-14 ENCOUNTER — Encounter: Payer: Self-pay | Admitting: Nurse Practitioner

## 2023-03-14 VITALS — BP 120/67 | HR 54 | Temp 97.8°F | Resp 20 | Ht 64.0 in | Wt 213.0 lb

## 2023-03-14 DIAGNOSIS — L03012 Cellulitis of left finger: Secondary | ICD-10-CM

## 2023-03-14 MED ORDER — CEFTRIAXONE SODIUM 1 G IJ SOLR
1.0000 g | Freq: Once | INTRAMUSCULAR | Status: AC
Start: 2023-03-14 — End: 2023-03-14
  Administered 2023-03-14: 1 g via INTRAMUSCULAR

## 2023-03-14 NOTE — Progress Notes (Signed)
   Subjective:    Patient ID: Makayla Gibson, female    DOB: 01-Jun-1961, 62 y.o.   MRN: [id number]   Chief Complaint: Cellulitis left thumb (Seen at Urgent Care in [place] this past weekend and started doxy)   Patient was seen in urgent care with thumb infection. Was started on doxycycline. This is the second time she has had this infection. She says it is nit gettingany better and she is concerned.    Patient Active Problem List   Diagnosis Date Noted   Postoperative breast asymmetry 12/17/2017   Age-related osteoporosis without current pathological fracture 03/20/2017   Breast mass in female 06/26/2016   Hyperlipemia 03/14/2016   Heart murmur 07/22/2015   Obesity (BMI 30.0-34.9) 07/22/2015   Sleep apnea, obstructive 07/22/2015   Symptomatic mammary hypertrophy 05/17/2015   Other specified hypothyroidism 01/27/2015   Iron deficiency anemia 01/27/2015   Chronic hypercapnic respiratory failure (HCC) 01/10/2015       Review of Systems  Constitutional:  Negative for diaphoresis.  Eyes:  Negative for pain.  Respiratory:  Negative for shortness of breath.   Cardiovascular:  Negative for chest pain, palpitations and leg swelling.  Gastrointestinal:  Negative for abdominal pain.  Endocrine: Negative for polydipsia.  Skin:  Negative for rash.  Neurological:  Negative for dizziness, weakness and headaches.  Hematological:  Does not bruise/bleed easily.  All other systems reviewed and are negative.      Objective:   Physical Exam Constitutional:      Appearance: Normal appearance.  Cardiovascular:     Rate and Rhythm: Normal rate and regular rhythm.  Pulmonary:     Breath sounds: Normal breath sounds.  Skin:    Comments: Edemaous and erythematous left thumb with scant purlent drainage  Neurological:     General: No focal deficit present.     Mental Status: She is alert and oriented to person, place, and time.  Psychiatric:        Mood and Affect: Mood normal.         Behavior: Behavior normal.    BP 120/67   Pulse (!) 54   Temp 97.8 F (36.6 C) (Temporal)   Resp 20   Ht 5\' 4"  (1.626 m)   Wt 213 lb (96.6 kg)   SpO2 96%   BMI 36.56 kg/m         Assessment & Plan:   Makayla Gibson in today with chief complaint of Cellulitis left thumb (Seen at Urgent Care in [place] this past weekend and started doxy)   1. Cellulitis of left thumb Soak in epsom salt BID Finish doxycycline as prescribed  Patient had rocephin shot 3 weeks ago in urgent care- she says only the pills affect her.  Meds ordered this encounter  Medications   cefTRIAXone (ROCEPHIN) injection 1 g    Order Specific Question:   Antibiotic Indication:    Answer:   Cellulitis    The above assessment and management plan was discussed with the patient. The patient verbalized understanding of and has agreed to the management plan. Patient is aware to call the clinic if symptoms persist or worsen. Patient is aware when to return to the clinic for a follow-up visit. Patient educated on when it is appropriate to go to the emergency department.   Mary-Margaret Daphine Deutscher, FNP

## 2023-03-14 NOTE — Patient Instructions (Signed)

## 2023-03-18 ENCOUNTER — Telehealth: Payer: Self-pay | Admitting: Family Medicine

## 2023-03-19 NOTE — Telephone Encounter (Signed)
Appointment scheduled for 6/19 2:25 pm, called patient to advise, no answer, left detailed message

## 2023-03-20 ENCOUNTER — Encounter: Payer: Self-pay | Admitting: Family Medicine

## 2023-03-20 ENCOUNTER — Ambulatory Visit: Payer: Managed Care, Other (non HMO) | Admitting: Family Medicine

## 2023-03-20 ENCOUNTER — Telehealth: Payer: Self-pay | Admitting: Family Medicine

## 2023-03-20 VITALS — BP 125/68 | HR 61 | Temp 97.8°F | Ht 64.0 in | Wt 211.8 lb

## 2023-03-20 DIAGNOSIS — L02512 Cutaneous abscess of left hand: Secondary | ICD-10-CM | POA: Diagnosis not present

## 2023-03-20 MED ORDER — LINEZOLID 600 MG PO TABS
600.0000 mg | ORAL_TABLET | Freq: Two times a day (BID) | ORAL | 0 refills | Status: DC
Start: 2023-03-20 — End: 2023-03-29

## 2023-03-20 NOTE — Progress Notes (Signed)
Subjective:  Patient ID: Makayla Gibson, female    DOB: 16-Jul-1961  Age: 62 y.o. MRN: [id number]  CC: Cellulitis (Left thumb)     HPI Makayla Gibson presents for Onset May 14  with a little bump on the thumb. Went to UC on 5/24 and given amoxil and sulfa. Couldn't tolerate the sulfa. No improvement on Amoxil. Went back to UC on 6/9 again. Found out it was MRSA 2 days ago. Switched to doxycycline. Still swollen. Hurts to grip with it. Works in a nursing home.     03/20/2023    2:47 PM 03/14/2023    3:34 PM 05/01/2022    3:53 PM  Depression screen PHQ 2/9  Decreased Interest 0 0 0  Down, Depressed, Hopeless 0 0 0  PHQ - 2 Score 0 0 0  Altered sleeping  0   Tired, decreased energy  0   Change in appetite  0   Feeling bad or failure about yourself   0   Trouble concentrating  0   Moving slowly or fidgety/restless  0   Suicidal thoughts  0   PHQ-9 Score  0   Difficult doing work/chores  Not difficult at all     History Makayla Gibson has a past medical history of Abscess of breast ([id number]), Anemia, Arthritis, Heart murmur, Hyperthyroidism, Osteoporosis, Sleep apnea, and Thyroid disease (11/2014).   She has a past surgical history that includes Cesarean section; Appendectomy; Incision and drainage abscess (Right, 07/29/2015); Breast cyst excision (Right, 09/06/2016); Mastectomy, partial (Right, 09/06/2016); Breast lumpectomy (Left, 01/29/2018); Debridement and closure wound (Left, 01/29/2018); and Breast reduction surgery (Left, 01/29/2018).   Her family history includes Alcohol abuse in her father; Cancer in her brother; Heart disease in her brother and mother; Osteoporosis in her brother and mother; Rheumatic fever in her mother.She reports that she quit smoking about 14 years ago. Her smoking use included cigarettes. She has never used smokeless tobacco. She reports that she does not currently use alcohol. She reports that she does not use drugs.    ROS Review of Systems   Constitutional: Negative.   HENT: Negative.    Eyes:  Negative for visual disturbance.  Respiratory:  Negative for shortness of breath.   Cardiovascular:  Negative for chest pain.  Gastrointestinal:  Negative for abdominal pain.  Musculoskeletal:  Negative for arthralgias.    Objective:  BP 125/68   Pulse 61   Temp 97.8 F (36.6 C)   Ht 5\' 4"  (1.626 m)   Wt 211 lb 12.8 oz (96.1 kg)   SpO2 95%   BMI 36.36 kg/m   BP Readings from Last 3 Encounters:  03/20/23 125/68  03/14/23 120/67  11/05/22 119/61    Wt Readings from Last 3 Encounters:  03/20/23 211 lb 12.8 oz (96.1 kg)  03/14/23 213 lb (96.6 kg)  11/05/22 209 lb 9.6 oz (95.1 kg)     Physical Exam Constitutional:      General: She is not in acute distress.    Appearance: She is well-developed.  Cardiovascular:     Rate and Rhythm: Normal rate and regular rhythm.  Pulmonary:     Breath sounds: Normal breath sounds.  Musculoskeletal:        General: Normal range of motion.  Skin:    General: Skin is warm and dry.     Findings: Lesion (From left 1st MCP to the IP the thumb is swollen 2-3times ormal size with erythema and hematoma formation. ROM decreased for fexion, opposition)  present.  Neurological:     Mental Status: She is alert and oriented to person, place, and time.       Assessment & Plan:   Anitha was seen today for cellulitis.  Diagnoses and all orders for this visit:  Abscess of left thumb -     Ambulatory referral to Hand Surgery  Other orders -     linezolid (ZYVOX) 600 MG tablet; Take 1 tablet (600 mg total) by mouth 2 (two) times daily.   Urgent referral made.    I am having Makayla Gibson. Satz start on linezolid. I am also having her maintain her multivitamin with minerals, Naphazoline-Glycerin, acetaminophen, polyethylene glycol, alendronate, atorvastatin, furosemide, levothyroxine, potassium chloride, rOPINIRole, Vitamin D (Ergocalciferol), and meloxicam.  Allergies as of  03/20/2023       Reactions   Keflex [cephalexin] Itching, Rash   Percocet [oxycodone-acetaminophen] Itching, Rash   Pt can take oxycodone and tylenol separately    Percodan [oxycodone-aspirin] Itching, Rash   Pt can take oxycodone and aspirin separately         Medication List        Accurate as of March 20, 2023 10:20 PM. If you have any questions, ask your nurse or doctor.          acetaminophen 500 MG tablet Commonly known as: TYLENOL Take 500-1,000 mg by mouth daily as needed for headache.   alendronate 70 MG tablet Commonly known as: FOSAMAX Take with a full glass of water on an empty stomach.   atorvastatin 40 MG tablet Commonly known as: LIPITOR Take 1 tablet (40 mg total) by mouth daily. Take for cholesterol control   furosemide 40 MG tablet Commonly known as: LASIX Take 1 tablet (40 mg total) by mouth daily.   levothyroxine 125 MCG tablet Commonly known as: SYNTHROID Take 1 tablet (125 mcg total) by mouth daily.   linezolid 600 MG tablet Commonly known as: Zyvox Take 1 tablet (600 mg total) by mouth 2 (two) times daily. Started by: Mechele Claude, MD   meloxicam 15 MG tablet Commonly known as: MOBIC TAKE 1 TABLET (15 MG TOTAL) BY MOUTH DAILY WITH SUPPER   multivitamin with minerals Tabs tablet Take 1 tablet by mouth daily.   Naphazoline-Glycerin 0.03-0.5 % Soln Place 2 drops into both eyes 3 (three) times daily as needed (for redness/irritation.).   polyethylene glycol 17 g packet Commonly known as: MIRALAX / GLYCOLAX Take 17 g by mouth daily as needed for mild constipation.   potassium chloride 10 MEQ CR capsule Commonly known as: MICRO-K Take 1 capsule (10 mEq total) by mouth daily. TAKE 1 CAPSULE BY MOUTH EVERY DAY   rOPINIRole 1 MG tablet Commonly known as: REQUIP Take 1 tablet (1 mg total) by mouth at bedtime. For leg cramps   Vitamin D (Ergocalciferol) 1.25 MG (50000 UNIT) Caps capsule Commonly known as: DRISDOL Take 1 capsule (50,000  Units total) by mouth every 7 (seven) days.         Follow-up: Return in about 2 weeks (around 04/03/2023), or if symptoms worsen or fail to improve.  Mechele Claude, M.D.

## 2023-03-20 NOTE — Telephone Encounter (Signed)
Outcome Approved today CaseId:[id number];Status:Approved;Review Type:Prior Auth;Coverage Start Date:02/18/2023;Coverage End Date:04/19/2023; Authorization Expiration Date: 04/18/2023 Drug Linezolid 600MG  tablets ePA cloud logo Form [employer] Electronic PA Form [id number] NCPDP) Original Claim Info 75 CALL HELP DESK

## 2023-03-29 ENCOUNTER — Ambulatory Visit (HOSPITAL_BASED_OUTPATIENT_CLINIC_OR_DEPARTMENT_OTHER): Payer: Managed Care, Other (non HMO) | Admitting: Anesthesiology

## 2023-03-29 ENCOUNTER — Encounter (HOSPITAL_COMMUNITY): Payer: Self-pay | Admitting: Orthopedic Surgery

## 2023-03-29 ENCOUNTER — Ambulatory Visit (HOSPITAL_COMMUNITY)
Admission: RE | Admit: 2023-03-29 | Discharge: 2023-03-29 | Disposition: A | Discharge status: Home / Self Care | Payer: Managed Care, Other (non HMO) | Source: Ambulatory Visit | Attending: Orthopedic Surgery | Admitting: Orthopedic Surgery

## 2023-03-29 ENCOUNTER — Encounter (HOSPITAL_COMMUNITY)
Admission: RE | Disposition: A | Discharge status: Home / Self Care | Payer: Self-pay | Source: Ambulatory Visit | Attending: Orthopedic Surgery

## 2023-03-29 ENCOUNTER — Other Ambulatory Visit: Payer: Self-pay

## 2023-03-29 ENCOUNTER — Ambulatory Visit (HOSPITAL_COMMUNITY): Payer: Managed Care, Other (non HMO) | Admitting: Anesthesiology

## 2023-03-29 DIAGNOSIS — Z09 Encounter for follow-up examination after completed treatment for conditions other than malignant neoplasm: Secondary | ICD-10-CM | POA: Diagnosis not present

## 2023-03-29 DIAGNOSIS — E039 Hypothyroidism, unspecified: Secondary | ICD-10-CM | POA: Diagnosis not present

## 2023-03-29 DIAGNOSIS — I10 Essential (primary) hypertension: Secondary | ICD-10-CM | POA: Insufficient documentation

## 2023-03-29 DIAGNOSIS — M1990 Unspecified osteoarthritis, unspecified site: Secondary | ICD-10-CM | POA: Diagnosis not present

## 2023-03-29 DIAGNOSIS — M869 Osteomyelitis, unspecified: Secondary | ICD-10-CM

## 2023-03-29 DIAGNOSIS — G4730 Sleep apnea, unspecified: Secondary | ICD-10-CM | POA: Diagnosis not present

## 2023-03-29 DIAGNOSIS — Z87891 Personal history of nicotine dependence: Secondary | ICD-10-CM | POA: Insufficient documentation

## 2023-03-29 HISTORY — DX: Hypothyroidism, unspecified: E03.9

## 2023-03-29 HISTORY — PX: I & D EXTREMITY: SHX5045

## 2023-03-29 LAB — CBC
HCT: 39.8 % (ref 36.0–46.0)
Hemoglobin: 13.2 g/dL (ref 12.0–15.0)
MCH: 30.9 pg (ref 26.0–34.0)
MCHC: 33.2 g/dL (ref 30.0–36.0)
MCV: 93.2 fL (ref 80.0–100.0)
Platelets: 215 10*3/uL (ref 150–400)
RBC: 4.27 MIL/uL (ref 3.87–5.11)
RDW: 12.1 % (ref 11.5–15.5)
WBC: 6.9 10*3/uL (ref 4.0–10.5)
nRBC: 0 % (ref 0.0–0.2)

## 2023-03-29 LAB — BASIC METABOLIC PANEL
Anion gap: 11 (ref 5–15)
BUN: 18 mg/dL (ref 8–23)
CO2: 22 mmol/L (ref 22–32)
Calcium: 9.3 mg/dL (ref 8.9–10.3)
Chloride: 103 mmol/L (ref 98–111)
Creatinine, Ser: 0.56 mg/dL (ref 0.44–1.00)
GFR, Estimated: >60 mL/min (ref >60)
Glucose, Bld: 91 mg/dL (ref 70–99)
Potassium: 3.7 mmol/L (ref 3.5–5.1)
Sodium: 136 mmol/L (ref 135–145)

## 2023-03-29 LAB — AEROBIC/ANAEROBIC CULTURE W GRAM STAIN (SURGICAL/DEEP WOUND)

## 2023-03-29 SURGERY — IRRIGATION AND DEBRIDEMENT EXTREMITY
Anesthesia: General | Site: Thumb | Laterality: Left

## 2023-03-29 MED ORDER — DOXYCYCLINE HYCLATE 50 MG PO CAPS: 100.0000 mg | ORAL_CAPSULE | Freq: Two times a day (BID) | ORAL | 0 refills | Status: AC

## 2023-03-29 MED ORDER — ONDANSETRON HCL 4 MG/2ML IJ SOLN
INTRAMUSCULAR | Status: DC | PRN
  Administered 2023-03-29: 4 mg via INTRAVENOUS

## 2023-03-29 MED ORDER — ONDANSETRON HCL 4 MG/2ML IJ SOLN
INTRAMUSCULAR | Status: AC
  Filled 2023-03-29: qty 2

## 2023-03-29 MED ORDER — BUPIVACAINE HCL (PF) 0.25 % IJ SOLN
INTRAMUSCULAR | Status: DC | PRN
  Administered 2023-03-29: 10 mL

## 2023-03-29 MED ORDER — MIDAZOLAM HCL 2 MG/2ML IJ SOLN
INTRAMUSCULAR | Status: DC | PRN
  Administered 2023-03-29: 2 mg via INTRAVENOUS

## 2023-03-29 MED ORDER — VANCOMYCIN HCL 500 MG IV SOLR
INTRAVENOUS | Status: AC
  Filled 2023-03-29: qty 10

## 2023-03-29 MED ORDER — GENTAMICIN SULFATE 40 MG/ML IJ SOLN
INTRAMUSCULAR | Status: AC
  Filled 2023-03-29: qty 2

## 2023-03-29 MED ORDER — DOXYCYCLINE HYCLATE 50 MG PO CAPS: 100.0000 mg | ORAL_CAPSULE | Freq: Two times a day (BID) | ORAL | 0 refills | Status: DC

## 2023-03-29 MED ORDER — TRAMADOL HCL 50 MG PO TABS: 100.0000 mg | ORAL_TABLET | Freq: Four times a day (QID) | ORAL | 0 refills | Status: AC | PRN

## 2023-03-29 MED ORDER — 0.9 % SODIUM CHLORIDE (POUR BTL) OPTIME
TOPICAL | Status: DC | PRN
  Administered 2023-03-29: 1000 mL

## 2023-03-29 MED ORDER — VANCOMYCIN HCL 500 MG IV SOLR
INTRAVENOUS | Status: DC | PRN
  Administered 2023-03-29: 500 mg

## 2023-03-29 MED ORDER — LIDOCAINE 2% (20 MG/ML) 5 ML SYRINGE
INTRAMUSCULAR | Status: AC
  Filled 2023-03-29: qty 5

## 2023-03-29 MED ORDER — LIDOCAINE 2% (20 MG/ML) 5 ML SYRINGE
INTRAMUSCULAR | Status: DC | PRN
  Administered 2023-03-29: 60 mg via INTRAVENOUS
  Administered 2023-03-29: 100 mg via INTRAVENOUS

## 2023-03-29 MED ORDER — SODIUM CHLORIDE 0.9 % IR SOLN
Status: DC | PRN
  Administered 2023-03-29: 3000 mL

## 2023-03-29 MED ORDER — PHENYLEPHRINE 80 MCG/ML (10ML) SYRINGE FOR IV PUSH (FOR BLOOD PRESSURE SUPPORT)
PREFILLED_SYRINGE | INTRAVENOUS | Status: AC
  Filled 2023-03-29: qty 10

## 2023-03-29 MED ORDER — DEXAMETHASONE SODIUM PHOSPHATE 10 MG/ML IJ SOLN
INTRAMUSCULAR | Status: AC
  Filled 2023-03-29: qty 1

## 2023-03-29 MED ORDER — CHLORHEXIDINE GLUCONATE 0.12 % MT SOLN
15.0000 mL | Freq: Once | OROMUCOSAL | Status: AC
  Administered 2023-03-29: 15 mL via OROMUCOSAL
  Filled 2023-03-29: qty 15

## 2023-03-29 MED ORDER — EPHEDRINE 5 MG/ML INJ
INTRAVENOUS | Status: AC
  Filled 2023-03-29: qty 5

## 2023-03-29 MED ORDER — EPHEDRINE SULFATE (PRESSORS) 50 MG/ML IJ SOLN
INTRAMUSCULAR | Status: DC | PRN
  Administered 2023-03-29: 10 mg via INTRAVENOUS

## 2023-03-29 MED ORDER — DEXAMETHASONE SODIUM PHOSPHATE 10 MG/ML IJ SOLN
INTRAMUSCULAR | Status: DC | PRN
  Administered 2023-03-29: 5 mg via INTRAVENOUS

## 2023-03-29 MED ORDER — LACTATED RINGERS IV SOLN: INTRAVENOUS | Status: DC

## 2023-03-29 MED ORDER — PROPOFOL 10 MG/ML IV BOLUS
INTRAVENOUS | Status: DC | PRN
  Administered 2023-03-29: 170 mg via INTRAVENOUS

## 2023-03-29 MED ORDER — BUPIVACAINE HCL (PF) 0.25 % IJ SOLN
INTRAMUSCULAR | Status: AC
  Filled 2023-03-29: qty 30

## 2023-03-29 MED ORDER — ATROPINE SULFATE 0.4 MG/ML IV SOLN
INTRAVENOUS | Status: DC | PRN
  Administered 2023-03-29 (×2): .2 mg via INTRAVENOUS

## 2023-03-29 MED ORDER — GENTAMICIN SULFATE 40 MG/ML IJ SOLN
INTRAMUSCULAR | Status: DC | PRN
  Administered 2023-03-29: 80 mg

## 2023-03-29 MED ORDER — CIPROFLOXACIN HCL 500 MG PO TABS: 500.0000 mg | ORAL_TABLET | Freq: Two times a day (BID) | ORAL | 0 refills | Status: DC

## 2023-03-29 MED ORDER — MIDAZOLAM HCL 2 MG/2ML IJ SOLN
INTRAMUSCULAR | Status: AC
  Filled 2023-03-29: qty 2

## 2023-03-29 MED ORDER — ROCURONIUM BROMIDE 10 MG/ML (PF) SYRINGE
PREFILLED_SYRINGE | INTRAVENOUS | Status: AC
  Filled 2023-03-29: qty 10

## 2023-03-29 MED ORDER — VANCOMYCIN HCL IN DEXTROSE 1-5 GM/200ML-% IV SOLN
INTRAVENOUS | Status: AC
  Filled 2023-03-29: qty 200

## 2023-03-29 MED ORDER — FENTANYL CITRATE (PF) 250 MCG/5ML IJ SOLN
INTRAMUSCULAR | Status: DC | PRN
  Administered 2023-03-29 (×3): 50 ug via INTRAVENOUS
  Administered 2023-03-29: 100 ug via INTRAVENOUS

## 2023-03-29 MED ORDER — CIPROFLOXACIN HCL 500 MG PO TABS: 500.0000 mg | ORAL_TABLET | Freq: Two times a day (BID) | ORAL | 0 refills | Status: AC

## 2023-03-29 MED ORDER — FENTANYL CITRATE (PF) 250 MCG/5ML IJ SOLN
INTRAMUSCULAR | Status: AC
  Filled 2023-03-29: qty 5

## 2023-03-29 MED ORDER — VANCOMYCIN HCL 1000 MG IV SOLR
INTRAVENOUS | Status: DC | PRN
  Administered 2023-03-29: 1000 mg via INTRAVENOUS

## 2023-03-29 MED ORDER — ORAL CARE MOUTH RINSE: 15.0000 mL | Freq: Once | OROMUCOSAL | Status: AC

## 2023-03-29 SURGICAL SUPPLY — 76 items
APL PRP STRL LF DISP 70% ISPRP (MISCELLANEOUS)
BAG COUNTER SPONGE SURGICOUNT (BAG) ×1 IMPLANT
BAG SPNG CNTER NS LX DISP (BAG) ×1
BNDG CMPR 5X3 KNIT ELC UNQ LF (GAUZE/BANDAGES/DRESSINGS)
BNDG CMPR 5X4 KNIT ELC UNQ LF (GAUZE/BANDAGES/DRESSINGS) ×2
BNDG CMPR 75X21 PLY HI ABS (MISCELLANEOUS)
BNDG CMPR 9X4 STRL LF SNTH (GAUZE/BANDAGES/DRESSINGS)
BNDG ELASTIC 2X5.8 VLCR STR LF (GAUZE/BANDAGES/DRESSINGS) ×1 IMPLANT
BNDG ELASTIC 3INX 5YD STR LF (GAUZE/BANDAGES/DRESSINGS) ×1 IMPLANT
BNDG ELASTIC 4INX 5YD STR LF (GAUZE/BANDAGES/DRESSINGS) IMPLANT
BNDG ELASTIC 4X5.8 VLCR STR LF (GAUZE/BANDAGES/DRESSINGS) ×1 IMPLANT
BNDG ESMARK 4X9 LF (GAUZE/BANDAGES/DRESSINGS) IMPLANT
BNDG GAUZE DERMACEA FLUFF 4 (GAUZE/BANDAGES/DRESSINGS) ×2 IMPLANT
BNDG GZE DERMACEA 4 6PLY (GAUZE/BANDAGES/DRESSINGS) ×1
CHLORAPREP W/TINT 26 (MISCELLANEOUS) ×1 IMPLANT
CNTNR URN SCR LID CUP LEK RST (MISCELLANEOUS) IMPLANT
CONT SPEC 4OZ STRL OR WHT (MISCELLANEOUS) ×1
CORD BIPOLAR FORCEPS 12FT (ELECTRODE) ×1 IMPLANT
COVER BACK TABLE 60X90IN (DRAPES) IMPLANT
COVER MAYO STAND STRL (DRAPES) ×1 IMPLANT
COVER SURGICAL LIGHT HANDLE (MISCELLANEOUS) ×1 IMPLANT
CUFF TOURN SGL QUICK 18X4 (TOURNIQUET CUFF) ×1 IMPLANT
CUFF TOURN SGL QUICK 24 (TOURNIQUET CUFF) ×1
CUFF TRNQT CYL 24X4X16.5-23 (TOURNIQUET CUFF) IMPLANT
DRAIN PENROSE 18X1/4 LTX STRL (DRAIN) IMPLANT
DRAPE HALF SHEET 40X57 (DRAPES) ×1 IMPLANT
DRAPE OEC MINIVIEW 54X84 (DRAPES) IMPLANT
DRAPE SURG 17X23 STRL (DRAPES) ×1 IMPLANT
DRSG ADAPTIC 3X8 NADH LF (GAUZE/BANDAGES/DRESSINGS) ×1 IMPLANT
DRSG XEROFORM 1X8 (GAUZE/BANDAGES/DRESSINGS) IMPLANT
GAUZE SPONGE 4X4 12PLY STRL (GAUZE/BANDAGES/DRESSINGS) ×1 IMPLANT
GAUZE STRETCH 2X75IN STRL (MISCELLANEOUS) IMPLANT
GAUZE XEROFORM 1X8 LF (GAUZE/BANDAGES/DRESSINGS) ×1 IMPLANT
GLOVE BIO SURGEON STRL SZ7 (GLOVE) ×1 IMPLANT
GLOVE SURG UNDER POLY LF SZ7 (GLOVE) ×1 IMPLANT
GOWN STRL REUS W/ TWL XL LVL3 (GOWN DISPOSABLE) ×2 IMPLANT
GOWN STRL REUS W/TWL XL LVL3 (GOWN DISPOSABLE) ×2
IV CATH 18G X1.75 CATHLON (IV SOLUTION) IMPLANT
KIT BASIN OR (CUSTOM PROCEDURE TRAY) ×1 IMPLANT
KIT STIMULAN RAPID CURE 5CC (Orthopedic Implant) IMPLANT
KIT TURNOVER KIT B (KITS) ×1 IMPLANT
LOOP VASCLR MAXI BLUE 18IN ST (MISCELLANEOUS) IMPLANT
LOOP VASCULAR MAXI 18 BLUE (MISCELLANEOUS)
LOOPS VASCLR MAXI BLUE 18IN ST (MISCELLANEOUS) IMPLANT
MANIFOLD NEPTUNE II (INSTRUMENTS) IMPLANT
NDL HYPO 25GX1X1/2 BEV (NEEDLE) IMPLANT
NDL HYPO 25X1 1.5 SAFETY (NEEDLE) ×1 IMPLANT
NDL KEITH (NEEDLE) IMPLANT
NEEDLE HYPO 25GX1X1/2 BEV (NEEDLE) ×1 IMPLANT
NEEDLE HYPO 25X1 1.5 SAFETY (NEEDLE) ×1 IMPLANT
NEEDLE KEITH (NEEDLE) IMPLANT
NS IRRIG 1000ML POUR BTL (IV SOLUTION) ×1 IMPLANT
PACK ORTHO EXTREMITY (CUSTOM PROCEDURE TRAY) ×1 IMPLANT
PAD ABD 8X10 STRL (GAUZE/BANDAGES/DRESSINGS) ×1 IMPLANT
PAD ARMBOARD 7.5X6 YLW CONV (MISCELLANEOUS) ×1 IMPLANT
PAD CAST 4YDX4 CTTN HI CHSV (CAST SUPPLIES) ×2 IMPLANT
PADDING CAST ABS COTTON 3X4 (CAST SUPPLIES) ×1 IMPLANT
PADDING CAST COTTON 4X4 STRL (CAST SUPPLIES) ×2
SET CYSTO W/LG BORE CLAMP LF (SET/KITS/TRAYS/PACK) IMPLANT
SPLINT FIBERGLASS 4X30 (CAST SUPPLIES) IMPLANT
SPLINT PLASTER CAST XFAST 3X15 (CAST SUPPLIES) ×1 IMPLANT
SPONGE T-LAP 4X18 ~~LOC~~+RFID (SPONGE) ×1 IMPLANT
SUT ETHILON 4 0 PS 2 18 (SUTURE) IMPLANT
SUT FIBERWIRE 3-0 18 TAPR NDL (SUTURE)
SUTURE FIBERWR 3-0 18 TAPR NDL (SUTURE) IMPLANT
SWAB COLLECTION DEVICE MRSA (MISCELLANEOUS) IMPLANT
SWAB CULTURE ESWAB REG 1ML (MISCELLANEOUS) IMPLANT
SYR BULB EAR ULCER 3OZ GRN STR (SYRINGE) ×1 IMPLANT
SYR CONTROL 10ML LL (SYRINGE) IMPLANT
TOWEL GREEN STERILE (TOWEL DISPOSABLE) ×1 IMPLANT
TOWEL GREEN STERILE FF (TOWEL DISPOSABLE) ×2 IMPLANT
TUBE CONNECTING 12X1/4 (SUCTIONS) IMPLANT
UNDERPAD 30X36 HEAVY ABSORB (UNDERPADS AND DIAPERS) ×1 IMPLANT
VASCULAR TIE MAXI BLUE 18IN ST (MISCELLANEOUS)
WATER STERILE IRR 1000ML POUR (IV SOLUTION) ×1 IMPLANT
YANKAUER SUCT BULB TIP NO VENT (SUCTIONS) IMPLANT

## 2023-03-29 NOTE — Discharge Instructions (Signed)
  Shauntia Levengood, M.D. Hand Surgery  POST-OPERATIVE DISCHARGE INSTRUCTIONS   PRESCRIPTIONS: You may have been given a prescription to be taken as directed for post-operative pain control.  You may also take over the counter ibuprofen/aleve and tylenol for pain. Take this as directed on the packaging. Do not exceed 3000 mg tylenol/acetaminophen in 24 hours.  Ibuprofen 600-800 mg (3-4) tablets by mouth every 6 hours as needed for pain.  OR Aleve 2 tablets by mouth every 12 hours (twice daily) as needed for pain.  AND/OR Tylenol 1000 mg (2 tablets) every 8 hours as needed for pain.  Please use your pain medication carefully, as refills are limited and you may not be provided with one.  As stated above, please use over the counter pain medicine - it will also be helpful with decreasing your swelling.    ANESTHESIA: After your surgery, post-surgical discomfort or pain is likely. This discomfort can last several days to a few weeks. At certain times of the day your discomfort may be more intense.   Did you receive a nerve block?  A nerve block can provide pain relief for one hour to two days after your surgery. As long as the nerve block is working, you will experience little or no sensation in the area the surgeon operated on.  As the nerve block wears off, you will begin to experience pain or discomfort. It is very important that you begin taking your prescribed pain medication before the nerve block fully wears off. Treating your pain at the first sign of the block wearing off will ensure your pain is better controlled and more tolerable when full-sensation returns. Do not wait until the pain is intolerable, as the medicine will be less effective. It is better to treat pain in advance than to try and catch up.   General Anesthesia:  If you did not receive a nerve block during your surgery, you will need to start taking your pain medication shortly after your surgery and should continue  to do so as prescribed by your surgeon.     ICE AND ELEVATION: You may use ice for the first 48-72 hours, but it is not critical.   Motion of your fingers is very important to decrease the swelling.  Elevation, as much as possible for the next 48 hours, is critical for decreasing swelling as well as for pain relief. Elevation means when you are seated or lying down, you hand should be at or above your heart. When walking, the hand needs to be at or above the level of your elbow.  If the bandage gets too tight, it may need to be loosened. Please contact our office and we will instruct you in how to do this.    SURGICAL BANDAGES:  Keep your dressing and/or splint clean and dry at all times.  Do not remove until you are seen again in the office.  If careful, you may place a plastic bag over your bandage and tape the end to shower, but be careful, do not get your bandages wet.     HAND THERAPY:  You may not need any. If you do, we will begin this at your follow up visit in the clinic.    ACTIVITY AND WORK: You are encouraged to move any fingers which are not in the bandage.  Light use of the fingers is allowed to assist the other hand with daily hygiene and eating, but strong gripping or lifting is often uncomfortable and   should be avoided.  You might miss a variable period of time from work and hopefully this issue has been discussed prior to surgery. You may not do any heavy work with your affected hand for about 2 weeks.    [facility] Second Floor, [address] [place], [place] [address] [phone]  

## 2023-03-29 NOTE — Anesthesia Postprocedure Evaluation (Signed)
Anesthesia Post Note  Patient: Makayla Gibson  Procedure(s) Performed: IRRIGATION AND DEBRIDEMENT OF THUMB, CURRETAGE OF LEFT THUMB PROXIMAL PHALANX, AND PLACEMENT OF CEMENT ANTIBIOTIC SPACER (Left: Thumb)     Patient location during evaluation: PACU Anesthesia Type: General Level of consciousness: awake and alert Pain management: pain level controlled Vital Signs Assessment: post-procedure vital signs reviewed and stable Respiratory status: spontaneous breathing, nonlabored ventilation, respiratory function stable and patient connected to nasal cannula oxygen Cardiovascular status: blood pressure returned to baseline and stable Postop Assessment: no apparent nausea or vomiting Anesthetic complications: no   No notable events documented.  Last Vitals:  Vitals:   03/29/23 2015 03/29/23 2030  BP: (!) 148/87 (!) 148/78  Pulse: (!) 59 (!) 44  Resp: 12 18  Temp:  36.6 C  SpO2: 94% 93%    Last Pain:  Vitals:   03/29/23 2015  TempSrc:   PainSc: 0-No pain                 Earl Lites P Esequiel Kleinfelter

## 2023-03-29 NOTE — Interval H&P Note (Signed)
History and Physical Interval Note:  03/29/2023 5:33 PM  Makayla Gibson  has presented today for surgery, with the diagnosis of CELLULITIS OF THUMB.  The various methods of treatment have been discussed with the patient and family. After consideration of risks, benefits and other options for treatment, the patient has consented to  Procedure(s): IRRIGATION AND DEBRIDEMENT OF THUMB (Left) as a surgical intervention.  The patient's history has been reviewed, patient examined, no change in status, stable for surgery.  I have reviewed the patient's chart and labs.  Questions were answered to the patient's satisfaction.     [name]

## 2023-03-29 NOTE — Anesthesia Preprocedure Evaluation (Addendum)
Anesthesia Evaluation  Patient identified by MRN, date of birth, ID band Patient awake    Reviewed: Allergy & Precautions, NPO status , Patient's Chart, lab work & pertinent test results  Airway Mallampati: I  TM Distance: >3 FB Neck ROM: Full    Dental  (+) Teeth Intact, Dental Advisory Given   Pulmonary sleep apnea , former smoker   breath sounds clear to auscultation       Cardiovascular hypertension, + Valvular Problems/Murmurs  Rhythm:Regular Rate:Bradycardia     Neuro/Psych negative neurological ROS  negative psych ROS   GI/Hepatic negative GI ROS, Neg liver ROS,,,  Endo/Other  Hypothyroidism    Renal/GU negative Renal ROS     Musculoskeletal  (+) Arthritis ,    Abdominal   Peds  Hematology   Anesthesia Other Findings   Reproductive/Obstetrics                             Anesthesia Physical Anesthesia Plan  ASA: 2  Anesthesia Plan: General   Post-op Pain Management:    Induction: Intravenous  PONV Risk Score and Plan: 4 or greater and Ondansetron, Dexamethasone, Midazolam and Scopolamine patch - Pre-op  Airway Management Planned:   Additional Equipment: None  Intra-op Plan:   Post-operative Plan: Extubation in OR  Informed Consent: I have reviewed the patients History and Physical, chart, labs and discussed the procedure including the risks, benefits and alternatives for the proposed anesthesia with the patient or authorized representative who has indicated his/her understanding and acceptance.     Dental advisory given  Plan Discussed with: CRNA  Anesthesia Plan Comments:        Anesthesia Quick Evaluation

## 2023-03-29 NOTE — Transfer of Care (Signed)
Immediate Anesthesia Transfer of Care Note  Patient: Makayla Gibson  Procedure(s) Performed: IRRIGATION AND DEBRIDEMENT OF THUMB, CURRETAGE OF LEFT THUMB PROXIMAL PHALANX, AND PLACEMENT OF CEMENT ANTIBIOTIC SPACER (Left: Thumb)  Patient Location: PACU  Anesthesia Type:General  Level of Consciousness: awake, alert , and patient cooperative  Airway & Oxygen Therapy: Patient Spontanous Breathing  Post-op Assessment: Report given to RN, Post -op Vital signs reviewed and stable, and Patient moving all extremities X 4  Post vital signs: Reviewed and stable  Last Vitals:  Vitals Value Taken Time  BP 152/81 03/29/23 1946  Temp    Pulse 65 03/29/23 1950  Resp 15 03/29/23 1950  SpO2 94 % 03/29/23 1950  Vitals shown include unvalidated device data.  Last Pain:  Vitals:   03/29/23 1523  TempSrc:   PainSc: 0-No pain         Complications: No notable events documented.

## 2023-03-29 NOTE — Anesthesia Procedure Notes (Signed)
Procedure Name: LMA Insertion Date/Time: 03/29/2023 6:36 PM  Performed by: Kayleen Memos, CRNAPre-anesthesia Checklist: Patient identified, Emergency Drugs available, Suction available, Timeout performed and Patient being monitored Patient Re-evaluated:Patient Re-evaluated prior to induction Oxygen Delivery Method: Circle system utilized Preoxygenation: Pre-oxygenation with 100% oxygen Induction Type: IV induction LMA: LMA inserted LMA Size: 4.0 Number of attempts: 1

## 2023-03-29 NOTE — Op Note (Signed)
Date of Surgery: 03/29/2023  INDICATIONS: Patient is a 62 y.o.-year-old female with a severe infection involving the left thumb.  This started approximately 5 or 6 weeks ago with what sounds like a small abscess.  This drained spontaneously.  She has since been followed by an outside provider for what was presumed to be severe cellulitis.  I saw her in the office this week where she was found to have severe bony destruction of the left thumb proximal phalanx with sinus tract concerning for osteomyelitis.  She presents this evening for formal irrigation and debridement of the left thumb.  We had a very thorough discussion in the office regarding the severity of her infection.  We reviewed that the risks of the surgery would include bleeding, persistent infection, damage to neurovascular structures, delayed wound healing, need for further surgeries, and potential loss of the thumb.  And risks were again discussed with the patient in preoperative holding area.  The patient wishes to proceed with surgery.  Informed consent was signed after our discussion.   PREOPERATIVE DIAGNOSIS:  Left thumb proximal phalanx osteomyelitis  POSTOPERATIVE DIAGNOSIS: Same.  PROCEDURE:  Irrigation and debridement of left thumb with curettage of proximal phalanx (60454) Placement of Stimulan beads with vancomycin and gentamicin into defect of proximal phalanx (09811)  SURGEON: Waylan Rocher, M.D.  ASSIST: None  ANESTHESIA:  general, local  IV FLUIDS AND URINE: See anesthesia.  ESTIMATED BLOOD LOSS: <10 mL.  IMPLANTS:  Implant Name Type Inv. Item Serial No. Manufacturer Lot No. LRB No. Used Action  KIT STIMULAN RAPID CURE 5CC - [id number] Orthopedic Implant KIT STIMULAN RAPID CURE 5CC  [employer] [id number] Left 1 Implanted     DRAINS: None  COMPLICATIONS: None  DESCRIPTION OF PROCEDURE: The patient was met in the preoperative holding area where the surgical site was marked and the consent form was  signed.  The patient was then taken to the operating room and transferred to the operating table.  All bony prominences were well padded.  A tourniquet was applied to the left upper arm.  General endotracheal anesthesia was induced.  The operative extremity was prepped and draped in the usual and sterile fashion.  A formal time-out was performed to confirm that this was the correct patient, surgery, side, and site.   Following formal timeout, the limb was gently exsanguinated by gravity and the tourniquet inflated to 250 mmHg.  I began by making a longitudinal incision along the dorsal radial aspect of the thumb incorporating the sinus tract.  The incision went from the base of the proximal phalanx to the IP joint extension crease.  The skin was incised.  There was an obvious sinus tract that extended from the skin through the tissue adjacent to the extensor tendon and to the level of the bony defect.  The soft tissue in this sinus tract was sharply excised.  The extensor tendon was elevated off of the underlying proximal phalanx.  With the use of a rongeur, the bony defect in the proximal phalanx was carefully identified.  There was no gross purulence but abundant mucoid fibrinous material in the bony defect.  All nonviable, necrotic, or friable appearing tissue was debrided using a combination of curette and rongeurs.  This was done to the level of healthy appearing cortical and cancellous bone.  There was a large defect in the radial metadiaphysis of the proximal phalanx.  The defect was 1 to 1.5 cm in length.  The wound and proximal phalanx was thoroughly  irrigated with copious sterile saline via low flow cystoscopy tubing.  Given the size of the defect, I elected to fill the void with calcium sulfate beads made with 500 mg of vancomycin and 240 mg of gentamicin.  The wound was then reapproximated using a 4-0 nylon suture.  Care was taken to not close the wound too tightly so as to allow for continued  drainage.  The tourniquet was then deflated.  The thumb was pink and well-perfused with brisk capillary refill within the procedure.  The wound was then dressed with Xeroform, folded Kerlix, cast padding, and a well-padded thumb spica splint was applied.  The patient was reversed from anesthesia and extubated uneventfully.  They were transferred from the operating table to the postoperative bed.  All counts were correct x 2 at the end of the procedure.  The patient was then taken to the PACU in stable condition.   POSTOPERATIVE PLAN: She will be discharged home with a prescription for oral doxycycline and ciprofloxacin per my discussion with infectious disease.  Patient is going on a trip to [place] to visit her daughter and will be gone for roughly 1 week.  Patient is unable and unwilling to reschedule the trip.  I would like to see her back as soon as she returns from her trip.  I have made arrangements for her to be seen by infectious disease in their office to help with antibiotic management.  Waylan Rocher, MD 7:45 PM

## 2023-03-29 NOTE — H&P (Signed)
HAND SURGERY   HPI: Patient is a 62 y.o. female who presents with a severe infection involving the left thumb proximal phalanx.  This started 5 or 6 weeks ago as a small abscess that drained spontaneously.  Patient has been since been on multiple antibiotics provided by her PCP.  She has had persistent pain and inability to use the thumb since that time.  X-rays were obtained my office which showed obstruction of the proximal phalanx consistent with osteomyelitis.  She presents today for formal irrigation and debridement.  Patient denies any changes to their medical history or new systemic symptoms today.    Past Medical History:  Diagnosis Date   Abscess of breast 1202017   Anemia    Arthritis    Heart murmur    as a child   Hypothyroidism    Osteoporosis    Sleep apnea    Thyroid disease 11/2014   Past Surgical History:  Procedure Laterality Date   APPENDECTOMY     BREAST CYST EXCISION Right 09/06/2016   BREAST LUMPECTOMY Left 01/29/2018   Procedure: LEFT BREAST LUMPECTOMY;  Surgeon: Griselda Miner, MD;  Location: [facility] OR;  Service: General;  Laterality: Left;   BREAST REDUCTION SURGERY Left 01/29/2018   Procedure: MAMMARY REDUCTION  (BREAST) FOR SYMETRY;  Surgeon: Peggye Form, DO;  Location: [facility] OR;  Service: Plastics;  Laterality: Left;   CESAREAN SECTION     4   DEBRIDEMENT AND CLOSURE WOUND Left 01/29/2018   Procedure: DEBRIDEMENT AND CLOSURE WOUND;  Surgeon: Peggye Form, DO;  Location: [facility] OR;  Service: Plastics;  Laterality: Left;   INCISION AND DRAINAGE ABSCESS Right 07/29/2015   Procedure: INCISION AND DRAINAGE BREAST ABSCESS;  Surgeon: Franky Macho, MD;  Location: [facility] ORS;  Service: General;  Laterality: Right;   MASTECTOMY, PARTIAL Right 09/06/2016   Procedure: EXCISION OF LARGE RIGHT BREAST CHRONIC ABSCESS CAVITY;  Surgeon: Chevis Pretty III, MD;  Location: [facility] OR;  Service: General;  Laterality: Right;   Social History   Socioeconomic History   Marital status:  Married    Spouse name: Not on file   Number of children: Not on file   Years of education: Not on file   Highest education level: Not on file  Occupational History   Not on file  Tobacco Use   Smoking status: Former    Types: Cigarettes    Quit date: 10/28/2008    Years since quitting: 14.4   Smokeless tobacco: Never  Vaping Use   Vaping Use: Never used  Substance and Sexual Activity   Alcohol use: Not Currently    Comment: In the past   Drug use: No   Sexual activity: Never  Other Topics Concern   Not on file  Social History Narrative   Not on file   Social Determinants of Health   Financial Resource Strain: Not on file  Food Insecurity: Not on file  Transportation Needs: Not on file  Physical Activity: Not on file  Stress: Not on file  Social Connections: Not on file   Family History  Problem Relation Age of Onset   Rheumatic fever Mother    Heart disease Mother    Osteoporosis Mother    Alcohol abuse Father    Cancer Brother        liver   Osteoporosis Brother    Heart disease Brother    Breast cancer Neg Hx    - negative except otherwise stated in the family history section Allergies  Allergen Reactions   Keflex [Cephalexin] Itching and Rash   Percocet [Oxycodone-Acetaminophen] Itching and Rash    Pt can take oxycodone and tylenol separately    Percodan [Oxycodone-Aspirin] Itching and Rash    Pt can take oxycodone and aspirin separately    Prior to Admission medications   Medication Sig Start Date End Date Taking? Authorizing Provider  acetaminophen (TYLENOL) 500 MG tablet Take 500-1,000 mg by mouth daily as needed for headache.    Yes [provider]  alendronate (FOSAMAX) 70 MG tablet Take with a full glass of water on an empty stomach. 05/01/22  Yes Mechele Claude, MD  atorvastatin (LIPITOR) 40 MG tablet Take 1 tablet (40 mg total) by mouth daily. Take for cholesterol control 05/01/22  Yes Stacks, Broadus John, MD  furosemide (LASIX) 40 MG tablet Take  1 tablet (40 mg total) by mouth daily. 05/01/22  Yes Mechele Claude, MD  levothyroxine (SYNTHROID) 125 MCG tablet Take 1 tablet (125 mcg total) by mouth daily. 05/01/22  Yes Stacks, Broadus John, MD  linezolid (ZYVOX) 600 MG tablet Take 1 tablet (600 mg total) by mouth 2 (two) times daily. 03/20/23  Yes Stacks, Broadus John, MD  meloxicam (MOBIC) 15 MG tablet TAKE 1 TABLET (15 MG TOTAL) BY MOUTH DAILY WITH SUPPER 03/04/23  Yes Stacks, Broadus John, MD  Multiple Vitamin (MULTIVITAMIN WITH MINERALS) TABS tablet Take 1 tablet by mouth daily.   Yes [provider]  Naphazoline-Glycerin 0.03-0.5 % SOLN Place 2 drops into both eyes 3 (three) times daily as needed (for redness/irritation.).    Yes [provider]  potassium chloride (MICRO-K) 10 MEQ CR capsule Take 1 capsule (10 mEq total) by mouth daily. TAKE 1 CAPSULE BY MOUTH EVERY DAY 05/01/22  Yes Stacks, Broadus John, MD  rOPINIRole (REQUIP) 1 MG tablet Take 1 tablet (1 mg total) by mouth at bedtime. For leg cramps 11/05/22  Yes Stacks, Broadus John, MD  Vitamin D, Ergocalciferol, (DRISDOL) 1.25 MG (50000 UNIT) CAPS capsule Take 1 capsule (50,000 Units total) by mouth every 7 (seven) days. 11/06/22  Yes Stacks, Broadus John, MD  polyethylene glycol (MIRALAX / GLYCOLAX) packet Take 17 g by mouth daily as needed for mild constipation.    [provider]   No results found. - Positive ROS: All other systems have been reviewed and were otherwise negative with the exception of those mentioned in the HPI and as above.  Physical Exam: General: No acute distress, resting comfortably Cardiovascular: BUE warm and well perfused, normal rate Respiratory: Normal WOB on RA Skin: Warm and dry Neurologic: Sensation intact distally Psychiatric: Patient is at baseline mood and affect  Left hand: Significant swelling of the thumb from the MCP joint to the thumb tip.  There is mild erythema at the dorsal aspect of the thumb over the proximal phalanx.  There is a small subcentimeter  wound at the dorsal and radial aspect of the thumb overlying the proximal phalanx.  There is no drainage.  She has limited active flexion extension of the thumb.  She does have gross instability of the thumb through the proximal phalanx defect.  Sensations intact light touch the radial ulnar border of the thumb.  The thumb is warm well-perfused with brisk cap refill.    Assessment: 62 year old female with osteomyelitis involving the left thumb proximal phalanx.  Presents today for formal irrigation debridement.  Plan: OR today for irrigation debridement of the left thumb proximal phalanx.  Discussed with patient that we will curettage any necrotic or nonviable appearing bone and may have  to place an antibiotic spacer depending on the size of the resulting defect. We again reviewed the risks of surgery which include bleeding, infection, damage to neurovascular structures, persistent symptoms, persistent infection, need for additional surgery, loss of the thumb.  Informed consent was signed.  All questions were answered.   Marlyne Beards, M.D. [facility] 5:30 PM

## 2023-03-30 LAB — AEROBIC/ANAEROBIC CULTURE W GRAM STAIN (SURGICAL/DEEP WOUND): Gram Stain: NONE SEEN

## 2023-03-31 ENCOUNTER — Encounter (HOSPITAL_COMMUNITY): Payer: Self-pay | Admitting: Orthopedic Surgery

## 2023-03-31 LAB — AEROBIC/ANAEROBIC CULTURE W GRAM STAIN (SURGICAL/DEEP WOUND)

## 2023-04-01 LAB — ACID FAST SMEAR (AFB, MYCOBACTERIA): Acid Fast Smear: NEGATIVE

## 2023-04-01 LAB — AEROBIC/ANAEROBIC CULTURE W GRAM STAIN (SURGICAL/DEEP WOUND)

## 2023-04-03 LAB — AEROBIC/ANAEROBIC CULTURE W GRAM STAIN (SURGICAL/DEEP WOUND)

## 2023-04-10 ENCOUNTER — Other Ambulatory Visit: Payer: Self-pay | Admitting: Family Medicine

## 2023-04-12 ENCOUNTER — Encounter: Payer: Self-pay | Admitting: Orthopedic Surgery

## 2023-04-15 ENCOUNTER — Encounter: Payer: Self-pay | Admitting: Family Medicine

## 2023-04-15 ENCOUNTER — Other Ambulatory Visit: Payer: Managed Care, Other (non HMO)

## 2023-04-15 ENCOUNTER — Ambulatory Visit: Payer: Managed Care, Other (non HMO) | Admitting: Family Medicine

## 2023-04-15 VITALS — BP 107/68 | HR 72 | Temp 97.4°F | Ht 64.0 in | Wt 214.0 lb

## 2023-04-15 DIAGNOSIS — R001 Bradycardia, unspecified: Secondary | ICD-10-CM

## 2023-04-15 DIAGNOSIS — R600 Localized edema: Secondary | ICD-10-CM

## 2023-04-15 MED ORDER — FUROSEMIDE 40 MG PO TABS: 40.0000 mg | ORAL_TABLET | Freq: Every day | ORAL | 3 refills | Status: DC

## 2023-04-15 NOTE — Progress Notes (Signed)
Subjective:  Patient ID: Makayla Gibson, female    DOB: 12-24-1960  Age: 62 y.o. MRN: [id number]  CC: Referral (Cardiology referral, Heart rate dropped to 20 during thumb surgery)   HPI Makayla Gibson presents for heart rate noted to fall to 20 during surgical debridement of thumb. EKG of 6/28 showed bradycardia. Denies chest pain. Feels tired, wiped out. Nonfocal. Pt. Has dx of osteomyelitis. Has planned viti with ID in the next few days. There is also increasing swelling in the legs recently.      04/15/2023    2:21 PM 04/15/2023    2:14 PM 03/20/2023    2:47 PM  Depression screen PHQ 2/9  Decreased Interest 0 0 0  Down, Depressed, Hopeless 0 0 0  PHQ - 2 Score 0 0 0    History Makayla Gibson has a past medical history of Abscess of breast ([id number]), Anemia, Arthritis, Heart murmur, Hypothyroidism, Osteoporosis, Sleep apnea, and Thyroid disease (11/2014).   She has a past surgical history that includes Cesarean section; Appendectomy; Incision and drainage abscess (Right, 07/29/2015); Breast cyst excision (Right, 09/06/2016); Mastectomy, partial (Right, 09/06/2016); Breast lumpectomy (Left, 01/29/2018); Debridement and closure wound (Left, 01/29/2018); Breast reduction surgery (Left, 01/29/2018); and I & D extremity (Left, 03/29/2023).   Her family history includes Alcohol abuse in her father; Cancer in her brother; Heart disease in her brother and mother; Osteoporosis in her brother and mother; Rheumatic fever in her mother.She reports that she quit smoking about 14 years ago. Her smoking use included cigarettes. She has never used smokeless tobacco. She reports that she does not currently use alcohol. She reports that she does not use drugs.    ROS Review of Systems  Constitutional: Negative.   HENT: Negative.    Eyes:  Negative for visual disturbance.  Respiratory:  Negative for shortness of breath.   Cardiovascular:  Negative for chest pain.  Gastrointestinal:  Negative for  abdominal pain.  Musculoskeletal:  Negative for arthralgias.    Objective:  BP 107/68   Pulse 72   Temp (!) 97.4 F (36.3 C)   Ht 5\' 4"  (1.626 m)   Wt 214 lb (97.1 kg)   SpO2 93%   BMI 36.73 kg/m   BP Readings from Last 3 Encounters:  04/15/23 107/68  03/29/23 (!) 148/78  03/20/23 125/68    Wt Readings from Last 3 Encounters:  04/15/23 214 lb (97.1 kg)  03/29/23 213 lb 13.5 oz (97 kg)  03/20/23 211 lb 12.8 oz (96.1 kg)     Physical Exam Constitutional:      General: She is not in acute distress.    Appearance: She is well-developed.  Cardiovascular:     Rate and Rhythm: Normal rate and regular rhythm.  Pulmonary:     Breath sounds: Normal breath sounds.  Musculoskeletal:        General: Normal range of motion.  Skin:    General: Skin is warm and dry.  Neurological:     Mental Status: She is alert and oriented to person, place, and time.       Assessment & Plan:   [name] was seen today for referral.  Diagnoses and all orders for this visit:  Bradycardia -     LONG TERM MONITOR (3-14 DAYS); Future -     Ambulatory referral to Cardiology  Edema of both legs -     TSH + free T4 -     furosemide (LASIX) 40 MG tablet; Take 1 tablet (40 mg  total) by mouth daily. -     Ambulatory referral to Cardiology       I am having Makayla Gibson. Makayla Gibson maintain her multivitamin with minerals, Naphazoline-Glycerin, acetaminophen, polyethylene glycol, alendronate, atorvastatin, levothyroxine, potassium chloride, Vitamin D (Ergocalciferol), meloxicam, rOPINIRole, and furosemide.  Allergies as of 04/15/2023       Reactions   Keflex [cephalexin] Itching, Rash   Percocet [oxycodone-acetaminophen] Itching, Rash   Pt can take oxycodone and tylenol separately    Percodan [oxycodone-aspirin] Itching, Rash   Pt can take oxycodone and aspirin separately         Medication List        Accurate as of April 15, 2023 11:59 PM. If you have any questions, ask your nurse  or doctor.          acetaminophen 500 MG tablet Commonly known as: TYLENOL Take 500-1,000 mg by mouth daily as needed for headache.   alendronate 70 MG tablet Commonly known as: FOSAMAX Take with a full glass of water on an empty stomach.   atorvastatin 40 MG tablet Commonly known as: LIPITOR Take 1 tablet (40 mg total) by mouth daily. Take for cholesterol control   furosemide 40 MG tablet Commonly known as: LASIX Take 1 tablet (40 mg total) by mouth daily.   levothyroxine 125 MCG tablet Commonly known as: SYNTHROID Take 1 tablet (125 mcg total) by mouth daily.   meloxicam 15 MG tablet Commonly known as: MOBIC TAKE 1 TABLET (15 MG TOTAL) BY MOUTH DAILY WITH SUPPER   multivitamin with minerals Tabs tablet Take 1 tablet by mouth daily.   Naphazoline-Glycerin 0.03-0.5 % Soln Place 2 drops into both eyes 3 (three) times daily as needed (for redness/irritation.).   polyethylene glycol 17 g packet Commonly known as: MIRALAX / GLYCOLAX Take 17 g by mouth daily as needed for mild constipation.   potassium chloride 10 MEQ CR capsule Commonly known as: MICRO-K Take 1 capsule (10 mEq total) by mouth daily. TAKE 1 CAPSULE BY MOUTH EVERY DAY   rOPINIRole 1 MG tablet Commonly known as: REQUIP TAKE 1 TABLET (1 MG TOTAL) BY MOUTH AT BEDTIME. FOR LEG CRAMPS   Vitamin D (Ergocalciferol) 1.25 MG (50000 UNIT) Caps capsule Commonly known as: DRISDOL Take 1 capsule (50,000 Units total) by mouth every 7 (seven) days.         Follow-up: No follow-ups on file.  Makayla Gibson, M.D.

## 2023-04-16 LAB — TSH+FREE T4
Free T4: 1.64 ng/dL (ref 0.82–1.77)
TSH: 2.17 u[IU]/mL (ref 0.450–4.500)

## 2023-04-16 NOTE — Progress Notes (Signed)
Hello Jaline,  Your lab result is normal and/or stable.Some minor variations that are not significant are commonly marked abnormal, but do not represent any medical problem for you.  Best regards, Warren Stacks, M.D.

## 2023-04-29 ENCOUNTER — Ambulatory Visit: Payer: Managed Care, Other (non HMO) | Admitting: Family Medicine

## 2023-05-01 ENCOUNTER — Other Ambulatory Visit: Payer: Self-pay

## 2023-05-01 ENCOUNTER — Encounter (HOSPITAL_COMMUNITY): Payer: Self-pay | Admitting: Orthopedic Surgery

## 2023-05-01 NOTE — Anesthesia Preprocedure Evaluation (Signed)
Anesthesia Evaluation  Patient identified by MRN, date of birth, ID band Patient awake    Reviewed: Allergy & Precautions, H&P , NPO status , Patient's Chart, lab work & pertinent test results  Airway Mallampati: II   Neck ROM: full    Dental   Pulmonary sleep apnea , former smoker   breath sounds clear to auscultation       Cardiovascular negative cardio ROS  Rhythm:regular Rate:Normal     Neuro/Psych    GI/Hepatic   Endo/Other  Hypothyroidism    Renal/GU      Musculoskeletal  (+) Arthritis ,    Abdominal   Peds  Hematology   Anesthesia Other Findings   Reproductive/Obstetrics                             Anesthesia Physical Anesthesia Plan  ASA: 2  Anesthesia Plan: MAC and Regional   Post-op Pain Management:    Induction: Intravenous  PONV Risk Score and Plan: 2 and Midazolam, Treatment may vary due to age or medical condition and Propofol infusion  Airway Management Planned: Simple Face Mask  Additional Equipment:   Intra-op Plan:   Post-operative Plan:   Informed Consent: I have reviewed the patients History and Physical, chart, labs and discussed the procedure including the risks, benefits and alternatives for the proposed anesthesia with the patient or authorized representative who has indicated his/her understanding and acceptance.     Dental advisory given  Plan Discussed with: CRNA, Anesthesiologist and Surgeon  Anesthesia Plan Comments: (PAT note by Antionette Poles, PA-C: 62 year old female with pertinent history including OSA, sinus bradycardia, hypothyroid.  Recently underwent I&D of left thumb with placement of antibiotic spacer on 03/29/2023.  She had general anesthesia with LMA.  She became bradycardic shortly after induction with heart rates into the low 30s.  She was administered atropine and ephedrine with appropriate response.  Heart rate subsequently  stabilized and procedure was able to continue.  Twelve-lead EKG done at that time showed marked sinus bradycardia with a rate of 41 and right bundle branch block.  Patient followed up with her PCP Dr. Mechele Claude on 04/15/2023 and discussed episode of bradycardia during anesthesia.  He ordered a cardiac event monitor and referred the patient to cardiology.  Monitor and cardiology referral are still pending.  I discussed patient with anesthesiologists Dr. Krista Blue and Dr. Glade Stanford.  Advised patient okay to proceed as planned given that this is not an elective procedure.  She will be evaluated by assigned anesthesiologist on day of surgery.   )        Anesthesia Quick Evaluation

## 2023-05-01 NOTE — Progress Notes (Signed)
PCP - Makayla Gibson Cardiologist - New patient appointment with Dr. Antoine Poche 07-24-23  PPM/ICD - Denies Device Orders - N/A Rep Notified - N/A  EKG - 03-29-23 ECHO - 2016  DM Denies    CPAP - yes uses nightly  DM Denies  Blood Thinner Instructions: Denies Aspirin Instructions: N/A  ERAS Protcol - NPO  COVID TEST- denies  Anesthesia review: yes Spoke with anesthesia today regarding "heart monitor"   Patient verbally denies any shortness of breath, fever, cough and chest pain during phone call   -------------  SDW INSTRUCTIONS given:  Your procedure is scheduled on May 02, 2023.  Report to [facility] Main Entrance "A" at 1045 A.M., and check in at the Admitting office.  Call this number if you have problems the morning of surgery:  [phone]   Remember:  Do not eat after midnight the night before your surgery   Take these medicines the morning of surgery with A SIP OF WATER   levothyroxine (SYNTHROID)   As of today, STOP taking any Aspirin (unless otherwise instructed by your surgeon) Aleve, Naproxen, Ibuprofen, Motrin, Advil, Goody's, BC's, all herbal medications, fish oil, and all vitamins.                      Do not wear jewelry, make up, or nail polish            Do not wear lotions, powders, perfumes/colognes, or deodorant.            Do not shave 48 hours prior to surgery.  Men may shave face and neck.            Do not bring valuables to the hospital.            [facility] is not responsible for any belongings or valuables.  Do NOT Smoke (Tobacco/Vaping) 24 hours prior to your procedure If you use a CPAP at night, you may bring all equipment for your overnight stay.   Contacts, glasses, dentures or bridgework may not be worn into surgery.      For patients admitted to the hospital, discharge time will be determined by your treatment team.   Patients discharged the day of surgery will not be allowed to drive home, and someone needs to stay with them  for 24 hours.    Special instructions:   - Preparing For Surgery  Before surgery, you can play an important role. Because skin is not sterile, your skin needs to be as free of germs as possible. You can reduce the number of germs on your skin by washing with CHG (chlorahexidine gluconate) Soap before surgery.  CHG is an antiseptic cleaner which kills germs and bonds with the skin to continue killing germs even after washing.    Oral Hygiene is also important to reduce your risk of infection.  Remember - BRUSH YOUR TEETH THE MORNING OF SURGERY WITH YOUR REGULAR TOOTHPASTE  Please do not use if you have an allergy to CHG or antibacterial soaps. If your skin becomes reddened/irritated stop using the CHG.  Do not shave (including legs and underarms) for at least 48 hours prior to first CHG shower. It is OK to shave your face.  Please follow these instructions carefully.   Shower the NIGHT BEFORE SURGERY and the MORNING OF SURGERY with DIAL Soap.   Pat yourself dry with a CLEAN TOWEL.  Wear CLEAN PAJAMAS to bed the night before surgery  Place CLEAN SHEETS on  your bed the night of your first shower and DO NOT SLEEP WITH PETS.   Day of Surgery: Please shower morning of surgery  Wear Clean/Comfortable clothing the morning of surgery Do not apply any deodorants/lotions.   Remember to brush your teeth WITH YOUR REGULAR TOOTHPASTE.   Questions were answered. Patient verbalized understanding of instructions.

## 2023-05-01 NOTE — Progress Notes (Signed)
Anesthesia Chart Review: Same day workup  62 year old female with pertinent history including OSA, sinus bradycardia, hypothyroid.  Recently underwent I&D of left thumb with placement of antibiotic spacer on 03/29/2023.  She had general anesthesia with LMA.  She became bradycardic shortly after induction with heart rates into the low 30s.  She was administered atropine and ephedrine with appropriate response.  Heart rate subsequently stabilized and procedure was able to continue.  Twelve-lead EKG done at that time showed marked sinus bradycardia with a rate of 41 and right bundle branch block.  Patient followed up with her PCP Dr. Mechele Claude on 04/15/2023 and discussed episode of bradycardia during anesthesia.  He ordered a cardiac event monitor and referred the patient to cardiology.  Monitor and cardiology referral are still pending.  I discussed patient with anesthesiologists Dr. Krista Blue and Dr. Glade Stanford.  Advised patient okay to proceed as planned given that this is not an elective procedure.  She will be evaluated by assigned anesthesiologist on day of surgery.    Zannie Cove [facility] Short Stay Center/Anesthesiology Phone [phone] 05/01/2023 12:19 PM

## 2023-05-01 NOTE — Progress Notes (Signed)
Spoke to patient at this time reports she returned monitor as of 04-29-23

## 2023-05-02 ENCOUNTER — Ambulatory Visit (HOSPITAL_COMMUNITY): Payer: Managed Care, Other (non HMO) | Admitting: Physician Assistant

## 2023-05-02 ENCOUNTER — Ambulatory Visit (HOSPITAL_COMMUNITY): Payer: Managed Care, Other (non HMO)

## 2023-05-02 ENCOUNTER — Ambulatory Visit (HOSPITAL_COMMUNITY)
Admission: RE | Admit: 2023-05-02 | Discharge: 2023-05-02 | Disposition: A | Discharge status: Home / Self Care | Payer: Managed Care, Other (non HMO) | Attending: Orthopedic Surgery | Admitting: Orthopedic Surgery

## 2023-05-02 ENCOUNTER — Encounter (HOSPITAL_COMMUNITY): Payer: Self-pay | Admitting: Orthopedic Surgery

## 2023-05-02 ENCOUNTER — Other Ambulatory Visit: Payer: Self-pay

## 2023-05-02 ENCOUNTER — Encounter (HOSPITAL_COMMUNITY)
Admission: RE | Disposition: A | Discharge status: Home / Self Care | Payer: Self-pay | Source: Home / Self Care | Attending: Orthopedic Surgery

## 2023-05-02 ENCOUNTER — Ambulatory Visit (HOSPITAL_BASED_OUTPATIENT_CLINIC_OR_DEPARTMENT_OTHER): Payer: Managed Care, Other (non HMO) | Admitting: Physician Assistant

## 2023-05-02 DIAGNOSIS — M1990 Unspecified osteoarthritis, unspecified site: Secondary | ICD-10-CM | POA: Insufficient documentation

## 2023-05-02 DIAGNOSIS — Z87891 Personal history of nicotine dependence: Secondary | ICD-10-CM | POA: Diagnosis not present

## 2023-05-02 DIAGNOSIS — E039 Hypothyroidism, unspecified: Secondary | ICD-10-CM | POA: Diagnosis not present

## 2023-05-02 DIAGNOSIS — L089 Local infection of the skin and subcutaneous tissue, unspecified: Secondary | ICD-10-CM

## 2023-05-02 DIAGNOSIS — I4510 Unspecified right bundle-branch block: Secondary | ICD-10-CM | POA: Diagnosis not present

## 2023-05-02 DIAGNOSIS — M868X4 Other osteomyelitis, hand: Secondary | ICD-10-CM | POA: Insufficient documentation

## 2023-05-02 DIAGNOSIS — G4733 Obstructive sleep apnea (adult) (pediatric): Secondary | ICD-10-CM | POA: Diagnosis not present

## 2023-05-02 HISTORY — PX: I & D EXTREMITY: SHX5045

## 2023-05-02 LAB — CBC
HCT: 42.1 % (ref 36.0–46.0)
Hemoglobin: 13.8 g/dL (ref 12.0–15.0)
MCH: 30.9 pg (ref 26.0–34.0)
MCHC: 32.8 g/dL (ref 30.0–36.0)
MCV: 94.2 fL (ref 80.0–100.0)
Platelets: 228 10*3/uL (ref 150–400)
RBC: 4.47 MIL/uL (ref 3.87–5.11)
RDW: 12.3 % (ref 11.5–15.5)
WBC: 7.5 10*3/uL (ref 4.0–10.5)
nRBC: 0 % (ref 0.0–0.2)

## 2023-05-02 LAB — BASIC METABOLIC PANEL
Anion gap: 11 (ref 5–15)
BUN: 17 mg/dL (ref 8–23)
CO2: 25 mmol/L (ref 22–32)
Calcium: 9.4 mg/dL (ref 8.9–10.3)
Chloride: 105 mmol/L (ref 98–111)
Creatinine, Ser: 0.47 mg/dL (ref 0.44–1.00)
GFR, Estimated: >60 mL/min (ref >60)
Glucose, Bld: 95 mg/dL (ref 70–99)
Potassium: 3.4 mmol/L — ABNORMAL LOW (ref 3.5–5.1)
Sodium: 141 mmol/L (ref 135–145)

## 2023-05-02 SURGERY — IRRIGATION AND DEBRIDEMENT EXTREMITY
Anesthesia: General | Site: Thumb | Laterality: Left

## 2023-05-02 MED ORDER — 0.9 % SODIUM CHLORIDE (POUR BTL) OPTIME
TOPICAL | Status: DC | PRN
  Administered 2023-05-02: 1000 mL

## 2023-05-02 MED ORDER — CHLORHEXIDINE GLUCONATE 0.12 % MT SOLN: 15.0000 mL | Freq: Once | OROMUCOSAL | Status: AC

## 2023-05-02 MED ORDER — ORAL CARE MOUTH RINSE: 15.0000 mL | Freq: Once | OROMUCOSAL | Status: AC

## 2023-05-02 MED ORDER — CHLORHEXIDINE GLUCONATE 0.12 % MT SOLN
OROMUCOSAL | Status: AC
  Administered 2023-05-02: 15 mL via OROMUCOSAL
  Filled 2023-05-02: qty 15

## 2023-05-02 MED ORDER — BUPIVACAINE HCL (PF) 0.25 % IJ SOLN
INTRAMUSCULAR | Status: AC
  Filled 2023-05-02: qty 30

## 2023-05-02 MED ORDER — GLYCOPYRROLATE PF 0.2 MG/ML IJ SOSY
PREFILLED_SYRINGE | INTRAMUSCULAR | Status: DC | PRN
  Administered 2023-05-02: .2 mg via INTRAVENOUS

## 2023-05-02 MED ORDER — ONDANSETRON HCL 4 MG/2ML IJ SOLN: 4.0000 mg | Freq: Four times a day (QID) | INTRAMUSCULAR | Status: DC | PRN

## 2023-05-02 MED ORDER — GENTAMICIN SULFATE 40 MG/ML IJ SOLN
INTRAMUSCULAR | Status: DC | PRN
  Administered 2023-05-02: 120 mg

## 2023-05-02 MED ORDER — FENTANYL CITRATE (PF) 250 MCG/5ML IJ SOLN
INTRAMUSCULAR | Status: AC
  Filled 2023-05-02: qty 5

## 2023-05-02 MED ORDER — GENTAMICIN SULFATE 40 MG/ML IJ SOLN
INTRAMUSCULAR | Status: AC
  Filled 2023-05-02: qty 6

## 2023-05-02 MED ORDER — CIPROFLOXACIN HCL 500 MG PO TABS: 500.0000 mg | ORAL_TABLET | Freq: Two times a day (BID) | ORAL | 0 refills | Status: DC

## 2023-05-02 MED ORDER — FENTANYL CITRATE (PF) 100 MCG/2ML IJ SOLN
25.0000 ug | INTRAMUSCULAR | Status: DC | PRN
  Administered 2023-05-02: 50 ug via INTRAVENOUS

## 2023-05-02 MED ORDER — EPHEDRINE 5 MG/ML INJ
INTRAVENOUS | Status: AC
  Filled 2023-05-02: qty 5

## 2023-05-02 MED ORDER — SODIUM CHLORIDE 0.9 % IR SOLN
Status: DC | PRN
  Administered 2023-05-02: 3000 mL

## 2023-05-02 MED ORDER — MIDAZOLAM HCL 5 MG/5ML IJ SOLN
INTRAMUSCULAR | Status: DC | PRN
  Administered 2023-05-02 (×2): 1 mg via INTRAVENOUS

## 2023-05-02 MED ORDER — VANCOMYCIN HCL 1000 MG IV SOLR
INTRAVENOUS | Status: AC
  Filled 2023-05-02: qty 20

## 2023-05-02 MED ORDER — ONDANSETRON HCL 4 MG/2ML IJ SOLN
INTRAMUSCULAR | Status: DC | PRN
  Administered 2023-05-02: 4 mg via INTRAVENOUS

## 2023-05-02 MED ORDER — PROPOFOL 10 MG/ML IV BOLUS
INTRAVENOUS | Status: DC | PRN
Start: 2023-05-02 — End: 2023-05-02
  Administered 2023-05-02: 170 mg via INTRAVENOUS

## 2023-05-02 MED ORDER — FENTANYL CITRATE (PF) 100 MCG/2ML IJ SOLN
INTRAMUSCULAR | Status: AC
  Filled 2023-05-02: qty 2

## 2023-05-02 MED ORDER — EPHEDRINE SULFATE-NACL 50-0.9 MG/10ML-% IV SOSY
PREFILLED_SYRINGE | INTRAVENOUS | Status: DC | PRN
  Administered 2023-05-02 (×2): 5 mg via INTRAVENOUS

## 2023-05-02 MED ORDER — LIDOCAINE 2% (20 MG/ML) 5 ML SYRINGE
INTRAMUSCULAR | Status: AC
  Filled 2023-05-02: qty 5

## 2023-05-02 MED ORDER — PROPOFOL 10 MG/ML IV BOLUS
INTRAVENOUS | Status: AC
  Filled 2023-05-02: qty 20

## 2023-05-02 MED ORDER — LACTATED RINGERS IV SOLN: INTRAVENOUS | Status: DC

## 2023-05-02 MED ORDER — LIDOCAINE 2% (20 MG/ML) 5 ML SYRINGE
INTRAMUSCULAR | Status: DC | PRN
  Administered 2023-05-02: 50 mg via INTRAVENOUS

## 2023-05-02 MED ORDER — FENTANYL CITRATE (PF) 250 MCG/5ML IJ SOLN
INTRAMUSCULAR | Status: DC | PRN
  Administered 2023-05-02 (×2): 25 ug via INTRAVENOUS

## 2023-05-02 MED ORDER — MIDAZOLAM HCL 2 MG/2ML IJ SOLN
INTRAMUSCULAR | Status: AC
  Filled 2023-05-02: qty 2

## 2023-05-02 MED ORDER — CEFAZOLIN SODIUM-DEXTROSE 2-3 GM-%(50ML) IV SOLR
INTRAVENOUS | Status: DC | PRN
  Administered 2023-05-02: 2 g via INTRAVENOUS

## 2023-05-02 MED ORDER — VANCOMYCIN HCL 1 G IV SOLR
INTRAVENOUS | Status: DC | PRN
Start: 2023-05-02 — End: 2023-05-02
  Administered 2023-05-02: 1000 mg via TOPICAL

## 2023-05-02 MED ORDER — DOXYCYCLINE HYCLATE 100 MG PO TABS: 100.0000 mg | ORAL_TABLET | Freq: Two times a day (BID) | ORAL | 0 refills | Status: AC

## 2023-05-02 MED ORDER — DEXAMETHASONE SODIUM PHOSPHATE 10 MG/ML IJ SOLN
INTRAMUSCULAR | Status: DC | PRN
  Administered 2023-05-02: 5 mg via INTRAVENOUS

## 2023-05-02 SURGICAL SUPPLY — 72 items
APL PRP STRL LF DISP 70% ISPRP (MISCELLANEOUS) ×1
BAG COUNTER SPONGE SURGICOUNT (BAG) ×1 IMPLANT
BAG SPNG CNTER NS LX DISP (BAG)
BNDG CMPR 5X3 KNIT ELC UNQ LF (GAUZE/BANDAGES/DRESSINGS) ×1
BNDG CMPR 5X4 KNIT ELC UNQ LF (GAUZE/BANDAGES/DRESSINGS) ×1
BNDG CMPR 75X21 PLY HI ABS (MISCELLANEOUS)
BNDG CMPR 9X4 STRL LF SNTH (GAUZE/BANDAGES/DRESSINGS)
BNDG ELASTIC 2X5.8 VLCR STR LF (GAUZE/BANDAGES/DRESSINGS) ×1 IMPLANT
BNDG ELASTIC 3INX 5YD STR LF (GAUZE/BANDAGES/DRESSINGS) ×1 IMPLANT
BNDG ELASTIC 4INX 5YD STR LF (GAUZE/BANDAGES/DRESSINGS) IMPLANT
BNDG ELASTIC 4X5.8 VLCR STR LF (GAUZE/BANDAGES/DRESSINGS) ×1 IMPLANT
BNDG ESMARK 4X9 LF (GAUZE/BANDAGES/DRESSINGS) IMPLANT
BNDG GAUZE DERMACEA FLUFF 4 (GAUZE/BANDAGES/DRESSINGS) ×2 IMPLANT
BNDG GZE DERMACEA 4 6PLY (GAUZE/BANDAGES/DRESSINGS)
CHLORAPREP W/TINT 26 (MISCELLANEOUS) ×1 IMPLANT
CORD BIPOLAR FORCEPS 12FT (ELECTRODE) ×1 IMPLANT
COVER BACK TABLE 60X90IN (DRAPES) IMPLANT
COVER MAYO STAND STRL (DRAPES) ×1 IMPLANT
COVER SURGICAL LIGHT HANDLE (MISCELLANEOUS) ×1 IMPLANT
CUFF TOURN SGL QUICK 18X4 (TOURNIQUET CUFF) ×1 IMPLANT
CUFF TOURN SGL QUICK 24 (TOURNIQUET CUFF)
CUFF TRNQT CYL 24X4X16.5-23 (TOURNIQUET CUFF) IMPLANT
DRAIN PENROSE 18X1/4 LTX STRL (DRAIN) IMPLANT
DRAPE HALF SHEET 40X57 (DRAPES) ×1 IMPLANT
DRAPE OEC MINIVIEW 54X84 (DRAPES) IMPLANT
DRAPE SURG 17X23 STRL (DRAPES) ×1 IMPLANT
DRSG ADAPTIC 3X8 NADH LF (GAUZE/BANDAGES/DRESSINGS) ×1 IMPLANT
DRSG XEROFORM 1X8 (GAUZE/BANDAGES/DRESSINGS) IMPLANT
GAUZE SPONGE 4X4 12PLY STRL (GAUZE/BANDAGES/DRESSINGS) ×1 IMPLANT
GAUZE STRETCH 2X75IN STRL (MISCELLANEOUS) IMPLANT
GAUZE XEROFORM 1X8 LF (GAUZE/BANDAGES/DRESSINGS) ×1 IMPLANT
GLOVE BIO SURGEON STRL SZ7 (GLOVE) ×1 IMPLANT
GLOVE SURG UNDER POLY LF SZ7 (GLOVE) ×1 IMPLANT
GOWN STRL REUS W/ TWL XL LVL3 (GOWN DISPOSABLE) ×2 IMPLANT
GOWN STRL REUS W/TWL XL LVL3 (GOWN DISPOSABLE) ×2
IV CATH 18G X1.75 CATHLON (IV SOLUTION) IMPLANT
KIT BASIN OR (CUSTOM PROCEDURE TRAY) ×1 IMPLANT
KIT STIMULAN RAPID CURE 10CC (Orthopedic Implant) IMPLANT
KIT TURNOVER KIT B (KITS) ×1 IMPLANT
LOOP VASCLR MAXI BLUE 18IN ST (MISCELLANEOUS) IMPLANT
LOOP VASCULAR MAXI 18 BLUE (MISCELLANEOUS)
MANIFOLD NEPTUNE II (INSTRUMENTS) IMPLANT
NDL HYPO 25GX1X1/2 BEV (NEEDLE) IMPLANT
NDL HYPO 25X1 1.5 SAFETY (NEEDLE) ×1 IMPLANT
NDL KEITH (NEEDLE) IMPLANT
NEEDLE HYPO 25GX1X1/2 BEV (NEEDLE)
NEEDLE HYPO 25X1 1.5 SAFETY (NEEDLE)
NEEDLE KEITH (NEEDLE)
NS IRRIG 1000ML POUR BTL (IV SOLUTION) ×1 IMPLANT
PACK ORTHO EXTREMITY (CUSTOM PROCEDURE TRAY) ×1 IMPLANT
PAD ABD 8X10 STRL (GAUZE/BANDAGES/DRESSINGS) ×1 IMPLANT
PAD ARMBOARD 7.5X6 YLW CONV (MISCELLANEOUS) ×1 IMPLANT
PAD CAST 3X4 CTTN HI CHSV (CAST SUPPLIES) IMPLANT
PAD CAST 4YDX4 CTTN HI CHSV (CAST SUPPLIES) ×2 IMPLANT
PADDING CAST ABS COTTON 3X4 (CAST SUPPLIES) ×1 IMPLANT
PADDING CAST COTTON 3X4 STRL (CAST SUPPLIES) ×1
PADDING CAST COTTON 4X4 STRL (CAST SUPPLIES) ×1
SPLINT FIBERGLASS 4X30 (CAST SUPPLIES) IMPLANT
SPLINT PLASTER CAST XFAST 3X15 (CAST SUPPLIES) ×1 IMPLANT
SPONGE T-LAP 4X18 ~~LOC~~+RFID (SPONGE) ×1 IMPLANT
SUT FIBERWIRE 3-0 18 TAPR NDL (SUTURE)
SUTURE FIBERWR 3-0 18 TAPR NDL (SUTURE) IMPLANT
SWAB CULTURE ESWAB REG 1ML (MISCELLANEOUS) IMPLANT
SYR BULB EAR ULCER 3OZ GRN STR (SYRINGE) ×1 IMPLANT
SYR CONTROL 10ML LL (SYRINGE) IMPLANT
TOWEL GREEN STERILE (TOWEL DISPOSABLE) ×1 IMPLANT
TOWEL GREEN STERILE FF (TOWEL DISPOSABLE) ×2 IMPLANT
TUBE CONNECTING 12X1/4 (SUCTIONS) IMPLANT
UNDERPAD 30X36 HEAVY ABSORB (UNDERPADS AND DIAPERS) ×1 IMPLANT
VASCULAR TIE MAXI BLUE 18IN ST (MISCELLANEOUS)
WATER STERILE IRR 1000ML POUR (IV SOLUTION) ×1 IMPLANT
YANKAUER SUCT BULB TIP NO VENT (SUCTIONS) IMPLANT

## 2023-05-02 NOTE — Anesthesia Procedure Notes (Signed)
Procedure Name: LMA Insertion Date/Time: 05/02/2023 1:48 PM  Performed by: Waynard Edwards, CRNAPre-anesthesia Checklist: Patient identified, Emergency Drugs available, Suction available and Patient being monitored Patient Re-evaluated:Patient Re-evaluated prior to induction Oxygen Delivery Method: Circle System Utilized Preoxygenation: Pre-oxygenation with 100% oxygen Induction Type: IV induction Ventilation: Mask ventilation without difficulty LMA: LMA inserted LMA Size: 4.0 Number of attempts: 1 Placement Confirmation: positive ETCO2 Tube secured with: Tape Dental Injury: Teeth and Oropharynx as per pre-operative assessment

## 2023-05-02 NOTE — Op Note (Signed)
Date of Surgery: 05/02/2023  INDICATIONS: Patient is a 62 y.o.-year-old female with osteomyelitis of the left thumb proximal phalanx.  She initially underwent irrigation and debridement by me approximately 1 month ago.  At that time there was significant bony destruction.  She underwent debridement of the large portion of the proximal phalanx with placement of stimulan beads impregnated with vancomycin and gentamicin.  She has done well postoperatively overall with significant improvement of the swelling of her thumb and resolution of the erythema, however, the wound was left open to drain and has developed into a chronic wound.  There is no drainage from this area, however, there is no appearance of healthy granulation tissue and no evidence of ongoing healing by secondary intention.  I recommended excision of this wound with irrigation and debridement and partial closure.  Patient and I had several discussions regarding the reconstructive process for her thumb.  The first step will be complete eradication of the infection and the development of a healthy soft tissue envelope.  Patient is on chronic oral antibiotics per infectious disease.  She has a follow-up with them in the very near future.  Te risks, benefits, and alternatives to surgery were again discussed with the patient in the preoperative area. The patient wishes to proceed with surgery.  Informed consent was signed after our discussion.   PREOPERATIVE DIAGNOSIS:  Osteomyelitis of left thumb proximal phalanx  POSTOPERATIVE DIAGNOSIS: Same.  PROCEDURE:  Irrigation and debridement of left thumb with curettage of proximal phalanx (56213) Placement of Stimulan beads with vancomycin and gentamicin into defect of proximal phalanx (08657)   SURGEON: Waylan Rocher, M.D.  ASSIST: None  ANESTHESIA:  general  IV FLUIDS AND URINE: See anesthesia.  ESTIMATED BLOOD LOSS: <5 mL.  IMPLANTS:  Implant Name Type Inv. Item Serial No.  Manufacturer Lot No. LRB No. Used Action  KIT STIMULAN RAPID CURE 10CC - [id number] Orthopedic Implant KIT STIMULAN RAPID CURE 10CC  [employer] [id number] Left 1 Implanted     DRAINS: None  COMPLICATIONS: None  DESCRIPTION OF PROCEDURE: The patient was met in the preoperative holding area where the surgical site was marked and the consent form was signed.  The patient was then taken to the operating room and transferred to the operating table.  All bony prominences were well padded.  A tourniquet was applied to the left forearm.  General endotracheal anesthesia was induced.  The operative extremity was prepped and draped in the usual and sterile fashion.  A formal time-out was performed to confirm that this was the correct patient, surgery, side, and site.   Following formal timeout, the limb was exsanguinated by gravity and the tourniquet inflated to 250 mmHg.  There was a chronic wound at the dorsal aspect of the thumb.  There was no evidence of healing or granulation tissue in the wound bed.  There was no drainage.  The wound was less than 1 cm in length by approximately 0.5 cm in width and extended down to the MCP joint.  This wound was ellipsed in a transverse fashion to best allow for reapproximation of the skin edges.  The range of the wound was extended distally in the radial edge of the wound was extended proximally.  There was no purulence.  The wound was tracking to the MCP joint.  The base of the proximal phalanx is in a somewhat flexed position owing to the large defect in the phalanx following my initial debridement.  There was some stimulan bead material  left in the phalangeal defect.  There is no purulence.  A house curette was used to debride the phalanx.  There was no necrotic, nonviable, or otherwise devitalized bone.  The wound and the phalangeal defect were thoroughly irrigated with copious sterile saline via both low flow cystoscopy tubing and bulb syringe.  Additional stimulan  beads impregnated with vancomycin and gentamicin were then placed into the bony defect.  The transverse portion of the wound was closed using a 4-0 nylon suture in vertical mattress fashion to evert the skin edges to next was the potential for wound healing.  A portion of the longitudinal limbs were left open.  The tourniquet was deflated.  The wound was dressed with Xeroform, 4 x 4's, cast padding, and a well-padded thumb spica splint was applied.  The patient was reversed from anesthesia and extubated uneventfully.  They were transferred from the operating table to the postoperative bed.  All counts were correct x 2 at the end of the procedure.  The patient was then taken to the PACU in stable condition.   POSTOPERATIVE PLAN: She will be discharged to home with appropriate pain medication and discharge instructions.  We will continue the oral antibiotics per infectious disease.  She has a follow-up appointment with them in the very near future to evaluate the antibiotic choice and duration.  She will keep the splint on until she sees me for follow-up.  Waylan Rocher, MD 2:44 PM

## 2023-05-02 NOTE — Interval H&P Note (Signed)
History and Physical Interval Note:  05/02/2023 1:26 PM  Makayla Gibson  has presented today for surgery, with the diagnosis of left thumb infection.  The various methods of treatment have been discussed with the patient and family. After consideration of risks, benefits and other options for treatment, the patient has consented to  Procedure(s) with comments: IRRIGATION AND DEBRIDEMENT EXTREMITY, left thumb (Left) - Regional + MAC or general  60 min as a surgical intervention.  The patient's history has been reviewed, patient examined, no change in status, stable for surgery.  I have reviewed the patient's chart and labs.  Questions were answered to the patient's satisfaction.     Cosby Proby Tiger Spieker

## 2023-05-02 NOTE — Discharge Instructions (Signed)
  Charles Benfield, M.D. Hand Surgery  POST-OPERATIVE DISCHARGE INSTRUCTIONS   PRESCRIPTIONS: You may have been given a prescription to be taken as directed for post-operative pain control.  You may also take over the counter ibuprofen/aleve and tylenol for pain. Take this as directed on the packaging. Do not exceed 3000 mg tylenol/acetaminophen in 24 hours.  Ibuprofen 600-800 mg (3-4) tablets by mouth every 6 hours as needed for pain.  OR Aleve 2 tablets by mouth every 12 hours (twice daily) as needed for pain.  AND/OR Tylenol 1000 mg (2 tablets) every 8 hours as needed for pain.  Please use your pain medication carefully, as refills are limited and you may not be provided with one.  As stated above, please use over the counter pain medicine - it will also be helpful with decreasing your swelling.    ANESTHESIA: After your surgery, post-surgical discomfort or pain is likely. This discomfort can last several days to a few weeks. At certain times of the day your discomfort may be more intense.   Did you receive a nerve block?  A nerve block can provide pain relief for one hour to two days after your surgery. As long as the nerve block is working, you will experience little or no sensation in the area the surgeon operated on.  As the nerve block wears off, you will begin to experience pain or discomfort. It is very important that you begin taking your prescribed pain medication before the nerve block fully wears off. Treating your pain at the first sign of the block wearing off will ensure your pain is better controlled and more tolerable when full-sensation returns. Do not wait until the pain is intolerable, as the medicine will be less effective. It is better to treat pain in advance than to try and catch up.   General Anesthesia:  If you did not receive a nerve block during your surgery, you will need to start taking your pain medication shortly after your surgery and should continue  to do so as prescribed by your surgeon.     ICE AND ELEVATION: You may use ice for the first 48-72 hours, but it is not critical.   Motion of your fingers is very important to decrease the swelling.  Elevation, as much as possible for the next 48 hours, is critical for decreasing swelling as well as for pain relief. Elevation means when you are seated or lying down, you hand should be at or above your heart. When walking, the hand needs to be at or above the level of your elbow.  If the bandage gets too tight, it may need to be loosened. Please contact our office and we will instruct you in how to do this.    SURGICAL BANDAGES:  Keep your dressing and/or splint clean and dry at all times.  Do not remove until you are seen again in the office.  If careful, you may place a plastic bag over your bandage and tape the end to shower, but be careful, do not get your bandages wet.     HAND THERAPY:  You may not need any. If you do, we will begin this at your follow up visit in the clinic.    ACTIVITY AND WORK: You are encouraged to move any fingers which are not in the bandage.  Light use of the fingers is allowed to assist the other hand with daily hygiene and eating, but strong gripping or lifting is often uncomfortable and   should be avoided.  You might miss a variable period of time from work and hopefully this issue has been discussed prior to surgery. You may not do any heavy work with your affected hand for about 2 weeks.    [facility] Second Floor, [address] [place], [place] [address] [phone]  

## 2023-05-02 NOTE — H&P (Signed)
HAND SURGERY   HPI: Patient is a 62 y.o. female who presents with chronic osteomyelitis of her left thumb. She is s/p irrigation and debridement of soft tissue and necrotic proximal phalanx.  The initial incision was left partially open to drain and heal by secondary intention, however, the wound has not healed with no evidence of granulation tissue or ongoing healing by secondary intention.  The plan today is for repeat I&D of her thumb.  Patient denies any changes to their medical history or new systemic symptoms today.    Past Medical History:  Diagnosis Date   Abscess of breast 1202017   Anemia    Arthritis    Heart murmur    as a child   Hypothyroidism    Osteoporosis    Sleep apnea    Thyroid disease 11/2014   Past Surgical History:  Procedure Laterality Date   APPENDECTOMY     BREAST CYST EXCISION Right 09/06/2016   BREAST LUMPECTOMY Left 01/29/2018   Procedure: LEFT BREAST LUMPECTOMY;  Surgeon: Griselda Miner, MD;  Location: [facility] OR;  Service: General;  Laterality: Left;   BREAST REDUCTION SURGERY Left 01/29/2018   Procedure: MAMMARY REDUCTION  (BREAST) FOR SYMETRY;  Surgeon: Peggye Form, DO;  Location: [facility] OR;  Service: Plastics;  Laterality: Left;   CESAREAN SECTION     4   DEBRIDEMENT AND CLOSURE WOUND Left 01/29/2018   Procedure: DEBRIDEMENT AND CLOSURE WOUND;  Surgeon: Peggye Form, DO;  Location: [facility] OR;  Service: Plastics;  Laterality: Left;   I & D EXTREMITY Left 03/29/2023   Procedure: IRRIGATION AND DEBRIDEMENT OF THUMB, CURRETAGE OF LEFT THUMB PROXIMAL PHALANX, AND PLACEMENT OF CEMENT ANTIBIOTIC SPACER;  Surgeon: Marlyne Beards, MD;  Location: [facility] OR;  Service: Orthopedics;  Laterality: Left;   INCISION AND DRAINAGE ABSCESS Right 07/29/2015   Procedure: INCISION AND DRAINAGE BREAST ABSCESS;  Surgeon: Franky Macho, MD;  Location: [facility];  Service: General;  Laterality: Right;   MASTECTOMY, PARTIAL Right 09/06/2016   Procedure: EXCISION OF LARGE RIGHT  BREAST CHRONIC ABSCESS CAVITY;  Surgeon: Chevis Pretty III, MD;  Location: [facility] OR;  Service: General;  Laterality: Right;   Social History   Socioeconomic History   Marital status: Married    Spouse name: Not on file   Number of children: Not on file   Years of education: Not on file   Highest education level: Not on file  Occupational History   Not on file  Tobacco Use   Smoking status: Former    Current packs/day: 0.00    Types: Cigarettes    Quit date: 10/28/2008    Years since quitting: 14.5   Smokeless tobacco: Never  Vaping Use   Vaping status: Never Used  Substance and Sexual Activity   Alcohol use: Not Currently    Comment: In the past   Drug use: No   Sexual activity: Never  Other Topics Concern   Not on file  Social History Narrative   Not on file   Social Determinants of Health   Financial Resource Strain: Not on file  Food Insecurity: Not on file  Transportation Needs: Not on file  Physical Activity: Not on file  Stress: Not on file  Social Connections: Unknown (02/12/2022)   Received from [facility]   Social Network    Social Network: Not on file   Family History  Problem Relation Age of Onset   Rheumatic fever Mother    Heart disease Mother  Osteoporosis Mother    Alcohol abuse Father    Cancer Brother        liver   Osteoporosis Brother    Heart disease Brother    Breast cancer Neg Hx    - negative except otherwise stated in the family history section Allergies  Allergen Reactions   Keflex [Cephalexin] Itching and Rash   Percocet [Oxycodone-Acetaminophen] Itching and Rash    Pt can take oxycodone and tylenol separately    Percodan [Oxycodone-Aspirin] Itching and Rash    Pt can take oxycodone and aspirin separately    Prior to Admission medications   Medication Sig Start Date End Date Taking? Authorizing Provider  acetaminophen (TYLENOL) 500 MG tablet Take 1,000 mg by mouth daily as needed for headache.   Yes [provider]   alendronate (FOSAMAX) 70 MG tablet Take with a full glass of water on an empty stomach. 05/01/22  Yes Mechele Claude, MD  atorvastatin (LIPITOR) 40 MG tablet Take 1 tablet (40 mg total) by mouth daily. Take for cholesterol control 05/01/22  Yes Stacks, Broadus John, MD  ciprofloxacin (CIPRO) 500 MG tablet Take 500 mg by mouth 2 (two) times daily.   Yes [provider]  doxycycline (VIBRA-TABS) 100 MG tablet Take 100 mg by mouth 2 (two) times daily. 04/12/23  Yes [provider]  levothyroxine (SYNTHROID) 125 MCG tablet Take 1 tablet (125 mcg total) by mouth daily. 05/01/22  Yes Stacks, Broadus John, MD  meloxicam (MOBIC) 15 MG tablet TAKE 1 TABLET (15 MG TOTAL) BY MOUTH DAILY WITH SUPPER 03/04/23  Yes Stacks, Broadus John, MD  Multiple Vitamins-Minerals (ADULT GUMMY PO) Take 2 capsules by mouth daily.   Yes [provider]  Naphazoline-Glycerin (CLEAR EYES MAX REDNESS RELIEF) 0.03-0.5 % SOLN Place 1 drop into both eyes 2 (two) times daily as needed (redness).   Yes [provider]  rOPINIRole (REQUIP) 1 MG tablet TAKE 1 TABLET (1 MG TOTAL) BY MOUTH AT BEDTIME. FOR LEG CRAMPS 04/11/23  Yes Mechele Claude, MD  Vitamin D, Ergocalciferol, (DRISDOL) 1.25 MG (50000 UNIT) CAPS capsule Take 1 capsule (50,000 Units total) by mouth every 7 (seven) days. 11/06/22  Yes Mechele Claude, MD  furosemide (LASIX) 40 MG tablet Take 1 tablet (40 mg total) by mouth daily. Patient not taking: Reported on 05/01/2023 04/15/23   Mechele Claude, MD  potassium chloride (MICRO-K) 10 MEQ CR capsule Take 1 capsule (10 mEq total) by mouth daily. TAKE 1 CAPSULE BY MOUTH EVERY DAY Patient not taking: Reported on 05/01/2023 05/01/22   Mechele Claude, MD   DG MINI C-ARM IMAGE ONLY  Result Date: 05/02/2023 There is no interpretation for this exam.  This order is for images obtained during a surgical procedure.  Please See "Surgeries" Tab for more information regarding the procedure.   - Positive ROS: All other systems have been  reviewed and were otherwise negative with the exception of those mentioned in the HPI and as above.  Physical Exam: General: No acute distress, resting comfortably Cardiovascular: BUE warm and well perfused, normal rate Respiratory: Normal WOB on RA Skin: Warm and dry Neurologic: Sensation intact distally Psychiatric: Patient is at baseline mood and affect  Left Upper Extremity  Thumb swelling is stable from her most recent office visit and much improved from her initial presentation.  There is a small ~1 cm wound at the dorsal and central aspect of the thumb over the proximal phalanx.  The thumb is warm and well perfused w/ BCR.  SILT at radial and  ulnar border of the thumb.   Assessment: 62 yo F w/ chronic osteomyelitis of the left thumb proximal phalanx with persisent wound  Plan: OR today for I&D of right thumb with possible insertion of additional antibiotic beads, possible further debridement of nonviable bone, and possible instertion of antibiotic beds. . We again reviewed the risks of surgery which include, but are not limited to,  bleeding, persistent or worsening infection, damage to neurovascular structures, persistent symptoms, delayed wound healing, need for additional surgery.  Informed consent was signed.  All questions were answered.   Marlyne Beards, M.D. [facility] 1:22 PM

## 2023-05-02 NOTE — Transfer of Care (Signed)
Immediate Anesthesia Transfer of Care Note  Patient: Makayla Gibson  Procedure(s) Performed: IRRIGATION AND DEBRIDEMENT OF LEFT THUMB WITH PLACEMENT OF ANTIBIOTIC CEMENT (Left: Thumb)  Patient Location: PACU  Anesthesia Type:General  Level of Consciousness: awake, alert , oriented, and patient cooperative  Airway & Oxygen Therapy: Patient Spontanous Breathing and Patient connected to nasal cannula oxygen  Post-op Assessment: Report given to RN, Post -op Vital signs reviewed and stable, and Patient moving all extremities X 4  Post vital signs: Reviewed and stable  Last Vitals:  Vitals Value Taken Time  BP    Temp    Pulse 75 05/02/23 1443  Resp 18 05/02/23 1443  SpO2 97 % 05/02/23 1443  Vitals shown include unfiled device data.  Last Pain:  Vitals:   05/02/23 1131  TempSrc:   PainSc: 0-No pain         Complications: No notable events documented.

## 2023-05-03 ENCOUNTER — Encounter (HOSPITAL_COMMUNITY): Payer: Self-pay | Admitting: Orthopedic Surgery

## 2023-05-03 NOTE — Anesthesia Postprocedure Evaluation (Signed)
Anesthesia Post Note  Patient: Makayla Gibson  Procedure(s) Performed: IRRIGATION AND DEBRIDEMENT OF LEFT THUMB WITH PLACEMENT OF ANTIBIOTIC CEMENT (Left: Thumb)     Patient location during evaluation: PACU Anesthesia Type: General Level of consciousness: awake and alert Pain management: pain level controlled Vital Signs Assessment: post-procedure vital signs reviewed and stable Respiratory status: spontaneous breathing, nonlabored ventilation, respiratory function stable and patient connected to nasal cannula oxygen Cardiovascular status: blood pressure returned to baseline and stable Postop Assessment: no apparent nausea or vomiting Anesthetic complications: no   No notable events documented.  Last Vitals:  Vitals:   05/02/23 1500 05/02/23 1515  BP: 135/81 (!) 137/92  Pulse: 68 61  Resp: 15 12  Temp:  36.5 C  SpO2: 95% 93%    Last Pain:  Vitals:   05/02/23 1515  TempSrc:   PainSc: Asleep                 [name]

## 2023-05-05 ENCOUNTER — Other Ambulatory Visit: Payer: Self-pay | Admitting: Family Medicine

## 2023-05-06 ENCOUNTER — Other Ambulatory Visit: Payer: Self-pay | Admitting: Family Medicine

## 2023-05-06 ENCOUNTER — Ambulatory Visit: Payer: Managed Care, Other (non HMO) | Admitting: Family Medicine

## 2023-05-06 ENCOUNTER — Encounter: Payer: Self-pay | Admitting: Family Medicine

## 2023-05-06 VITALS — BP 159/83 | HR 55 | Temp 97.8°F | Ht 63.0 in | Wt 219.0 lb

## 2023-05-06 DIAGNOSIS — R600 Localized edema: Secondary | ICD-10-CM

## 2023-05-06 DIAGNOSIS — E782 Mixed hyperlipidemia: Secondary | ICD-10-CM | POA: Diagnosis not present

## 2023-05-06 DIAGNOSIS — R001 Bradycardia, unspecified: Secondary | ICD-10-CM

## 2023-05-06 DIAGNOSIS — E559 Vitamin D deficiency, unspecified: Secondary | ICD-10-CM | POA: Diagnosis not present

## 2023-05-06 DIAGNOSIS — E038 Other specified hypothyroidism: Secondary | ICD-10-CM | POA: Diagnosis not present

## 2023-05-06 DIAGNOSIS — I4891 Unspecified atrial fibrillation: Secondary | ICD-10-CM

## 2023-05-06 MED ORDER — POTASSIUM CHLORIDE ER 10 MEQ PO CPCR: 10.0000 meq | ORAL_CAPSULE | Freq: Every day | ORAL | 3 refills | Status: DC

## 2023-05-06 MED ORDER — LEVOTHYROXINE SODIUM 125 MCG PO TABS
125.0000 ug | ORAL_TABLET | Freq: Every day | ORAL | 3 refills | Status: DC
Start: 2023-05-06 — End: 2023-05-17

## 2023-05-06 MED ORDER — ATORVASTATIN CALCIUM 40 MG PO TABS: 40.0000 mg | ORAL_TABLET | Freq: Every day | ORAL | 3 refills | Status: DC

## 2023-05-06 NOTE — Progress Notes (Signed)
Subjective:  Patient ID: Makayla Gibson, female    DOB: [birth year]  Age: 62 y.o. MRN: [id number]  CC: Medical Management of Chronic Gibson   HPI Makayla Gibson presents  follow-up on  thyroid. The patient has a history of hypothyroidism for many years. It has been stable recently. Pt. denies any change in  voice, loss of hair, heat or cold intolerance. Energy level has been adequate to good. Patient denies constipation and diarrhea. No myxedema. Medication is as noted below. Verified that pt is taking it daily on an empty stomach. Well tolerated.   in for follow-up of elevated cholesterol. Doing well without complaints on current medication. Denies side effects of statin including myalgia and arthralgia and nausea. Currently no chest pain, shortness of breath or other cardiovascular related symptoms noted.  Zio reviewed with pt. Shows both bradycardia to 20s as well as A fib with RVR into the 150s. 3 pauses noted of 3,3 & 4 seconds.   Having weekly debridement of thumb due to osteomyelitis. Next in 3 days. Also has I.D. referral .      05/06/2023    3:53 PM 05/06/2023    3:40 PM 04/15/2023    2:21 PM  Depression screen PHQ 2/9  Decreased Interest 0 0 0  Down, Depressed, Hopeless 1 0 0  PHQ - 2 Score 1 0 0  Altered sleeping 0    Tired, decreased energy 0    Change in appetite 0    Feeling bad or failure about yourself  0    Trouble concentrating 0    Moving slowly or fidgety/restless 0    Suicidal thoughts 0    PHQ-9 Score 1    Difficult doing work/chores Not difficult at all      History Makayla Gibson has a past medical history of Abscess of breast (4098119), Anemia, Arthritis, Heart murmur, Hypothyroidism, Osteoporosis, Sleep apnea, and Thyroid disease (11/2014).   Makayla Gibson has a past surgical history that includes Cesarean section; Appendectomy; Incision and drainage abscess (Right, 07/29/2015); Breast cyst excision (Right, 09/06/2016); Mastectomy, partial (Right, 09/06/2016);  Breast lumpectomy (Left, 01/29/2018); Debridement and closure wound (Left, 01/29/2018); Breast reduction surgery (Left, 01/29/2018); I & D extremity (Left, 03/29/2023); and I & D extremity (Left, 05/02/2023).   Her family history includes Alcohol abuse in her father; Cancer in her brother; Heart disease in her brother and mother; Osteoporosis in her brother and mother; Rheumatic fever in her mother.Makayla Gibson reports that Makayla Gibson quit smoking about 14 years ago. Her smoking use included cigarettes. Makayla Gibson has never used smokeless tobacco. Makayla Gibson reports that Makayla Gibson does not currently use alcohol. Makayla Gibson reports that Makayla Gibson does not use drugs.    ROS Review of Systems  Constitutional: Negative.   HENT: Negative.    Eyes:  Negative for visual disturbance.  Respiratory:  Negative for shortness of breath.   Cardiovascular:  Negative for chest pain and palpitations.  Gastrointestinal:  Negative for abdominal pain.  Musculoskeletal:  Positive for arthralgias (right thumb).    Objective:  BP (!) 159/83   Pulse (!) 55   Temp 97.8 F (36.6 C)   Ht 5\' 3"  (1.6 m)   Wt 219 lb (99.3 kg)   SpO2 94%   BMI 38.79 kg/m   BP Readings from Last 3 Encounters:  05/06/23 (!) 159/83  05/02/23 (!) 137/92  04/15/23 107/68    Wt Readings from Last 3 Encounters:  05/06/23 219 lb (99.3 kg)  05/02/23 207 lb (93.9 kg)  04/15/23 214 lb (97.1  kg)     Physical Exam Constitutional:      General: Makayla Gibson is not in acute distress.    Appearance: Makayla Gibson is well-developed.  Cardiovascular:     Rate and Rhythm: Normal rate and regular rhythm.  Pulmonary:     Breath sounds: Normal breath sounds.  Musculoskeletal:        General: Normal range of motion.  Skin:    General: Skin is warm and dry.  Neurological:     Mental Status: Makayla Gibson is alert and oriented to person, place, and time.       Assessment & Plan:   Makayla Gibson.  Diagnoses and all orders for this visit:  Vitamin D  deficiency -     CMP14+EGFR -     VITAMIN D 25 Hydroxy (Vit-D Deficiency, Fractures)  Mixed hyperlipidemia -     atorvastatin (LIPITOR) 40 MG tablet; Take 1 tablet (40 mg total) by mouth daily. Take for cholesterol control -     CMP14+EGFR -     Lipid panel  Other specified hypothyroidism -     levothyroxine (SYNTHROID) 125 MCG tablet; Take 1 tablet (125 mcg total) by mouth daily. -     CMP14+EGFR  Edema of both legs -     potassium chloride (MICRO-K) 10 MEQ CR capsule; Take 1 capsule (10 mEq total) by mouth daily. TAKE 1 CAPSULE BY MOUTH EVERY DAY -     CMP14+EGFR  Bradycardia by electrocardiogram -     Ambulatory referral to Cardiology  Atrial fibrillation with RVR Makayla Gibson) -     Ambulatory referral to Cardiology   I am having Makayla Gibson. Makayla Gibson maintain her acetaminophen, Vitamin D (Ergocalciferol), meloxicam, rOPINIRole, furosemide, Multiple Vitamins-Minerals (ADULT GUMMY PO), Clear Eyes Max Redness Relief, ciprofloxacin, doxycycline, alendronate, atorvastatin, levothyroxine, and potassium chloride.  Allergies as of 05/06/2023       Reactions   Keflex [cephalexin] Itching, Rash   Percocet [oxycodone-acetaminophen] Itching, Rash   Pt can take oxycodone and tylenol separately    Percodan [oxycodone-aspirin] Itching, Rash   Pt can take oxycodone and aspirin separately         Medication List        Accurate as of May 06, 2023  9:29 PM. If you have any questions, ask your nurse or doctor.          acetaminophen 500 MG tablet Commonly known as: TYLENOL Take 1,000 mg by mouth daily as needed for headache.   ADULT GUMMY PO Take 2 capsules by mouth daily.   alendronate 70 MG tablet Commonly known as: FOSAMAX TAKE BY MOUTH ONCE A WEEK WITH A FULL GLASS OF WATER ON AN EMPTY STOMACH.   atorvastatin 40 MG tablet Commonly known as: LIPITOR Take 1 tablet (40 mg total) by mouth daily. Take for cholesterol control   ciprofloxacin 500 MG tablet Commonly known as:  CIPRO Take 1 tablet (500 mg total) by mouth 2 (two) times daily for 14 days.   Clear Eyes Max Redness Relief 0.03-0.5 % Soln Generic drug: Naphazoline-Glycerin Place 1 drop into both eyes 2 (two) times daily as needed (redness).   doxycycline 100 MG tablet Commonly known as: VIBRA-TABS Take 1 tablet (100 mg total) by mouth 2 (two) times daily for 14 days.   furosemide 40 MG tablet Commonly known as: LASIX Take 1 tablet (40 mg total) by mouth daily.   levothyroxine 125 MCG tablet Commonly known as: SYNTHROID Take 1 tablet (125 mcg  total) by mouth daily.   meloxicam 15 MG tablet Commonly known as: MOBIC TAKE 1 TABLET (15 MG TOTAL) BY MOUTH DAILY WITH SUPPER   potassium chloride 10 MEQ CR capsule Commonly known as: MICRO-K Take 1 capsule (10 mEq total) by mouth daily. TAKE 1 CAPSULE BY MOUTH EVERY DAY   rOPINIRole 1 MG tablet Commonly known as: REQUIP TAKE 1 TABLET (1 MG TOTAL) BY MOUTH AT BEDTIME. FOR LEG CRAMPS   Vitamin D (Ergocalciferol) 1.25 MG (50000 UNIT) Caps capsule Commonly known as: DRISDOL Take 1 capsule (50,000 Units total) by mouth every 7 (seven) days.       Due to both brady and tachy arrhythmias, neither can be treated medically at this time. Since Makayla Gibson is having debridement weekly for osteomyelitis, anticoagulation would be problematic. Will try to get cardiology referral expedited. May even need a pacemaker so that the a. Fib can  be treated.   Follow-up: Return in about 1 month (around 06/06/2023).  Mechele Claude, M.D.

## 2023-05-07 ENCOUNTER — Ambulatory Visit: Payer: Managed Care, Other (non HMO) | Admitting: Internal Medicine

## 2023-05-07 ENCOUNTER — Encounter: Payer: Self-pay | Admitting: Internal Medicine

## 2023-05-07 ENCOUNTER — Other Ambulatory Visit: Payer: Self-pay

## 2023-05-07 VITALS — BP 106/69 | HR 50 | Temp 97.8°F | Ht 63.0 in | Wt 219.0 lb

## 2023-05-07 DIAGNOSIS — M869 Osteomyelitis, unspecified: Secondary | ICD-10-CM | POA: Insufficient documentation

## 2023-05-07 DIAGNOSIS — M86142 Other acute osteomyelitis, left hand: Secondary | ICD-10-CM

## 2023-05-07 LAB — SEDIMENTATION RATE: Sed Rate: 6 mm/h (ref 0–30)

## 2023-05-07 NOTE — Progress Notes (Signed)
Hello Makayla Gibson,  Your lab result is normal and/or stable.Some minor variations that are not significant are commonly marked abnormal, but do not represent any medical problem for you.  Best regards, Warren Stacks, M.D.

## 2023-05-07 NOTE — Progress Notes (Signed)
[facility]      Reason for Consult: thumb osteomyelitis    Referring Physician: Dr. Frazier Butt    Patient ID: Makayla Gibson, female    DOB: Dec 23, 1960, 62 y.o.   MRN: [id number]  HPI:   Makayla Gibson is here for evaluation of thumb osteomyelitis. She developed swelling of the thumb and MRI 03/27/23 with destructive osteomyelitis of the proximal phalynx of the thumb and surrounding cellulitis.  She was started on empiric doxycycline and cipro for this and has undergone debridement x 2.  Some wound healing but still open.  It initially started when she used a needle to open up a bump on her thumb then swelling occurred about 2 days later.  Went to urgent care in [place] and I and D with MRSA in culture and returned again for another I and D but infection persisted and MRI as above.  Now s/p debridement x 2.    Past Medical History:  Diagnosis Date   Abscess of breast [id number]   Anemia    Arthritis    Heart murmur    as a child   Hypothyroidism    Osteoporosis    Sleep apnea    Thyroid disease 11/2014    Prior to Admission medications   Medication Sig Start Date End Date Taking? Authorizing Provider  acetaminophen (TYLENOL) 500 MG tablet Take 1,000 mg by mouth daily as needed for headache.    [provider]  alendronate (FOSAMAX) 70 MG tablet TAKE BY MOUTH ONCE A WEEK WITH A FULL GLASS OF WATER ON AN EMPTY STOMACH. 05/06/23   Mechele Claude, MD  atorvastatin (LIPITOR) 40 MG tablet Take 1 tablet (40 mg total) by mouth daily. Take for cholesterol control 05/06/23   Mechele Claude, MD  ciprofloxacin (CIPRO) 500 MG tablet Take 1 tablet (500 mg total) by mouth 2 (two) times daily for 14 days. 05/02/23 05/16/23  Marlyne Beards, MD  doxycycline (VIBRA-TABS) 100 MG tablet Take 1 tablet (100 mg total) by mouth 2 (two) times daily for 14 days. 05/02/23 05/16/23  Marlyne Beards, MD  furosemide (LASIX) 40 MG tablet Take 1 tablet (40 mg total) by mouth daily. Patient  not taking: Reported on 05/06/2023 04/15/23   Mechele Claude, MD  levothyroxine (SYNTHROID) 125 MCG tablet Take 1 tablet (125 mcg total) by mouth daily. 05/06/23   Mechele Claude, MD  meloxicam (MOBIC) 15 MG tablet TAKE 1 TABLET (15 MG TOTAL) BY MOUTH DAILY WITH SUPPER 03/04/23   Mechele Claude, MD  Multiple Vitamins-Minerals (ADULT GUMMY PO) Take 2 capsules by mouth daily.    [provider]  Naphazoline-Glycerin (CLEAR EYES MAX REDNESS RELIEF) 0.03-0.5 % SOLN Place 1 drop into both eyes 2 (two) times daily as needed (redness).    [provider]  potassium chloride (MICRO-K) 10 MEQ CR capsule Take 1 capsule (10 mEq total) by mouth daily. TAKE 1 CAPSULE BY MOUTH EVERY DAY 05/06/23   Mechele Claude, MD  rOPINIRole (REQUIP) 1 MG tablet TAKE 1 TABLET (1 MG TOTAL) BY MOUTH AT BEDTIME. FOR LEG CRAMPS 04/11/23   Mechele Claude, MD  Vitamin D, Ergocalciferol, (DRISDOL) 1.25 MG (50000 UNIT) CAPS capsule Take 1 capsule (50,000 Units total) by mouth every 7 (seven) days. 11/06/22   Mechele Claude, MD    Allergies  Allergen Reactions   Keflex [Cephalexin] Itching and Rash   Percocet [Oxycodone-Acetaminophen] Itching and Rash    Pt can take oxycodone and tylenol separately    Percodan [Oxycodone-Aspirin] Itching  and Rash    Pt can take oxycodone and aspirin separately     Social History   Tobacco Use   Smoking status: Former    Current packs/day: 0.00    Types: Cigarettes    Quit date: 10/28/2008    Years since quitting: 14.5   Smokeless tobacco: Never  Vaping Use   Vaping status: Never Used  Substance Use Topics   Alcohol use: Not Currently    Comment: In the past   Drug use: No    Family History  Problem Relation Age of Onset   Rheumatic fever Mother    Heart disease Mother    Osteoporosis Mother    Alcohol abuse Father    Cancer Brother        liver   Osteoporosis Brother    Heart disease Brother    Breast cancer Neg Hx     Review of Systems  Constitutional: negative  for fevers and chills All other systems reviewed and are negative    Constitutional: in no apparent distress There were no vitals filed for this visit. EYES: anicteric Respiratory: normal respiratory effort Musculoskeletal: left hand wrapped  Labs: Lab Results  Component Value Date   WBC 7.5 05/02/2023   HGB 13.8 05/02/2023   HCT 42.1 05/02/2023   MCV 94.2 05/02/2023   PLT 228 05/02/2023    Lab Results  Component Value Date   CREATININE 0.50 (L) 05/06/2023   BUN 15 05/06/2023   NA 143 05/06/2023   K 4.9 05/06/2023   CL 103 05/06/2023   CO2 27 05/06/2023    Lab Results  Component Value Date   ALT 15 05/06/2023   AST 15 05/06/2023   ALKPHOS 57 05/06/2023   BILITOT 0.3 05/06/2023     Assessment: left thumb osteomyelitis.  MRI report reviewed and reported to have MRSA in her culture from urgent care.  Will get records of that and target MRSA.  Optimal treatment will be dalbavancin infusion x 2.  She will continue with doxycycline until that is started. She will stop cipro.   Plan: 1)  dalbavancin 1500 mg IV x 1 and repeat dose in 8 days, pending insurance approval.  2) ESR and CRP today  3) doxycycline until she receives the first dose 4) stop cipro now Follow up in 2 weeks

## 2023-05-10 ENCOUNTER — Ambulatory Visit: Payer: Managed Care, Other (non HMO) | Attending: Cardiology | Admitting: Internal Medicine

## 2023-05-10 ENCOUNTER — Encounter: Payer: Managed Care, Other (non HMO) | Attending: Internal Medicine | Admitting: Emergency Medicine

## 2023-05-10 VITALS — BP 137/96 | Temp 98.0°F | Resp 18

## 2023-05-10 DIAGNOSIS — M869 Osteomyelitis, unspecified: Secondary | ICD-10-CM | POA: Diagnosis not present

## 2023-05-10 MED ORDER — DEXTROSE 5 % IV SOLN
1500.0000 mg | Freq: Once | INTRAVENOUS | Status: AC
  Administered 2023-05-10: 1500 mg via INTRAVENOUS
  Filled 2023-05-10: qty 75

## 2023-05-10 NOTE — Progress Notes (Deleted)
Cardiology Office Note:    Date:  05/10/2023   ID:  Makayla Gibson, DOB 08-05-1961, MRN [id number]  PCP:  Mechele Claude, MD   [facility] Providers Cardiologist:  None { Click to update primary MD,subspecialty MD or APP then REFRESH:1}    Referring MD: Mechele Claude, MD   No chief complaint on file. Bradycardia  History of Present Illness:    Makayla Gibson is a 62 y.o. female with a hx of OSA, hypothyroidism,  referral for bradycardia, HR dropped to 20 during a surgical debridement of her thumb. She had a cardiac monitor with a 4 second pause in the early AM. Afib and a RBBB. EKG in June showed sinus bradycardia HR 41 bpm in June. On recent exam in July she was noted to have leg edema.  The 10-year ASCVD risk score (Arnett DK, et al., 2019) is: 2.1%   Values used to calculate the score:     Age: 89 years     Sex: Female     Is Non-Hispanic African American: No     Diabetic: No     Tobacco smoker: No     Systolic Blood Pressure: 106 mmHg     Is BP treated: No     HDL Cholesterol: 62 mg/dL     Total Cholesterol: 170 mg/dL  Past Medical History:  Diagnosis Date   Abscess of breast [id number]   Anemia    Arthritis    Heart murmur    as a child   Hypothyroidism    Osteoporosis    Sleep apnea    Thyroid disease 11/2014    Past Surgical History:  Procedure Laterality Date   APPENDECTOMY     BREAST CYST EXCISION Right 09/06/2016   BREAST LUMPECTOMY Left 01/29/2018   Procedure: LEFT BREAST LUMPECTOMY;  Surgeon: Griselda Miner, MD;  Location[facility];  Service: General;  Laterality: Left;   BREAST REDUCTION SURGERY Left 01/29/2018   Procedure: MAMMARY REDUCTION  (BREAST) FOR SYMETRY;  Surgeon: Peggye Form, DO;  Location: [facility] OR;  Service: Plastics;  Laterality: Left;   CESAREAN SECTION     4   DEBRIDEMENT AND CLOSURE WOUND Left 01/29/2018   Procedure: DEBRIDEMENT AND CLOSURE WOUND;  Surgeon: Peggye Form, DO;  Location: [facility] OR;  Service:  Plastics;  Laterality: Left;   I & D EXTREMITY Left 03/29/2023   Procedure: IRRIGATION AND DEBRIDEMENT OF THUMB, CURRETAGE OF LEFT THUMB PROXIMAL PHALANX, AND PLACEMENT OF CEMENT ANTIBIOTIC SPACER;  Surgeon: Marlyne Beards, MD;  Location: [facility] OR;  Service: Orthopedics;  Laterality: Left;   I & D EXTREMITY Left 05/02/2023   Procedure: IRRIGATION AND DEBRIDEMENT OF LEFT THUMB WITH PLACEMENT OF ANTIBIOTIC CEMENT;  Surgeon: Marlyne Beards, MD;  Location: [facility] OR;  Service: Orthopedics;  Laterality: Left;  Regional + MAC or general  60 min   INCISION AND DRAINAGE ABSCESS Right 07/29/2015   Procedure: INCISION AND DRAINAGE BREAST ABSCESS;  Surgeon: Franky Macho, MD;  Location: [facility] ORS;  Service: General;  Laterality: Right;   MASTECTOMY, PARTIAL Right 09/06/2016   Procedure: EXCISION OF LARGE RIGHT BREAST CHRONIC ABSCESS CAVITY;  Surgeon: Chevis Pretty III, MD;  Location: [facility] OR;  Service: General;  Laterality: Right;    Current Medications: No outpatient medications have been marked as taking for the 05/10/23 encounter (Appointment) with Maisie Fus, MD.     Allergies:   Keflex [cephalexin], Percocet [oxycodone-acetaminophen], and Percodan [oxycodone-aspirin]   Social History   Socioeconomic History  Marital status: Married    Spouse name: Not on file   Number of children: Not on file   Years of education: Not on file   Highest education level: Not on file  Occupational History   Not on file  Tobacco Use   Smoking status: Former    Current packs/day: 0.00    Types: Cigarettes    Quit date: 10/28/2008    Years since quitting: 14.5   Smokeless tobacco: Never  Vaping Use   Vaping status: Never Used  Substance and Sexual Activity   Alcohol use: Not Currently    Comment: In the past   Drug use: No   Sexual activity: Never  Other Topics Concern   Not on file  Social History Narrative   Not on file   Social Determinants of Health   Financial Resource Strain: Not on file  Food  Insecurity: Not on file  Transportation Needs: Not on file  Physical Activity: Not on file  Stress: Not on file  Social Connections: Unknown (02/12/2022)   Received from [employer]   Social Network    Social Network: Not on file     Family History: The patient's ***family history includes Alcohol abuse in her father; Cancer in her brother; Heart disease in her brother and mother; Osteoporosis in her brother and mother; Rheumatic fever in her mother. There is no history of Breast cancer.  ROS:   Please see the history of present illness.    *** All other systems reviewed and are negative.  EKGs/Labs/Other Studies Reviewed:    Cardiac Monitor 04/15/2023-05/06/2023 Patient had a min HR of 26 bpm, max HR of 181 bpm, and avg HR of 67 bpm. Predominant underlying rhythm was Sinus Rhythm. Bundle  Block/IVCD was present. Atrial Fibrillation occurred (<1% burden), ranging from 100-181 bpm (avg of 138 bpm), the  longest lasting 28 mins 0 secs with an avg rate of 124 bpm. 3 Pauses occurred, the longest lasting 4.1 secs (15 bpm). Idioventricular Rhythm was present. Isolated SVEs were occasional (3.6%, 43378), SVE Couplets were rare (<1.0%, 2821), and SVE Triplets  were rare (<1.0%, 732). Isolated VEs were rare (<1.0%, 20), VE Couplets were rare (<1.0%, 11), and VE Triplets were rare (<1.0%, 4). Inverted QRS complexes possibly due to inverted placement of device.        Recent Labs: 04/15/2023: TSH 2.170 05/02/2023: Hemoglobin 13.8; Platelets 228 05/06/2023: ALT 15; BUN 15; Creatinine, Ser 0.50; Potassium 4.9; Sodium 143   Recent Lipid Panel    Component Value Date/Time   CHOL 170 05/06/2023 1652   TRIG 157 (H) 05/06/2023 1652   HDL 62 05/06/2023 1652   CHOLHDL 2.7 05/06/2023 1652   LDLCALC 81 05/06/2023 1652     Risk Assessment/Calculations:   {Does this patient have ATRIAL FIBRILLATION?:[phone]}  No BP recorded.  {Refresh Note OR Click here to enter BP  :1}***          Physical Exam:    VS:  There were no vitals taken for this visit.    Wt Readings from Last 3 Encounters:  05/07/23 219 lb (99.3 kg)  05/06/23 219 lb (99.3 kg)  05/02/23 207 lb (93.9 kg)     GEN: *** Well nourished, well developed in no acute distress HEENT: Normal NECK: No JVD; No carotid bruits LYMPHATICS: No lymphadenopathy CARDIAC: ***RRR, no murmurs, rubs, gallops RESPIRATORY:  Clear to auscultation without rales, wheezing or rhonchi  ABDOMEN: Soft, non-tender, non-distended MUSCULOSKELETAL:  No edema; No deformity  SKIN: Warm  and dry NEUROLOGIC:  Alert and oriented x 3 PSYCHIATRIC:  Normal affect   ASSESSMENT:    Sinus bradycardia/Pauses - she is able to augment her heart rate and pauses during sleep. Will monitor for now, on indication for PPM at this time - she is asymptomatic   Paroxysmal Atrial Fibrillation -TSH is nl -  HTN - not well controlled   PLAN:    In order of problems listed above:  TTE BNP, A1c Eliquis *** Start spironolactone 50 mg daily, losartan 25 mg daily Avoid AVN blocking agents BMET 2 weeks Follow up with an APP in 3 months and me in 6 months      {Are you ordering a CV Procedure (e.g. stress test, cath, DCCV, TEE, etc)?   Press F2        :[id number]}    Medication Adjustments/Labs and Tests Ordered: Current medicines are reviewed at length with the patient today.  Concerns regarding medicines are outlined above.  No orders of the defined types were placed in this encounter.  No orders of the defined types were placed in this encounter.   There are no Patient Instructions on file for this visit.   Signed, Maisie Fus, MD  05/10/2023 7:40 AM    [facility]

## 2023-05-10 NOTE — Progress Notes (Signed)
Diagnosis: osteomyelitis of finger  Provider:   Molly Maduro comer  Procedure: IV Infusion  IV Type: Peripheral, IV Location: R Antecubital  Dalvance (Dalbavancin), Dose: 1500 mg  Infusion Start Time: 1439+  Infusion Stop Time: 1518  Post Infusion IV Care: Observation period completed and Peripheral IV Discontinued  Discharge: Condition: Good, Destination: Home . AVS Provided  Performed by:  Arrie Senate, RN

## 2023-05-16 ENCOUNTER — Other Ambulatory Visit: Payer: Self-pay | Admitting: Family Medicine

## 2023-05-17 ENCOUNTER — Other Ambulatory Visit: Payer: Self-pay | Admitting: Family Medicine

## 2023-05-17 DIAGNOSIS — E782 Mixed hyperlipidemia: Secondary | ICD-10-CM

## 2023-05-17 DIAGNOSIS — E038 Other specified hypothyroidism: Secondary | ICD-10-CM

## 2023-05-20 ENCOUNTER — Encounter: Payer: Managed Care, Other (non HMO) | Admitting: Emergency Medicine

## 2023-05-20 VITALS — BP 141/82 | HR 52 | Temp 97.9°F | Resp 18

## 2023-05-20 DIAGNOSIS — M869 Osteomyelitis, unspecified: Secondary | ICD-10-CM | POA: Diagnosis not present

## 2023-05-20 MED ORDER — DEXTROSE 5 % IV SOLN
1500.0000 mg | Freq: Once | INTRAVENOUS | Status: AC
  Administered 2023-05-20: 1500 mg via INTRAVENOUS
  Filled 2023-05-20: qty 75

## 2023-05-20 NOTE — Progress Notes (Signed)
Diagnosis:  Osteomyelitis of finger of left hand     Provider:   Staci Righter   Procedure: IV Infusion  IV Type: Peripheral, IV Location: R Hand  Dalvance (Dalbavancin), Dose: 1500 mg  Infusion Start Time: 1407  Infusion Stop Time: 1438  Post Infusion IV Care: Peripheral IV Discontinued  Discharge: Condition: Good, Destination: Home . AVS Provided  Performed by:  Arrie Senate, RN

## 2023-05-22 ENCOUNTER — Other Ambulatory Visit: Payer: Self-pay

## 2023-05-22 ENCOUNTER — Ambulatory Visit: Payer: Managed Care, Other (non HMO) | Admitting: Internal Medicine

## 2023-05-22 ENCOUNTER — Encounter: Payer: Self-pay | Admitting: Internal Medicine

## 2023-05-22 VITALS — BP 137/84 | HR 48 | Resp 16 | Ht 63.0 in | Wt 214.3 lb

## 2023-05-22 DIAGNOSIS — M869 Osteomyelitis, unspecified: Secondary | ICD-10-CM | POA: Diagnosis not present

## 2023-05-22 NOTE — Progress Notes (Signed)
   Subjective:    Patient ID: Makayla Gibson, female    DOB: 05-Sep-1961, 62 y.o.   MRN: [id number]  HPI Makayla Gibson is here for follow up of osteomyelitis of her left thumb.  She had developed an abscess s/p I and D with culture growth of MRSA and had been on doxycycline and cipro.  She is followed by Dr. Frazier Butt and is s/p debridement initially on 03/29/23 then followed by further debridement 05/02/23.  Noted destruction of distal bone.  I saw her initially on 05/07/23 and had her started on dalbavancin x 2 doses, which she received 8/9 and 8/19.  ESR 6 and CRP 3.5, both wnl.  Culture results received from [facility] in [facility] c/w MRSA, sensitive to tetracycline and TMP/SMX.   Hand in a cast.  She reports it is doing very well now and has closed up.  Some nausea with the infusions but better now.     Review of Systems  Constitutional:  Positive for fatigue. Negative for chills and fever.  Gastrointestinal:  Negative for diarrhea and nausea.  Skin:  Negative for rash.       Objective:   Physical Exam Musculoskeletal:     Comments: Left arm in cast  Neurological:     Mental Status: She is alert.           Assessment & Plan:

## 2023-05-22 NOTE — Assessment & Plan Note (Signed)
Clinically it seems to be healing now as expected with treatment with dalbavancin.  Unable to examine with cast but good report.  Discussed typical dosing of dalbavancin x 2, which she has received.   No further antibiotics indicated. She will continue to follow with Dr. Frazier Butt and can return here as needed.   I have personally spent 22 minutes involved in face-to-face and non-face-to-face activities for this patient on the day of the visit. Professional time spent includes the following activities: Preparing to see the patient (review of tests), Obtaining and/or reviewing separately obtained history (admission/discharge record), Performing a medically appropriate examination and/or evaluation , Ordering medications/tests/procedures, referring and communicating with other health care professionals, Documenting clinical information in the EMR, Independently interpreting results (not separately reported), Communicating results to the patient/family/caregiver, Counseling and educating the patient/family/caregiver and Care coordination (not separately reported).

## 2023-05-24 ENCOUNTER — Telehealth: Payer: Self-pay | Admitting: Family Medicine

## 2023-05-24 NOTE — Telephone Encounter (Signed)
Called and spoke with patient states she can only get in with heart doctor anywhere the first available anyone had was 08/21/23. On wait list patient wanted you to know

## 2023-07-11 ENCOUNTER — Other Ambulatory Visit: Payer: Self-pay | Admitting: Family Medicine

## 2023-07-24 ENCOUNTER — Ambulatory Visit: Payer: Managed Care, Other (non HMO) | Admitting: Cardiology

## 2023-08-19 NOTE — Progress Notes (Unsigned)
Cardiology Office Note   Date:  08/21/2023   ID:  SATIVA DRILLING, DOB 12-22-1960, MRN [id number]  PCP:  Mechele Claude, MD  Cardiologist:   Rollene Rotunda, MD Referring:  Mechele Claude, MD  Chief Complaint  Patient presents with   Bradycardia      History of Present Illness: Makayla Gibson is a 62 y.o. female who was referred by Mechele Claude, MD for evaluation of bradycardia and leg swelling.  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  She says she does well.  She has no complaints.  She was noted to have a heart rate in the 40s.  However, this was during surgery when she was having debridement of osteomyelitis of her thumb.  She said she is always at heart rate in the 50s and 60s and she has not had any symptoms related to this.  The patient denies any new symptoms such as chest discomfort, neck or arm discomfort. There has been no new shortness of breath, PND or orthopnea. There have been no reported palpitations, presyncope or syncope.  She has not had any prior cardiac workup.  I do note that at least as far back as 2016 she had right bundle branch block on EKG.   Past Medical History:  Diagnosis Date   Abscess of breast 1202017   Anemia    Arthritis    Hypothyroidism    Osteoporosis    Sleep apnea    CPAP   Thyroid disease 11/2014    Past Surgical History:  Procedure Laterality Date   APPENDECTOMY     BREAST CYST EXCISION Right 09/06/2016   BREAST LUMPECTOMY Left 01/29/2018   Procedure: LEFT BREAST LUMPECTOMY;  Surgeon: Griselda Miner, MD;  Location[facility];  Service: General;  Laterality: Left;   BREAST REDUCTION SURGERY Left 01/29/2018   Procedure: MAMMARY REDUCTION  (BREAST) FOR SYMETRY;  Surgeon: Peggye Form, DO;  Location: [facility] OR;  Service: Plastics;  Laterality: Left;   CESAREAN SECTION     4   DEBRIDEMENT AND CLOSURE WOUND Left 01/29/2018   Procedure: DEBRIDEMENT AND  CLOSURE WOUND;  Surgeon: Peggye Form, DO;  Location: [facility] OR;  Service: Plastics;  Laterality: Left;   I & D EXTREMITY Left 03/29/2023   Procedure: IRRIGATION AND DEBRIDEMENT OF THUMB, CURRETAGE OF LEFT THUMB PROXIMAL PHALANX, AND PLACEMENT OF CEMENT ANTIBIOTIC SPACER;  Surgeon: Marlyne Beards, MD;  Location: [facility] OR;  Service: Orthopedics;  Laterality: Left;   I & D EXTREMITY Left 05/02/2023   Procedure: IRRIGATION AND DEBRIDEMENT OF LEFT THUMB WITH PLACEMENT OF ANTIBIOTIC CEMENT;  Surgeon: Marlyne Beards, MD;  Location: [facility] OR;  Service: Orthopedics;  Laterality: Left;  Regional + MAC or general  60 min   INCISION AND DRAINAGE ABSCESS Right 07/29/2015   Procedure: INCISION AND DRAINAGE BREAST ABSCESS;  Surgeon: Franky Macho, MD;  Location: [facility] ORS;  Service: General;  Laterality: Right;   MASTECTOMY, PARTIAL Right 09/06/2016   Procedure: EXCISION OF LARGE RIGHT BREAST CHRONIC ABSCESS CAVITY;  Surgeon: Chevis Pretty III, MD;  Location: [facility] OR;  Service: General;  Laterality: Right;     Current Outpatient Medications  Medication Sig Dispense Refill   acetaminophen (TYLENOL) 500 MG tablet Take 1,000 mg by mouth daily as needed for headache.     alendronate (FOSAMAX) 70 MG tablet TAKE BY MOUTH ONCE A WEEK WITH A FULL GLASS OF WATER ON AN  EMPTY STOMACH. 12 tablet 1   atorvastatin (LIPITOR) 40 MG tablet TAKE 1 TABLET (40 MG TOTAL) BY MOUTH DAILY. TAKE FOR CHOLESTEROL CONTROL 90 tablet 3   furosemide (LASIX) 40 MG tablet Take 1 tablet (40 mg total) by mouth daily. 90 tablet 3   levothyroxine (SYNTHROID) 125 MCG tablet TAKE 1 TABLET BY MOUTH EVERY DAY 90 tablet 3   meloxicam (MOBIC) 15 MG tablet TAKE 1 TABLET (15 MG TOTAL) BY MOUTH DAILY WITH SUPPER 30 tablet 2   Multiple Vitamins-Minerals (ADULT GUMMY PO) Take 2 capsules by mouth daily.     Naphazoline-Glycerin (CLEAR EYES MAX REDNESS RELIEF) 0.03-0.5 % SOLN Place 1 drop into both eyes 2 (two) times daily as needed (redness).     potassium chloride  (MICRO-K) 10 MEQ CR capsule Take 1 capsule (10 mEq total) by mouth daily. TAKE 1 CAPSULE BY MOUTH EVERY DAY 90 capsule 3   rOPINIRole (REQUIP) 1 MG tablet TAKE 1 TABLET (1 MG TOTAL) BY MOUTH AT BEDTIME. FOR LEG CRAMPS 90 tablet 1   Vitamin D, Ergocalciferol, (DRISDOL) 1.25 MG (50000 UNIT) CAPS capsule Take 1 capsule (50,000 Units total) by mouth every 7 (seven) days. 13 capsule 3   No current facility-administered medications for this visit.    Allergies:   Keflex [cephalexin], Percocet [oxycodone-acetaminophen], and Percodan [oxycodone-aspirin]    Social History:  The patient  reports that she quit smoking about 14 years ago. Her smoking use included cigarettes. She has never been exposed to tobacco smoke. She has never used smokeless tobacco. She reports that she does not currently use alcohol. She reports that she does not use drugs.   Family History:  The patient's family history includes Alcohol abuse in her father; Cancer in her brother; Heart disease in her mother; Heart disease (age of onset: 20) in her brother; Osteoporosis in her brother and mother; Rheumatic fever in her mother.    ROS:  Please see the history of present illness.   Otherwise, review of systems are positive for none.   All other systems are reviewed and negative.    PHYSICAL EXAM: VS:  BP 110/80   Pulse (!) 56   Ht 5\' 3"  (1.6 m)   Wt 216 lb (98 kg)   BMI 38.26 kg/m  , BMI Body mass index is 38.26 kg/m. GENERAL:  Well appearing HEENT:  Pupils equal round and reactive, fundi not visualized, oral mucosa unremarkable NECK:  No jugular venous distention, waveform within normal limits, carotid upstroke brisk and symmetric, no bruits, no thyromegaly LYMPHATICS:  No cervical, inguinal adenopathy LUNGS:  Clear to auscultation bilaterally BACK:  No CVA tenderness CHEST:  Unremarkable HEART:  PMI not displaced or sustained,S1 and S2 within normal limits, no S3, no S4, no clicks, no rubs, no murmurs ABD:  Flat,  positive bowel sounds normal in frequency in pitch, no bruits, no rebound, no guarding, no midline pulsatile mass, no hepatomegaly, no splenomegaly EXT:  2 plus pulses throughout, no edema, no cyanosis no clubbing SKIN:  No rashes no nodules NEURO:  Cranial nerves II through XII grossly intact, motor grossly intact throughout [facility]:  Cognitively intact, oriented to person place and time    EKG:  EKG Interpretation Date/Time:  Wednesday August 21 2023 15:17:44 EST Ventricular Rate:  53 PR Interval:  120 QRS Duration:  120 QT Interval:  448 QTC Calculation: 420 R Axis:   -28  Text Interpretation: Sinus bradycardia with Premature supraventricular complexes Right bundle branch block When compared with ECG of  29-Mar-2023 14:54, Premature supraventricular complexes now present. RBBB is old. Confirmed by Rollene Rotunda ([id number]) on 08/21/2023 3:30:01 PM     Recent Labs: 04/15/2023: TSH 2.170 05/02/2023: Hemoglobin 13.8; Platelets 228 05/06/2023: ALT 15; BUN 15; Creatinine, Ser 0.50; Potassium 4.9; Sodium 143    Lipid Panel    Component Value Date/Time   CHOL 170 05/06/2023 1652   TRIG 157 (H) 05/06/2023 1652   HDL 62 05/06/2023 1652   CHOLHDL 2.7 05/06/2023 1652   LDLCALC 81 05/06/2023 1652      Wt Readings from Last 3 Encounters:  08/21/23 216 lb (98 kg)  05/22/23 214 lb 4.8 oz (97.2 kg)  05/07/23 219 lb (99.3 kg)      Other studies Reviewed: Additional studies/ records that were reviewed today include: Hospital records. Review of the above records demonstrates:  Please see elsewhere in the note.     ASSESSMENT AND PLAN:  Bradycardia: She does have bradycardia with right bundle branch block but she has no symptoms related to this.  We a long conversation about this.  No change in therapy is indicated.  Of note she is up-to-date with blood work including her thyroid.  Leg swelling: She has had some chronic mild leg swelling.  It is nonpitting today.  She takes her Lasix as  needed.  No further workup is indicated.  Current medicines are reviewed at length with the patient today.  The patient does not have concerns regarding medicines.  The following changes have been made:  no change  Labs/ tests ordered today include:   Orders Placed This Encounter  Procedures   EKG 12-Lead     Disposition:   FU with me as needed.   Signed, Rollene Rotunda, MD  08/21/2023 3:32 PM     [facility]

## 2023-08-21 ENCOUNTER — Encounter: Payer: Self-pay | Admitting: Cardiology

## 2023-08-21 ENCOUNTER — Ambulatory Visit (INDEPENDENT_AMBULATORY_CARE_PROVIDER_SITE_OTHER): Payer: Managed Care, Other (non HMO) | Admitting: Cardiology

## 2023-08-21 VITALS — BP 110/80 | HR 56 | Ht 63.0 in | Wt 216.0 lb

## 2023-08-21 DIAGNOSIS — M7989 Other specified soft tissue disorders: Secondary | ICD-10-CM | POA: Diagnosis not present

## 2023-08-21 DIAGNOSIS — R001 Bradycardia, unspecified: Secondary | ICD-10-CM | POA: Diagnosis not present

## 2023-08-21 NOTE — Patient Instructions (Signed)
Medication Instructions:  The current medical regimen is effective;  continue present plan and medications.  *If you need a refill on your cardiac medications before your next appointment, please call your pharmacy*   Follow-Up: At [facility], you and your health needs are our priority.  As part of our continuing mission to provide you with exceptional heart care, we have created designated Provider Care Teams.  These Care Teams include your primary Cardiologist (physician) and Advanced Practice Providers (APPs -  Physician Assistants and Nurse Practitioners) who all work together to provide you with the care you need, when you need it.  We recommend signing up for the patient portal called "MyChart".  Sign up information is provided on this After Visit Summary.  MyChart is used to connect with patients for Virtual Visits (Telemedicine).  Patients are able to view lab/test results, encounter notes, upcoming appointments, etc.  Non-urgent messages can be sent to your provider as well.   To learn more about what you can do with MyChart, go to [email or web address].    Your next appointment:   Follow up as needed  Right Bundle Branch Block

## 2023-10-31 ENCOUNTER — Telehealth: Payer: Self-pay | Admitting: Family Medicine

## 2023-10-31 NOTE — Telephone Encounter (Signed)
FYI  Copied from CRM [id number]. Topic: General - Other >> Oct 31, 2023  9:07 AM Fuller Mandril wrote: Reason for CRM: Patient has been having trouble with landline and not able to access messages. Would like text to mobile phone [phone] instead of voice mail on land line. Made mobile primary. Thank You

## 2023-11-06 ENCOUNTER — Ambulatory Visit: Payer: Managed Care, Other (non HMO) | Admitting: Family Medicine

## 2023-11-07 ENCOUNTER — Ambulatory Visit: Payer: Self-pay | Admitting: Family Medicine

## 2023-11-07 NOTE — Telephone Encounter (Signed)
 Copied from CRM [id number]. Topic: Clinical - Red Word Triage >> Nov 07, 2023  2:33 PM Laurier C wrote: Red Word that prompted transfer to Nurse Triage: Patient has been showing signs of the flu: body aches, chills, cough and congestions with mild fever.   Chief Complaint: Cough, SOB Symptoms: fever, body aches Frequency: constant Pertinent Negatives: Patient denies cardiac or lung disease Disposition: [x] ED /[] Urgent Care (no appt availability in office) / [] Appointment(In office/virtual)/ []  [place] [facility]/ [] Home Care/ [] Refused Recommended Disposition /[] [place] [facility]/ []  Follow-up with PCP Additional Notes: Patient reports cough, fever, and body aches since early yesterday. Reports shortness of breath at rest and sharp chest pains when coughing. RN advsing ED. Pt is agreeable.   Reason for Disposition  [1] MODERATE difficulty breathing (e.g., speaks in phrases, SOB even at rest, pulse 100-120) AND [2] still present when not coughing  Answer Assessment - Initial Assessment Questions 1. ONSET: When did the cough begin?      Started yesterday  2. SEVERITY: How bad is the cough today?      Getting worse,  3. SPUTUM: Describe the color of your sputum (none, dry cough; clear, white, yellow, green)     Yellowish-green  4. HEMOPTYSIS: Are you coughing up any blood? If so ask: How much? (flecks, streaks, tablespoons, etc.)     No  5. DIFFICULTY BREATHING: Are you having difficulty breathing? If Yes, ask: How bad is it? (e.g., mild, moderate, severe)    - MILD: No SOB at rest, mild SOB with walking, speaks normally in sentences, can lie down, no retractions, pulse < 100.    - MODERATE: SOB at rest, SOB with minimal exertion and prefers to sit, cannot lie down flat, speaks in phrases, mild retractions, audible wheezing, pulse 100-120.    - SEVERE: Very SOB at rest, speaks in single words, struggling to breathe, sitting hunched forward, retractions, pulse > 120    Moderate shortness of breath, sharp chest pain when coughing  6. FEVER: Do you have a fever? If Yes, ask: What is your temperature, how was it measured, and when did it start?     Started running a fever today. 99.58F then 100.69F  7. CARDIAC HISTORY: Do you have any history of heart disease? (e.g., heart attack, congestive heart failure)      No cardiac history  8. LUNG HISTORY: Do you have any history of lung disease?  (e.g., pulmonary embolus, asthma, emphysema)     No lung disease  9. PE RISK FACTORS: Do you have a history of blood clots? (or: recent major surgery, recent prolonged travel, bedridden)     No  10. OTHER SYMPTOMS: Do you have any other symptoms? (e.g., runny nose, wheezing, chest pain)       Sneezing, chest congestion, body aches  11. PREGNANCY: Is there any chance you are pregnant? When was your last menstrual period?       No  12. TRAVEL: Have you traveled out of the country in the last month? (e.g., travel history, exposures)       No  Protocols used: Cough - Acute Productive-A-AH

## 2023-11-08 ENCOUNTER — Other Ambulatory Visit: Payer: Self-pay | Admitting: Family Medicine

## 2023-11-20 ENCOUNTER — Other Ambulatory Visit: Payer: Self-pay | Admitting: Family Medicine

## 2023-12-10 ENCOUNTER — Other Ambulatory Visit: Payer: Self-pay | Admitting: Family Medicine

## 2023-12-11 ENCOUNTER — Ambulatory Visit: Payer: Managed Care, Other (non HMO) | Admitting: Family Medicine

## 2023-12-11 ENCOUNTER — Encounter: Payer: Self-pay | Admitting: Family Medicine

## 2023-12-11 VITALS — BP 122/75 | HR 50 | Temp 97.4°F | Ht 63.0 in | Wt 217.0 lb

## 2023-12-11 DIAGNOSIS — D509 Iron deficiency anemia, unspecified: Secondary | ICD-10-CM | POA: Diagnosis not present

## 2023-12-11 DIAGNOSIS — G2581 Restless legs syndrome: Secondary | ICD-10-CM

## 2023-12-11 DIAGNOSIS — Z124 Encounter for screening for malignant neoplasm of cervix: Secondary | ICD-10-CM

## 2023-12-11 DIAGNOSIS — E038 Other specified hypothyroidism: Secondary | ICD-10-CM

## 2023-12-11 DIAGNOSIS — E782 Mixed hyperlipidemia: Secondary | ICD-10-CM

## 2023-12-11 DIAGNOSIS — I4891 Unspecified atrial fibrillation: Secondary | ICD-10-CM | POA: Diagnosis not present

## 2023-12-11 LAB — LIPID PANEL

## 2023-12-11 NOTE — Progress Notes (Signed)
 Subjective:  Patient ID: Makayla Gibson, female    DOB: 1961-04-12  Age: 63 y.o. MRN: [id number]  CC: Medical Management of Chronic Issues and Nasal Congestion (Still having congestion. Had the flu 3 weeks ago. Getting better. )   HPI ROYETTA PROBUS presents for  follow-up on  thyroid. The patient has a history of hypothyroidism for many years. It has been stable recently. Pt. denies any change in  voice, loss of hair, heat or cold intolerance. Energy level has been adequate to good. Patient denies constipation and diarrhea. No myxedema. Medication is as noted below. Verified that pt is taking it daily on an empty stomach. Well tolerated.   in for follow-up of elevated cholesterol. Doing well without complaints on current medication. Denies side effects of statin including myalgia and arthralgia and nausea. Currently no chest pain, shortness of breath or other cardiovascular related symptoms noted.  Leg cramps well controlled with ropinirole.      12/11/2023    3:40 PM 05/22/2023    9:45 AM 05/06/2023    3:53 PM  Depression screen PHQ 2/9  Decreased Interest 0 0 0  Down, Depressed, Hopeless 0 0 1  PHQ - 2 Score 0 0 1  Altered sleeping 0  0  Tired, decreased energy 1  0  Change in appetite 0  0  Feeling bad or failure about yourself  0  0  Trouble concentrating 0  0  Moving slowly or fidgety/restless 0  0  Suicidal thoughts 0  0  PHQ-9 Score 1  1  Difficult doing work/chores Not difficult at all  Not difficult at all    History Quetzal has a past medical history of Abscess of breast (5284132), Anemia, Arthritis, Hypothyroidism, Osteoporosis, Sleep apnea, and Thyroid disease (11/2014).   She has a past surgical history that includes Cesarean section; Appendectomy; Incision and drainage abscess (Right, 07/29/2015); Breast cyst excision (Right, 09/06/2016); Mastectomy, partial (Right, 09/06/2016); Breast lumpectomy (Left, 01/29/2018); Debridement and closure wound (Left,  01/29/2018); Breast reduction surgery (Left, 01/29/2018); I & D extremity (Left, 03/29/2023); and I & D extremity (Left, 05/02/2023).   Her family history includes Alcohol abuse in her father; Cancer in her brother; Heart disease in her mother; Heart disease (age of onset: 74) in her brother; Osteoporosis in her brother and mother; Rheumatic fever in her mother.She reports that she quit smoking about 15 years ago. Her smoking use included cigarettes. She has never been exposed to tobacco smoke. She has never used smokeless tobacco. She reports that she does not currently use alcohol. She reports that she does not use drugs.    ROS Review of Systems  Constitutional: Negative.   HENT: Negative.    Eyes:  Negative for visual disturbance.  Respiratory:  Negative for shortness of breath.   Cardiovascular:  Negative for chest pain.  Gastrointestinal:  Negative for abdominal pain.  Musculoskeletal:  Negative for arthralgias.    Objective:  BP 122/75   Pulse (!) 50   Temp (!) 97.4 F (36.3 C)   Ht 5\' 3"  (1.6 m)   Wt 217 lb (98.4 kg)   SpO2 93%   BMI 38.44 kg/m   BP Readings from Last 3 Encounters:  12/11/23 122/75  08/21/23 110/80  05/22/23 137/84    Wt Readings from Last 3 Encounters:  12/11/23 217 lb (98.4 kg)  08/21/23 216 lb (98 kg)  05/22/23 214 lb 4.8 oz (97.2 kg)     Physical Exam Constitutional:  General: She is not in acute distress.    Appearance: She is well-developed.  HENT:     Head: Atraumatic.  Cardiovascular:     Rate and Rhythm: Normal rate and regular rhythm.  Pulmonary:     Breath sounds: Normal breath sounds.  Musculoskeletal:        General: Normal range of motion.  Skin:    General: Skin is warm and dry.  Neurological:     Mental Status: She is alert and oriented to person, place, and time.       Assessment & Plan:   Lashanna was seen today for medical management of chronic issues and nasal congestion.  Diagnoses and all orders for this  visit:  Mixed hyperlipidemia -     CBC with Differential/Platelet -     CMP14+EGFR -     Lipid panel -     TSH + free T4  Iron deficiency anemia, unspecified iron deficiency anemia type -     CBC with Differential/Platelet -     CMP14+EGFR -     Lipid panel -     TSH + free T4  Other specified hypothyroidism -     CBC with Differential/Platelet -     CMP14+EGFR -     Lipid panel -     TSH + free T4  Atrial fibrillation with RVR (HCC)  Cervical cancer screening  Restless leg syndrome       I am having [name]. [name] maintain her acetaminophen, furosemide, Multiple Vitamins-Minerals (ADULT GUMMY PO), Clear Eyes Max Redness Relief, potassium chloride, levothyroxine, atorvastatin, meloxicam, alendronate, Vitamin D (Ergocalciferol), and rOPINIRole.  Allergies as of 12/11/2023       Reactions   Keflex [cephalexin] Itching, Rash   Percocet [oxycodone-acetaminophen] Itching, Rash   Pt can take oxycodone and tylenol separately    Percodan [oxycodone-aspirin] Itching, Rash   Pt can take oxycodone and aspirin separately         Medication List        Accurate as of December 11, 2023 11:59 PM. If you have any questions, ask your nurse or doctor.          acetaminophen 500 MG tablet Commonly known as: TYLENOL Take 1,000 mg by mouth daily as needed for headache.   ADULT GUMMY PO Take 2 capsules by mouth daily.   alendronate 70 MG tablet Commonly known as: FOSAMAX TAKE 1 TABLET BY MOUTH ONCE A WEEK WITH A FULL GLASS OF WATER ON AN EMPTY STOMACH.   atorvastatin 40 MG tablet Commonly known as: LIPITOR TAKE 1 TABLET (40 MG TOTAL) BY MOUTH DAILY. TAKE FOR CHOLESTEROL CONTROL   Clear Eyes Max Redness Relief 0.03-0.5 % Soln Generic drug: Naphazoline-Glycerin Place 1 drop into both eyes 2 (two) times daily as needed (redness).   furosemide 40 MG tablet Commonly known as: LASIX Take 1 tablet (40 mg total) by mouth daily.   levothyroxine 125 MCG  tablet Commonly known as: SYNTHROID TAKE 1 TABLET BY MOUTH EVERY DAY   meloxicam 15 MG tablet Commonly known as: MOBIC TAKE 1 TABLET (15 MG TOTAL) BY MOUTH DAILY WITH SUPPER   potassium chloride 10 MEQ CR capsule Commonly known as: MICRO-K Take 1 capsule (10 mEq total) by mouth daily. TAKE 1 CAPSULE BY MOUTH EVERY DAY   rOPINIRole 1 MG tablet Commonly known as: REQUIP TAKE 1 TABLET (1 MG TOTAL) BY MOUTH AT BEDTIME. FOR LEG CRAMPS   Vitamin D (Ergocalciferol) 1.25 MG (50000 UNIT) Caps capsule Commonly  known as: DRISDOL TAKE 1 CAPSULE (50,000 UNITS TOTAL) BY MOUTH EVERY 7 (SEVEN) DAYS         Follow-up: Return in about 6 months (around 06/12/2024) for [facility] physical.  Mechele Claude, M.D.

## 2023-12-12 LAB — CBC WITH DIFFERENTIAL/PLATELET
Basophils Absolute: 0.1 10*3/uL (ref 0.0–0.2)
Basos: 1 %
EOS (ABSOLUTE): 0.2 10*3/uL (ref 0.0–0.4)
Eos: 2 %
Hematocrit: 42.2 % (ref 34.0–46.6)
Hemoglobin: 14.1 g/dL (ref 11.1–15.9)
Immature Grans (Abs): 0.1 10*3/uL (ref 0.0–0.1)
Immature Granulocytes: 1 %
Lymphocytes Absolute: 2.2 10*3/uL (ref 0.7–3.1)
Lymphs: 24 %
MCH: 30.7 pg (ref 26.6–33.0)
MCHC: 33.4 g/dL (ref 31.5–35.7)
MCV: 92 fL (ref 79–97)
Monocytes Absolute: 0.6 10*3/uL (ref 0.1–0.9)
Monocytes: 7 %
Neutrophils Absolute: 6 10*3/uL (ref 1.4–7.0)
Neutrophils: 65 %
Platelets: 215 10*3/uL (ref 150–450)
RBC: 4.59 x10E6/uL (ref 3.77–5.28)
RDW: 12.6 % (ref 11.7–15.4)
WBC: 9.1 10*3/uL (ref 3.4–10.8)

## 2023-12-12 LAB — CMP14+EGFR
ALT: 24 IU/L (ref 0–32)
AST: 17 IU/L (ref 0–40)
Albumin: 4 g/dL (ref 3.9–4.9)
Alkaline Phosphatase: 68 IU/L (ref 44–121)
BUN/Creatinine Ratio: 48 — ABNORMAL HIGH (ref 12–28)
BUN: 26 mg/dL (ref 8–27)
Bilirubin Total: 0.3 mg/dL (ref 0.0–1.2)
CO2: 25 mmol/L (ref 20–29)
Calcium: 9.8 mg/dL (ref 8.7–10.3)
Chloride: 105 mmol/L (ref 96–106)
Creatinine, Ser: 0.54 mg/dL — ABNORMAL LOW (ref 0.57–1.00)
Globulin, Total: 2.6 g/dL (ref 1.5–4.5)
Glucose: 98 mg/dL (ref 70–99)
Potassium: 4.5 mmol/L (ref 3.5–5.2)
Sodium: 144 mmol/L (ref 134–144)
Total Protein: 6.6 g/dL (ref 6.0–8.5)
eGFR: 104 mL/min/{1.73_m2} (ref >59)

## 2023-12-12 LAB — LIPID PANEL
Cholesterol, Total: 173 mg/dL (ref 100–199)
HDL: 51 mg/dL (ref >39)
LDL CALC COMMENT:: 3.4 ratio (ref 0.0–4.4)
LDL Chol Calc (NIH): 96 mg/dL (ref 0–99)
Triglycerides: 147 mg/dL (ref 0–149)
VLDL Cholesterol Cal: 26 mg/dL (ref 5–40)

## 2023-12-12 LAB — TSH+FREE T4
Free T4: 1.36 ng/dL (ref 0.82–1.77)
TSH: 3.97 u[IU]/mL (ref 0.450–4.500)

## 2023-12-14 DIAGNOSIS — G2581 Restless legs syndrome: Secondary | ICD-10-CM | POA: Insufficient documentation

## 2023-12-14 NOTE — Progress Notes (Signed)
Hello Jaline,  Your lab result is normal and/or stable.Some minor variations that are not significant are commonly marked abnormal, but do not represent any medical problem for you.  Best regards, Warren Stacks, M.D.

## 2023-12-20 ENCOUNTER — Other Ambulatory Visit: Payer: Self-pay | Admitting: Family Medicine

## 2024-01-28 ENCOUNTER — Other Ambulatory Visit: Payer: Self-pay | Admitting: Family Medicine

## 2024-02-16 ENCOUNTER — Other Ambulatory Visit: Payer: Self-pay | Admitting: Family Medicine

## 2024-02-26 ENCOUNTER — Other Ambulatory Visit: Payer: Self-pay | Admitting: Family Medicine

## 2024-03-17 ENCOUNTER — Other Ambulatory Visit: Payer: Self-pay | Admitting: Family Medicine

## 2024-04-23 ENCOUNTER — Other Ambulatory Visit: Payer: Self-pay | Admitting: Family Medicine

## 2024-04-23 DIAGNOSIS — R600 Localized edema: Secondary | ICD-10-CM

## 2024-05-23 ENCOUNTER — Other Ambulatory Visit: Payer: Self-pay | Admitting: Family Medicine

## 2024-05-23 DIAGNOSIS — E038 Other specified hypothyroidism: Secondary | ICD-10-CM

## 2024-05-29 ENCOUNTER — Other Ambulatory Visit: Payer: Self-pay | Admitting: Family Medicine

## 2024-05-29 DIAGNOSIS — E782 Mixed hyperlipidemia: Secondary | ICD-10-CM

## 2024-05-29 DIAGNOSIS — R600 Localized edema: Secondary | ICD-10-CM

## 2024-06-17 ENCOUNTER — Encounter: Payer: Self-pay | Admitting: Family Medicine

## 2024-06-17 ENCOUNTER — Ambulatory Visit (INDEPENDENT_AMBULATORY_CARE_PROVIDER_SITE_OTHER): Admitting: Family Medicine

## 2024-06-17 VITALS — BP 125/76 | HR 80 | Temp 98.3°F | Ht 63.0 in | Wt 224.0 lb

## 2024-06-17 DIAGNOSIS — E038 Other specified hypothyroidism: Secondary | ICD-10-CM

## 2024-06-17 DIAGNOSIS — L2084 Intrinsic (allergic) eczema: Secondary | ICD-10-CM

## 2024-06-17 DIAGNOSIS — E782 Mixed hyperlipidemia: Secondary | ICD-10-CM

## 2024-06-17 DIAGNOSIS — D509 Iron deficiency anemia, unspecified: Secondary | ICD-10-CM

## 2024-06-17 DIAGNOSIS — Z0000 Encounter for general adult medical examination without abnormal findings: Secondary | ICD-10-CM

## 2024-06-17 DIAGNOSIS — G4733 Obstructive sleep apnea (adult) (pediatric): Secondary | ICD-10-CM

## 2024-06-17 DIAGNOSIS — M810 Age-related osteoporosis without current pathological fracture: Secondary | ICD-10-CM

## 2024-06-17 DIAGNOSIS — Z0001 Encounter for general adult medical examination with abnormal findings: Secondary | ICD-10-CM

## 2024-06-17 DIAGNOSIS — E669 Obesity, unspecified: Secondary | ICD-10-CM | POA: Diagnosis not present

## 2024-06-17 DIAGNOSIS — G2581 Restless legs syndrome: Secondary | ICD-10-CM

## 2024-06-17 LAB — URINALYSIS
Bilirubin, UA: NEGATIVE
Glucose, UA: NEGATIVE
Ketones, UA: NEGATIVE
Leukocytes,UA: NEGATIVE
Nitrite, UA: NEGATIVE
Protein,UA: NEGATIVE
RBC, UA: NEGATIVE
Specific Gravity, UA: 1.02 (ref 1.005–1.030)
Urobilinogen, Ur: 1 mg/dL (ref 0.2–1.0)
pH, UA: 6 (ref 5.0–7.5)

## 2024-06-17 MED ORDER — FLUOCINOLONE ACETONIDE 0.01 % EX SOLN: CUTANEOUS | 5 refills | Status: AC

## 2024-06-17 NOTE — Progress Notes (Signed)
 Subjective:  Patient ID: Makayla Gibson, female    DOB: 10-24-1960  Age: 63 y.o. MRN: [id number]  CC: Annual Exam   HPI  Discussed the use of AI scribe software for clinical note transcription with the patient, who gave verbal consent to proceed.  History of Present Illness Makayla Gibson is a 63 year old female who presents for an annual physical exam.  Her blood pressure is typically low when relaxed, and she is not currently on antihypertensive medications.  She takes alendronate  weekly for osteoporosis prevention without experiencing nausea. Her next bone density test is scheduled for February. She also takes vitamin D  weekly and consumes milk with every meal for calcium  intake.  She is on atorvastatin  for cholesterol management, taken at night without issues. Her cholesterol levels are due to be checked today.  She uses furosemide  as needed for swelling, primarily on her days off work to avoid frequent urination. She avoids salt to manage swelling and takes potassium supplements only when using furosemide .  Her energy levels are good, and she is on thyroid  medication. Blood tests will be conducted to check thyroid  levels.  She takes ropinirole  nightly for restless leg syndrome, which has significantly reduced her leg cramps, though she occasionally experiences cramps in her feet, likely due to standing for long periods.  She uses a CPAP machine for sleep apnea and reports feeling energetic, indicating good compliance with the device.  She experiences dry, itchy ears due to eczema in the ear canal, with daily itching deep inside her ears.  She has a history of osteomyelitis in her thumb, and she is careful to avoid re-injury.  Her heart doctor has told her that the right side of her heart does not work as well as the left, and she has been instructed to call the heart nurse if she ever feels faint or lightheaded, but she has not experienced any fainting or  lightheadedness recently.          06/17/2024    2:54 PM 12/11/2023    3:40 PM 05/22/2023    9:45 AM  Depression screen PHQ 2/9  Decreased Interest 0 0 0  Down, Depressed, Hopeless 0 0 0  PHQ - 2 Score 0 0 0  Altered sleeping 0 0   Tired, decreased energy 0 1   Change in appetite 0 0   Feeling bad or failure about yourself  0 0   Trouble concentrating 0 0   Moving slowly or fidgety/restless 0 0   Suicidal thoughts 0 0   PHQ-9 Score 0 1   Difficult doing work/chores Not difficult at all Not difficult at all     History Makayla Gibson has a past medical history of Abscess of breast ([id number]), Anemia, Arthritis, Hypothyroidism, Osteoporosis, Sleep apnea, and Thyroid  disease (11/2014).   She has a past surgical history that includes Cesarean section; Appendectomy; Incision and drainage abscess (Right, 07/29/2015); Breast cyst excision (Right, 09/06/2016); Mastectomy, partial (Right, 09/06/2016); Breast lumpectomy (Left, 01/29/2018); Debridement and closure wound (Left, 01/29/2018); Breast reduction surgery (Left, 01/29/2018); I & D extremity (Left, 03/29/2023); and I & D extremity (Left, 05/02/2023).   Her family history includes Alcohol abuse in her father; Cancer in her brother; Heart disease in her mother; Heart disease (age of onset: 21) in her brother; Osteoporosis in her brother and mother; Rheumatic fever in her mother.She reports that she quit smoking about 15 years ago. Her smoking use included cigarettes. She has never been exposed to tobacco smoke. She  has never used smokeless tobacco. She reports that she does not currently use alcohol. She reports that she does not use drugs.    ROS Review of Systems  Constitutional:  Negative for appetite change, chills, diaphoresis, fatigue, fever and unexpected weight change.  HENT:  Negative for congestion, ear pain, hearing loss, postnasal drip, rhinorrhea, sneezing, sore throat and trouble swallowing.   Eyes:  Negative for pain.  Respiratory:   Negative for cough, chest tightness and shortness of breath.   Cardiovascular:  Negative for chest pain and palpitations.  Gastrointestinal:  Negative for abdominal pain, constipation, diarrhea, nausea and vomiting.  Endocrine: Negative for cold intolerance, heat intolerance, polydipsia, polyphagia and polyuria.  Genitourinary:  Negative for dysuria, frequency and menstrual problem.  Musculoskeletal:  Negative for arthralgias and joint swelling.  Skin:  Negative for rash.  Allergic/Immunologic: Negative for environmental allergies.  Neurological:  Negative for dizziness, weakness, numbness and headaches.  Psychiatric/Behavioral:  Negative for agitation and dysphoric mood.     Objective:  BP 125/76   Pulse 80   Temp 98.3 F (36.8 C)   Ht 5' 3 (1.6 m)   Wt 224 lb (101.6 kg)   SpO2 93%   BMI 39.68 kg/m   BP Readings from Last 3 Encounters:  06/17/24 125/76  12/11/23 122/75  08/21/23 110/80    Wt Readings from Last 3 Encounters:  06/17/24 224 lb (101.6 kg)  12/11/23 217 lb (98.4 kg)  08/21/23 216 lb (98 kg)     Physical Exam Constitutional:      General: She is not in acute distress.    Appearance: She is well-developed.  HENT:     Head: Normocephalic and atraumatic.  Eyes:     Conjunctiva/sclera: Conjunctivae normal.     Pupils: Pupils are equal, round, and reactive to light.  Neck:     Thyroid : No thyromegaly.  Cardiovascular:     Rate and Rhythm: Normal rate and regular rhythm.     Heart sounds: Normal heart sounds. No murmur heard. Pulmonary:     Effort: Pulmonary effort is normal. No respiratory distress.     Breath sounds: Normal breath sounds. No wheezing or rales.  Abdominal:     General: Bowel sounds are normal. There is no distension.     Palpations: Abdomen is soft.     Tenderness: There is no abdominal tenderness.  Musculoskeletal:        General: Normal range of motion.     Cervical back: Normal range of motion and neck supple.  Lymphadenopathy:      Cervical: No cervical adenopathy.  Skin:    General: Skin is warm and dry.  Neurological:     Mental Status: She is alert and oriented to person, place, and time.  Psychiatric:        Behavior: Behavior normal.        Thought Content: Thought content normal.        Judgment: Judgment normal.      Assessment & Plan:  Well adult exam -     CBC with Differential/Platelet -     CMP14+EGFR  Intrinsic eczema -     CBC with Differential/Platelet -     CMP14+EGFR  Mixed hyperlipidemia -     Lipid panel -     CBC with Differential/Platelet -     CMP14+EGFR  Obesity (BMI 35.0-39.9 without comorbidity) -     CBC with Differential/Platelet -     CMP14+EGFR  Other specified hypothyroidism -  TSH + free T4 -     CBC with Differential/Platelet -     CMP14+EGFR -     Urinalysis  Age-related osteoporosis without current pathological fracture -     CBC with Differential/Platelet -     CMP14+EGFR  Iron deficiency anemia, unspecified iron deficiency anemia type -     CBC with Differential/Platelet -     CMP14+EGFR  Restless leg syndrome -     CBC with Differential/Platelet -     CMP14+EGFR -     Urinalysis  Sleep apnea, obstructive -     CBC with Differential/Platelet -     CMP14+EGFR  Other orders -     Fluocinolone  Acetonide; Dribble 2-4 drops in affected ear 3 times a day until sx clear  Dispense: 60 mL; Refill: 5    Assessment and Plan Assessment & Plan Adult Wellness Visit   Routine adult wellness visit shows well-controlled blood pressure without medication and no new symptoms or concerns. Physical exam reveals no significant findings. Perform blood work including cholesterol and thyroid  levels.  Obesity   Obesity management focuses on dietary habits. She consumes salads but may use high-calorie dressings. Encourage a 10-20% reduction in calorie intake and a 10-20% increase in physical activity. Avoid high-calorie salad dressings and toppings. Consider whole  grain rice instead of white rice.  Hyperlipidemia   Hyperlipidemia is managed with atorvastatin , which reduces the risk of heart attack by more than half. Continue atorvastatin  and perform blood work to check cholesterol levels.  Hypothyroidism   Perform blood work to check thyroid  levels.  Chronic right-sided heart failure   Monitor for symptoms such as fainting or lightheadedness and contact the heart nurse if she occurs.  Edema   Edema is managed with furosemide . She reports minimal swelling and takes medication on non-working days. Continue furosemide  as needed and try half dose on working days if swelling occurs. Avoid salt intake.  Osteoporosis   Osteoporosis is managed with alendronate  and vitamin D . No issues with medication adherence or side effects. Continue alendronate  and vitamin D  weekly. Ensure adequate calcium  intake through diet and plan a bone density test in February.  Restless legs syndrome   Restless legs syndrome is managed with ropinirole , and she reports improvement with medication. Continue ropinirole .  Obstructive sleep apnea   Obstructive sleep apnea is managed with CPAP. She reports good adherence and improved energy levels. Continue CPAP use.  Eczema of external ear canal   Eczema in the external ear canal causes itching. Prescribe ear drops for eczema and instruct on application: 2-4 drops per ear, 3 times a day, reducing frequency as symptoms improve.       Follow-up: Return in about 6 months (around 12/15/2024) for Hypothyroidism.  Butler Der, M.D.

## 2024-06-18 LAB — CMP14+EGFR
ALT: 20 IU/L (ref 0–32)
AST: 19 IU/L (ref 0–40)
Albumin: 4.4 g/dL (ref 3.9–4.9)
Alkaline Phosphatase: 75 IU/L (ref 49–135)
BUN/Creatinine Ratio: 55 — ABNORMAL HIGH (ref 12–28)
BUN: 28 mg/dL — ABNORMAL HIGH (ref 8–27)
Bilirubin Total: 0.3 mg/dL (ref 0.0–1.2)
CO2: 23 mmol/L (ref 20–29)
Calcium: 9.8 mg/dL (ref 8.7–10.3)
Chloride: 106 mmol/L (ref 96–106)
Creatinine, Ser: 0.51 mg/dL — ABNORMAL LOW (ref 0.57–1.00)
Globulin, Total: 2.3 g/dL (ref 1.5–4.5)
Glucose: 110 mg/dL — ABNORMAL HIGH (ref 70–99)
Potassium: 4.5 mmol/L (ref 3.5–5.2)
Sodium: 148 mmol/L — ABNORMAL HIGH (ref 134–144)
Total Protein: 6.7 g/dL (ref 6.0–8.5)
eGFR: 105 mL/min/1.73 (ref >59)

## 2024-06-18 LAB — CBC WITH DIFFERENTIAL/PLATELET
Basophils Absolute: 0.1 x10E3/uL (ref 0.0–0.2)
Basos: 1 %
EOS (ABSOLUTE): 0.2 x10E3/uL (ref 0.0–0.4)
Eos: 2 %
Hematocrit: 43.4 % (ref 34.0–46.6)
Hemoglobin: 14.5 g/dL (ref 11.1–15.9)
Immature Grans (Abs): 0 x10E3/uL (ref 0.0–0.1)
Immature Granulocytes: 0 %
Lymphocytes Absolute: 2 x10E3/uL (ref 0.7–3.1)
Lymphs: 23 %
MCH: 31.6 pg (ref 26.6–33.0)
MCHC: 33.4 g/dL (ref 31.5–35.7)
MCV: 95 fL (ref 79–97)
Monocytes Absolute: 0.6 x10E3/uL (ref 0.1–0.9)
Monocytes: 7 %
Neutrophils Absolute: 5.8 x10E3/uL (ref 1.4–7.0)
Neutrophils: 67 %
Platelets: 231 x10E3/uL (ref 150–450)
RBC: 4.59 x10E6/uL (ref 3.77–5.28)
RDW: 11.9 % (ref 11.7–15.4)
WBC: 8.6 x10E3/uL (ref 3.4–10.8)

## 2024-06-18 LAB — LIPID PANEL
Chol/HDL Ratio: 3.9 ratio (ref 0.0–4.4)
Cholesterol, Total: 185 mg/dL (ref 100–199)
HDL: 48 mg/dL (ref >39)
LDL Chol Calc (NIH): 101 mg/dL — ABNORMAL HIGH (ref 0–99)
Triglycerides: 211 mg/dL — ABNORMAL HIGH (ref 0–149)
VLDL Cholesterol Cal: 36 mg/dL (ref 5–40)

## 2024-06-18 LAB — TSH+FREE T4
Free T4: 1.4 ng/dL (ref 0.82–1.77)
TSH: 2.54 u[IU]/mL (ref 0.450–4.500)

## 2024-06-22 ENCOUNTER — Other Ambulatory Visit: Payer: Self-pay | Admitting: Family Medicine

## 2024-06-23 ENCOUNTER — Ambulatory Visit: Payer: Self-pay | Admitting: Family Medicine

## 2024-06-23 NOTE — Progress Notes (Signed)
Hello Makayla Gibson,  Your lab result is normal and/or stable.Some minor variations that are not significant are commonly marked abnormal, but do not represent any medical problem for you.  Best regards, Warren Stacks, M.D.

## 2024-07-03 ENCOUNTER — Other Ambulatory Visit: Payer: Self-pay | Admitting: Family Medicine

## 2024-07-13 ENCOUNTER — Telehealth: Payer: Self-pay | Admitting: Family Medicine

## 2024-07-13 ENCOUNTER — Encounter

## 2024-07-13 ENCOUNTER — Other Ambulatory Visit: Payer: Self-pay | Admitting: Family Medicine

## 2024-07-13 DIAGNOSIS — Z1231 Encounter for screening mammogram for malignant neoplasm of breast: Secondary | ICD-10-CM

## 2024-07-13 NOTE — Telephone Encounter (Signed)
 FYI  Copied from CRM [id number]. Topic: Appointments - Appointment Cancel/Reschedule >> Jul 13, 2024  9:12 AM Makayla Gibson wrote: Patient  to cancel  a mammogram appointment. Refer to attachments for appointment information.  [phone] Patient called to cancel mammogram apt she stated she will call back another day and reschedule

## 2024-07-19 ENCOUNTER — Other Ambulatory Visit: Payer: Self-pay | Admitting: Family Medicine

## 2024-08-04 ENCOUNTER — Other Ambulatory Visit: Payer: Self-pay | Admitting: Family Medicine

## 2024-08-04 DIAGNOSIS — R600 Localized edema: Secondary | ICD-10-CM

## 2024-08-18 ENCOUNTER — Other Ambulatory Visit: Payer: Self-pay | Admitting: *Deleted

## 2024-12-15 ENCOUNTER — Ambulatory Visit: Payer: Self-pay | Admitting: Family Medicine
